# Patient Record
Sex: Female | Born: 1950
Health system: Southern US, Community
[De-identification: ages and names within clinical notes are randomized; demographics above are authoritative.]

## PROBLEM LIST (undated history)

## (undated) DIAGNOSIS — O009 Unspecified ectopic pregnancy without intrauterine pregnancy: Secondary | ICD-10-CM

## (undated) DIAGNOSIS — R569 Unspecified convulsions: Secondary | ICD-10-CM

## (undated) DIAGNOSIS — F419 Anxiety disorder, unspecified: Secondary | ICD-10-CM

## (undated) DIAGNOSIS — S62609A Fracture of unspecified phalanx of unspecified finger, initial encounter for closed fracture: Secondary | ICD-10-CM

## (undated) DIAGNOSIS — F32A Depression, unspecified: Secondary | ICD-10-CM

## (undated) DIAGNOSIS — B1920 Unspecified viral hepatitis C without hepatic coma: Secondary | ICD-10-CM

## (undated) DIAGNOSIS — K219 Gastro-esophageal reflux disease without esophagitis: Secondary | ICD-10-CM

## (undated) DIAGNOSIS — I10 Essential (primary) hypertension: Secondary | ICD-10-CM

## (undated) DIAGNOSIS — D649 Anemia, unspecified: Secondary | ICD-10-CM

## (undated) DIAGNOSIS — J309 Allergic rhinitis, unspecified: Secondary | ICD-10-CM

## (undated) DIAGNOSIS — Z5189 Encounter for other specified aftercare: Secondary | ICD-10-CM

## (undated) DIAGNOSIS — E039 Hypothyroidism, unspecified: Secondary | ICD-10-CM

## (undated) HISTORY — DX: Anemia, unspecified: D64.9

## (undated) HISTORY — PX: APPENDECTOMY: SHX54

## (undated) HISTORY — PX: CHOLECYSTECTOMY: SHX55

## (undated) HISTORY — PX: BRAIN SURGERY: SHX531

## (undated) HISTORY — DX: Gastro-esophageal reflux disease without esophagitis: K21.9

## (undated) HISTORY — PX: BREAST ENHANCEMENT SURGERY: SHX7

## (undated) HISTORY — DX: Encounter for other specified aftercare: Z51.89

## (undated) HISTORY — PX: TONSILLECTOMY AND ADENOIDECTOMY: SUR1326

## (undated) HISTORY — PX: ECTOPIC PREGNANCY SURGERY: SHX613

## (undated) HISTORY — DX: Allergic rhinitis, unspecified: J30.9

## (undated) HISTORY — DX: Essential (primary) hypertension: I10

## (undated) HISTORY — DX: Unspecified viral hepatitis C without hepatic coma: B19.20

## (undated) HISTORY — PX: LUMBAR SPINE SURGERY: SHX701

---

## 1998-02-25 ENCOUNTER — Other Ambulatory Visit: Admission: RE | Admit: 1998-02-25 | Discharge: 1998-02-25 | Payer: Self-pay | Admitting: Obstetrics and Gynecology

## 1999-06-25 ENCOUNTER — Emergency Department (HOSPITAL_COMMUNITY): Admission: EM | Admit: 1999-06-25 | Discharge: 1999-06-25 | Payer: Self-pay | Admitting: Emergency Medicine

## 2000-06-04 ENCOUNTER — Encounter: Payer: Self-pay | Admitting: Internal Medicine

## 2000-06-04 ENCOUNTER — Encounter: Admission: RE | Admit: 2000-06-04 | Discharge: 2000-06-04 | Payer: Self-pay | Admitting: Internal Medicine

## 2011-03-31 ENCOUNTER — Other Ambulatory Visit: Payer: Self-pay | Admitting: Family Medicine

## 2011-03-31 DIAGNOSIS — Z780 Asymptomatic menopausal state: Secondary | ICD-10-CM

## 2011-03-31 DIAGNOSIS — Z1231 Encounter for screening mammogram for malignant neoplasm of breast: Secondary | ICD-10-CM

## 2011-04-09 ENCOUNTER — Ambulatory Visit
Admission: RE | Admit: 2011-04-09 | Discharge: 2011-04-09 | Disposition: A | Discharge status: Home / Self Care | Payer: Medicaid Other | Source: Ambulatory Visit | Attending: Family Medicine | Admitting: Family Medicine

## 2011-04-09 ENCOUNTER — Other Ambulatory Visit: Payer: Self-pay | Admitting: Family Medicine

## 2011-04-09 DIAGNOSIS — Z780 Asymptomatic menopausal state: Secondary | ICD-10-CM

## 2011-04-09 DIAGNOSIS — Z1231 Encounter for screening mammogram for malignant neoplasm of breast: Secondary | ICD-10-CM

## 2013-02-21 ENCOUNTER — Telehealth: Payer: Self-pay | Admitting: Family Medicine

## 2013-02-21 DIAGNOSIS — R569 Unspecified convulsions: Secondary | ICD-10-CM

## 2013-02-21 MED ORDER — CARBAMAZEPINE ER 200 MG PO CP12: 200.0000 mg | ORAL_CAPSULE | Freq: Two times a day (BID) | ORAL | Status: DC

## 2013-02-21 NOTE — Telephone Encounter (Signed)
Medication refilled per protocol.Patient needs to be seen before any further refills 

## 2013-02-27 ENCOUNTER — Telehealth: Payer: Self-pay | Admitting: Family Medicine

## 2013-02-27 MED ORDER — FLUTICASONE PROPIONATE 50 MCG/ACT NA SUSP: 2.0000 | Freq: Every day | NASAL | Status: DC

## 2013-02-27 NOTE — Telephone Encounter (Signed)
rx refilled.

## 2013-03-08 ENCOUNTER — Ambulatory Visit (INDEPENDENT_AMBULATORY_CARE_PROVIDER_SITE_OTHER): Payer: Medicare Other | Admitting: Family Medicine

## 2013-03-08 ENCOUNTER — Encounter: Payer: Self-pay | Admitting: Family Medicine

## 2013-03-08 VITALS — BP 136/82 | HR 86 | Temp 98.2°F | Resp 18 | Wt 128.0 lb

## 2013-03-08 DIAGNOSIS — B182 Chronic viral hepatitis C: Secondary | ICD-10-CM | POA: Insufficient documentation

## 2013-03-08 DIAGNOSIS — J40 Bronchitis, not specified as acute or chronic: Secondary | ICD-10-CM

## 2013-03-08 DIAGNOSIS — I10 Essential (primary) hypertension: Secondary | ICD-10-CM | POA: Insufficient documentation

## 2013-03-08 MED ORDER — ALPRAZOLAM 0.25 MG PO TABS: 0.2500 mg | ORAL_TABLET | Freq: Every evening | ORAL | Status: DC | PRN

## 2013-03-08 MED ORDER — LEVOFLOXACIN 500 MG PO TABS: 500.0000 mg | ORAL_TABLET | Freq: Every day | ORAL | Status: DC

## 2013-03-08 NOTE — Progress Notes (Signed)
Subjective:     Patient ID: Catherine Gonzalez, female   DOB: [birth year], 62 y.o.   MRN: [id number]  HPI  Patient reports 2 weeks of worsening cough productive of green sputum. She is reporting increasing shortness of breath, pleurisy, fevers chills generalized malaise and fatigue.  She denies wheezing. She denies angina. She smokes one pack of cigarettes per day. She has no history of COPD. Past Medical History  Diagnosis Date  . Hepatitis C   . Hypertension    Past medical history is also significant for seizure disorder Current Outpatient Prescriptions on File Prior to Visit  Medication Sig Dispense Refill  . amLODipine-olmesartan (AZOR) 5-20 MG per tablet Take 1 tablet by mouth daily.      . carbamazepine (CARBATROL) 200 MG 12 hr capsule Take 1 capsule (200 mg total) by mouth 2 (two) times daily. Takes two tabs BID  120 capsule  0  . fluticasone (FLONASE) 50 MCG/ACT nasal spray Place 2 sprays into the nose daily.  16 g  11  . levETIRAcetam (KEPPRA) 750 MG tablet Take 750 mg by mouth every 12 (twelve) hours.       No current facility-administered medications on file prior to visit.    Review of Systems    review of systems is otherwise negative Objective:   Physical Exam  Constitutional: She appears well-developed and well-nourished.  HENT:  Head: Normocephalic.  Right Ear: External ear normal.  Left Ear: External ear normal.  Eyes: Conjunctivae and EOM are normal. Pupils are equal, round, and reactive to light.  Neck: Normal range of motion. No JVD present.  Cardiovascular: Normal rate, regular rhythm and normal heart sounds.   No murmur heard. Pulmonary/Chest: Effort normal and breath sounds normal. No respiratory distress. She has no wheezes. She has no rales.  Lymphadenopathy:    She has no cervical adenopathy.       Assessment:     Bronchitis in a smoker.    Plan:    encourage smoking cessation. Begin Levaquin 500 mg by mouth daily for 7 days followup in one week  if no better. Return immediately if worsening.

## 2013-03-15 ENCOUNTER — Other Ambulatory Visit: Payer: Self-pay | Admitting: Family Medicine

## 2013-03-15 ENCOUNTER — Telehealth: Payer: Self-pay | Admitting: Family Medicine

## 2013-03-15 NOTE — Telephone Encounter (Signed)
Pt wants to know if she can get another dose of Levofloxocin 500mg , says feels a little bit better but thinks if she gets 5 more pills it may not it out. Can we call in another dose?

## 2013-03-16 ENCOUNTER — Other Ambulatory Visit: Payer: Self-pay | Admitting: Family Medicine

## 2013-03-16 MED ORDER — LEVOFLOXACIN 500 MG PO TABS: 500.0000 mg | ORAL_TABLET | Freq: Every day | ORAL | Status: DC

## 2013-03-16 NOTE — Telephone Encounter (Signed)
Script rf and pt aware

## 2013-03-16 NOTE — Telephone Encounter (Signed)
Please call out 5 additional days.

## 2013-03-16 NOTE — Telephone Encounter (Addendum)
Refilled script as requested and pt aware

## 2013-03-16 NOTE — Addendum Note (Signed)
Addended by: Elvina Mattes T on: 03/16/2013 12:09 PM   Modules accepted: Orders

## 2013-03-19 ENCOUNTER — Emergency Department (HOSPITAL_COMMUNITY): Payer: Medicare Other

## 2013-03-19 ENCOUNTER — Emergency Department (HOSPITAL_COMMUNITY)
Admission: EM | Admit: 2013-03-19 | Discharge: 2013-03-19 | Disposition: A | Discharge status: Home / Self Care | Payer: Medicare Other | Source: Home / Self Care | Attending: Emergency Medicine | Admitting: Emergency Medicine

## 2013-03-19 ENCOUNTER — Encounter (HOSPITAL_COMMUNITY): Payer: Self-pay | Admitting: *Deleted

## 2013-03-19 ENCOUNTER — Other Ambulatory Visit: Payer: Self-pay

## 2013-03-19 DIAGNOSIS — F17200 Nicotine dependence, unspecified, uncomplicated: Secondary | ICD-10-CM | POA: Insufficient documentation

## 2013-03-19 DIAGNOSIS — Z9889 Other specified postprocedural states: Secondary | ICD-10-CM | POA: Insufficient documentation

## 2013-03-19 DIAGNOSIS — R42 Dizziness and giddiness: Secondary | ICD-10-CM | POA: Insufficient documentation

## 2013-03-19 DIAGNOSIS — I10 Essential (primary) hypertension: Secondary | ICD-10-CM | POA: Insufficient documentation

## 2013-03-19 DIAGNOSIS — Z8619 Personal history of other infectious and parasitic diseases: Secondary | ICD-10-CM | POA: Insufficient documentation

## 2013-03-19 DIAGNOSIS — Z79899 Other long term (current) drug therapy: Secondary | ICD-10-CM | POA: Insufficient documentation

## 2013-03-19 DIAGNOSIS — Z9089 Acquired absence of other organs: Secondary | ICD-10-CM | POA: Insufficient documentation

## 2013-03-19 DIAGNOSIS — G40909 Epilepsy, unspecified, not intractable, without status epilepticus: Secondary | ICD-10-CM | POA: Insufficient documentation

## 2013-03-19 DIAGNOSIS — R109 Unspecified abdominal pain: Secondary | ICD-10-CM | POA: Insufficient documentation

## 2013-03-19 DIAGNOSIS — IMO0002 Reserved for concepts with insufficient information to code with codable children: Secondary | ICD-10-CM | POA: Insufficient documentation

## 2013-03-19 DIAGNOSIS — R55 Syncope and collapse: Secondary | ICD-10-CM | POA: Insufficient documentation

## 2013-03-19 DIAGNOSIS — R300 Dysuria: Secondary | ICD-10-CM | POA: Insufficient documentation

## 2013-03-19 HISTORY — DX: Unspecified ectopic pregnancy without intrauterine pregnancy: O00.90

## 2013-03-19 HISTORY — DX: Unspecified convulsions: R56.9

## 2013-03-19 LAB — CBC WITH DIFFERENTIAL/PLATELET
Basophils Absolute: 0 10*3/uL (ref 0.0–0.1)
Eosinophils Relative: 1 % (ref 0–5)
Lymphocytes Relative: 38 % (ref 12–46)
Lymphs Abs: 1.3 10*3/uL (ref 0.7–4.0)
MCV: 88.9 fL (ref 78.0–100.0)
Neutrophils Relative %: 39 % — ABNORMAL LOW (ref 43–77)
Platelets: 204 10*3/uL (ref 150–400)
RBC: 3.51 MIL/uL — ABNORMAL LOW (ref 3.87–5.11)
RDW: 13.8 % (ref 11.5–15.5)
WBC: 3.5 10*3/uL — ABNORMAL LOW (ref 4.0–10.5)

## 2013-03-19 LAB — URINALYSIS, ROUTINE W REFLEX MICROSCOPIC
Ketones, ur: NEGATIVE mg/dL
Leukocytes, UA: NEGATIVE
Nitrite: NEGATIVE
Protein, ur: NEGATIVE mg/dL
pH: 7 (ref 5.0–8.0)

## 2013-03-19 LAB — POCT I-STAT, CHEM 8
Calcium, Ion: 1.12 mmol/L (ref 1.12–1.23)
Glucose, Bld: 108 mg/dL — ABNORMAL HIGH (ref 70–99)
HCT: 33 % — ABNORMAL LOW (ref 36.0–46.0)
Hemoglobin: 11.2 g/dL — ABNORMAL LOW (ref 12.0–15.0)
Potassium: 3.9 mEq/L (ref 3.5–5.1)

## 2013-03-19 LAB — MAGNESIUM: Magnesium: 1.8 mg/dL (ref 1.5–2.5)

## 2013-03-19 MED ORDER — ONDANSETRON HCL 4 MG/2ML IJ SOLN
4.0000 mg | Freq: Once | INTRAMUSCULAR | Status: AC
  Administered 2013-03-19: 4 mg via INTRAVENOUS
  Filled 2013-03-19: qty 2

## 2013-03-19 MED ORDER — SODIUM CHLORIDE 0.9 % IV BOLUS (SEPSIS)
1000.0000 mL | Freq: Once | INTRAVENOUS | Status: AC
  Administered 2013-03-19: 1000 mL via INTRAVENOUS

## 2013-03-19 MED ORDER — IOHEXOL 300 MG/ML  SOLN
50.0000 mL | Freq: Once | INTRAMUSCULAR | Status: AC | PRN
  Administered 2013-03-19: 50 mL via ORAL

## 2013-03-19 MED ORDER — SODIUM CHLORIDE 0.9 % IV SOLN
Freq: Once | INTRAVENOUS | Status: AC
  Administered 2013-03-19: 13:00:00 via INTRAVENOUS

## 2013-03-19 MED ORDER — IOHEXOL 300 MG/ML  SOLN
100.0000 mL | Freq: Once | INTRAMUSCULAR | Status: AC | PRN
  Administered 2013-03-19: 100 mL via INTRAVENOUS

## 2013-03-19 MED ORDER — ONDANSETRON 8 MG PO TBDP: 8.0000 mg | ORAL_TABLET | Freq: Three times a day (TID) | ORAL | Status: DC | PRN

## 2013-03-19 MED ORDER — ACETAMINOPHEN-CODEINE #3 300-30 MG PO TABS: 1.0000 | ORAL_TABLET | Freq: Four times a day (QID) | ORAL | Status: DC | PRN

## 2013-03-19 NOTE — ED Notes (Signed)
Patient unable to void at this time

## 2013-03-19 NOTE — ED Notes (Signed)
Pt drinking oral contrast. Tolerating well.  

## 2013-03-19 NOTE — ED Notes (Signed)
Pt reports Bronchitis 3 wks ago, being prescribed antibiotic which she is continuing to take; pain has progressed from chest down to ABD; pt reports being diagnosed w/ hepatitis C 1 year ago; pt reports pain and burning on urination;

## 2013-03-19 NOTE — ED Notes (Signed)
Returned from CT.

## 2013-03-19 NOTE — ED Notes (Signed)
MD at bedside. 

## 2013-03-19 NOTE — ED Provider Notes (Addendum)
History     CSN: [id number]  Arrival date & time 03/19/13  1130   First MD Initiated Contact with Patient 03/19/13 1137      Chief Complaint  Patient presents with  . Abdominal Pain  . Loss of Consciousness    (Consider location/radiation/quality/duration/timing/severity/associated sxs/prior treatment) HPI Comments: Pt with hx of hepc, htn, seizures comes in with cc of abd pain. Pt has abd pain in the lower quadrants x 3 weeks, non radiating. No associated n/v/f/c/diarrhea. Pt does have some dysuria x 3 weeks, but no hematuria, polyuria. Pt denies any hx of renal stones. No hx of PUD. No hx of same complains in the past. Does endorse to smoking at least 1 ppd since teens.  Pt also had a near syncopal episode x 2 on Friday. She got up, and just "went down" without losing consciousness. No palpitations, no chest pains, dib prior to those sx. No blood loss. No hx of CAD. No hx of PE, substance abuse.  Patient is a 62 y.o. female presenting with abdominal pain and syncope. The history is provided by the patient.  Abdominal Pain Associated symptoms: no chest pain, no dysuria, no fever, no nausea, no shortness of breath and no vomiting   Loss of Consciousness  Associated symptoms include abdominal pain and dizziness. Pertinent negatives include chest pain, confusion, fever, headaches, nausea, vomiting and weakness.    Past Medical History  Diagnosis Date  . Hepatitis C   . Hypertension   . Seizures   . Ectopic pregnancy     Past Surgical History  Procedure Laterality Date  . Brain surgery    . Appendectomy    . Cholecystectomy    . Back surgery      Family History  Problem Relation Age of Onset  . Heart failure Mother   . Cancer Father     History  Substance Use Topics  . Smoking status: Current Every Day Smoker -- 1.00 packs/day    Types: Cigarettes  . Smokeless tobacco: Not on file  . Alcohol Use: No    OB History   Grav Para Term Preterm Abortions TAB SAB Ect  Mult Living   1    1   1         Review of Systems  Constitutional: Negative for fever and activity change.  HENT: Negative for facial swelling and neck pain.   Respiratory: Negative for shortness of breath and wheezing.   Cardiovascular: Positive for syncope. Negative for chest pain.  Gastrointestinal: Positive for abdominal pain. Negative for nausea, vomiting, blood in stool and abdominal distention.  Genitourinary: Negative for dysuria and difficulty urinating.  Skin: Negative for color change.  Neurological: Positive for dizziness. Negative for syncope, speech difficulty, weakness and headaches.  Hematological: Does not bruise/bleed easily.  Psychiatric/Behavioral: Negative for confusion.    Allergies  Review of patient's allergies indicates no known allergies.  Home Medications   Current Outpatient Rx  Name  Route  Sig  Dispense  Refill  . ALPRAZolam (XANAX) 0.25 MG tablet   Oral   Take 1 tablet (0.25 mg total) by mouth at bedtime as needed for sleep.   20 tablet   0   . amLODipine-olmesartan (AZOR) 5-20 MG per tablet   Oral   Take 1 tablet by mouth daily.         . Boceprevir 200 MG CAPS   Oral   Take 4 capsules by mouth 3 (three) times daily.          Marland Kitchen  carbamazepine (CARBATROL) 200 MG 12 hr capsule   Oral   Take 1 capsule (200 mg total) by mouth 2 (two) times daily. Takes two tabs BID   120 capsule   0     Patient needs to be seen before any further refill ...   . fluticasone (FLONASE) 50 MCG/ACT nasal spray   Nasal   Place 2 sprays into the nose daily as needed for allergies.         Marland Kitchen levETIRAcetam (KEPPRA) 750 MG tablet   Oral   Take 750 mg by mouth every 12 (twelve) hours.         Marland Kitchen levofloxacin (LEVAQUIN) 500 MG tablet   Oral   Take 1 tablet (500 mg total) by mouth daily.   5 tablet   0   . peginterferon alfa-2a (PEGASYS) 180 MCG/ML injection   Subcutaneous   Inject 180 mcg into the skin every 7 (seven) days.         . ribavirin  (RIBATAB) 400 MG tablet   Oral   Take 400 mg by mouth 2 (two) times daily.            BP 121/75  Pulse 81  Temp(Src) 98.8 F (37.1 C) (Oral)  Ht 5\' 6"  (1.676 m)  Wt 120 lb (54.432 kg)  BMI 19.38 kg/m2  SpO2 98%  Physical Exam  Nursing note and vitals reviewed. Constitutional: She is oriented to person, place, and time. She appears well-developed and well-nourished.  HENT:  Head: Normocephalic and atraumatic.  Eyes: EOM are normal. Pupils are equal, round, and reactive to light.  Neck: Neck supple.  Cardiovascular: Normal rate, regular rhythm and normal heart sounds.   No murmur heard. Pulmonary/Chest: Effort normal. No respiratory distress.  Abdominal: Soft. She exhibits no distension and no mass. There is tenderness. There is no rebound and no guarding.  Diffuse lower quadrant tenderness  Neurological: She is alert and oriented to person, place, and time.  Skin: Skin is warm and dry.    ED Course  Procedures (including critical care time)  Labs Reviewed  CBC WITH DIFFERENTIAL - Abnormal; Notable for the following:    WBC 3.5 (*)    RBC 3.51 (*)    Hemoglobin 10.8 (*)    HCT 31.2 (*)    Neutrophils Relative 39 (*)    Neutro Abs 1.4 (*)    Monocytes Relative 22 (*)    All other components within normal limits  POCT I-STAT, CHEM 8 - Abnormal; Notable for the following:    Sodium 129 (*)    Chloride 93 (*)    BUN 5 (*)    Glucose, Bld 108 (*)    Hemoglobin 11.2 (*)    HCT 33.0 (*)    All other components within normal limits  MAGNESIUM  URINALYSIS, ROUTINE W REFLEX MICROSCOPIC   No results found.   No diagnosis found.    MDM   Pt comes in with cc of lower quadrant abd pain.  DDx includes: Pancreatitis Hepatobiliary pathology including cholecystitis Gastritis/PUD SBO ACS syndrome Aortic Dissection AAA Tumors Colitis Intra abdominal abscess Thrombosis Mesenteric  ischemia Diverticulitis Peritonitis Appendicitis Hernia Nephrolithiasis Pyelonephritis UTI/Cystitis  Pt comes in with abd pain, diffuse x 3 weeks, and near syncope.  Plan is to r/o AAA. Pt has extensive smoking hx. Pt has no peritoneal findings, but CT will be ordered to r/o any infectious process - and also screen for AAA in the process.  The near syncope - appears to  be orthostatic. The episode was on Friday, no repeat offense. No premature CAd in the family and she has no hx of CHF. We will get a screening EKG. Although she comes beyond 24 hours of her presentation  - and is not a candidate for SF syncope rule - she would have been discharged based on that criteria as well.    Date: 03/19/2013  Rate: 83  Rhythm: normal sinus rhythm  QRS Axis: normal  Intervals: normal  ST/T Wave abnormalities: nonspecific ST/T changes  Conduction Disutrbances:none  Narrative Interpretation:   Old EKG Reviewed: none available        Derwood Kaplan, MD 03/19/13 1510  Derwood Kaplan, MD 03/19/13 1512

## 2013-03-20 ENCOUNTER — Emergency Department (HOSPITAL_COMMUNITY): Payer: Medicare Other

## 2013-03-20 ENCOUNTER — Inpatient Hospital Stay (HOSPITAL_COMMUNITY)
Admission: EM | Admit: 2013-03-20 | Discharge: 2013-03-21 | DRG: 312 | Disposition: A | Discharge status: Home / Self Care | Payer: Medicare Other | Attending: Internal Medicine | Admitting: Internal Medicine

## 2013-03-20 ENCOUNTER — Inpatient Hospital Stay (HOSPITAL_COMMUNITY)
Admit: 2013-03-20 | Discharge: 2013-03-20 | Disposition: A | Discharge status: Home / Self Care | Payer: Medicare Other | Attending: Internal Medicine | Admitting: Internal Medicine

## 2013-03-20 ENCOUNTER — Encounter (HOSPITAL_COMMUNITY): Payer: Self-pay | Admitting: *Deleted

## 2013-03-20 DIAGNOSIS — E876 Hypokalemia: Secondary | ICD-10-CM | POA: Diagnosis present

## 2013-03-20 DIAGNOSIS — R55 Syncope and collapse: Principal | ICD-10-CM | POA: Diagnosis present

## 2013-03-20 DIAGNOSIS — F411 Generalized anxiety disorder: Secondary | ICD-10-CM | POA: Diagnosis present

## 2013-03-20 DIAGNOSIS — E871 Hypo-osmolality and hyponatremia: Secondary | ICD-10-CM | POA: Diagnosis present

## 2013-03-20 DIAGNOSIS — R9401 Abnormal electroencephalogram [EEG]: Secondary | ICD-10-CM | POA: Diagnosis present

## 2013-03-20 DIAGNOSIS — G40909 Epilepsy, unspecified, not intractable, without status epilepticus: Secondary | ICD-10-CM | POA: Diagnosis present

## 2013-03-20 DIAGNOSIS — I10 Essential (primary) hypertension: Secondary | ICD-10-CM | POA: Diagnosis present

## 2013-03-20 DIAGNOSIS — B182 Chronic viral hepatitis C: Secondary | ICD-10-CM | POA: Diagnosis present

## 2013-03-20 LAB — CBC WITH DIFFERENTIAL/PLATELET
Basophils Relative: 0 % (ref 0–1)
Hemoglobin: 10.1 g/dL — ABNORMAL LOW (ref 12.0–15.0)
Lymphs Abs: 1.1 10*3/uL (ref 0.7–4.0)
MCHC: 35.1 g/dL (ref 30.0–36.0)
Monocytes Relative: 16 % — ABNORMAL HIGH (ref 3–12)
Neutro Abs: 1.6 10*3/uL — ABNORMAL LOW (ref 1.7–7.7)
Neutrophils Relative %: 49 % (ref 43–77)
RBC: 3.24 MIL/uL — ABNORMAL LOW (ref 3.87–5.11)

## 2013-03-20 LAB — POCT I-STAT TROPONIN I: Troponin i, poc: 0 ng/mL (ref 0.00–0.08)

## 2013-03-20 LAB — RAPID URINE DRUG SCREEN, HOSP PERFORMED
Opiates: POSITIVE — AB
Tetrahydrocannabinol: NOT DETECTED

## 2013-03-20 LAB — BASIC METABOLIC PANEL
BUN: 5 mg/dL — ABNORMAL LOW (ref 6–23)
BUN: 4 mg/dL — ABNORMAL LOW (ref 6–23)
Chloride: 92 mEq/L — ABNORMAL LOW (ref 96–112)
Chloride: 97 mEq/L (ref 96–112)
Creatinine, Ser: 0.58 mg/dL (ref 0.50–1.10)
GFR calc Af Amer: >90 mL/min (ref >90)
GFR calc Af Amer: >90 mL/min (ref >90)
GFR calc non Af Amer: >90 mL/min (ref >90)
Potassium: 3.4 mEq/L — ABNORMAL LOW (ref 3.5–5.1)
Potassium: 3.6 mEq/L (ref 3.5–5.1)

## 2013-03-20 LAB — HEPATIC FUNCTION PANEL
ALT: 28 U/L (ref 0–35)
Albumin: 3.7 g/dL (ref 3.5–5.2)
Bilirubin, Direct: <0.1 mg/dL (ref 0.0–0.3)
Total Bilirubin: 0.3 mg/dL (ref 0.3–1.2)
Total Protein: 6.9 g/dL (ref 6.0–8.3)

## 2013-03-20 LAB — CARBAMAZEPINE LEVEL, TOTAL: Carbamazepine Lvl: 8.3 ug/mL (ref 4.0–12.0)

## 2013-03-20 LAB — URINALYSIS, ROUTINE W REFLEX MICROSCOPIC
Bilirubin Urine: NEGATIVE
Nitrite: NEGATIVE
Specific Gravity, Urine: 1.016 (ref 1.005–1.030)
pH: 7 (ref 5.0–8.0)

## 2013-03-20 LAB — TSH: TSH: 33.823 u[IU]/mL — ABNORMAL HIGH (ref 0.350–4.500)

## 2013-03-20 LAB — OCCULT BLOOD, POC DEVICE: Fecal Occult Bld: NEGATIVE

## 2013-03-20 LAB — LIPASE, BLOOD: Lipase: 23 U/L (ref 11–59)

## 2013-03-20 LAB — URINE MICROSCOPIC-ADD ON

## 2013-03-20 LAB — OSMOLALITY: Osmolality: 265 mOsm/kg — ABNORMAL LOW (ref 275–300)

## 2013-03-20 LAB — OSMOLALITY, URINE: Osmolality, Ur: 301 mOsm/kg — ABNORMAL LOW (ref 390–1090)

## 2013-03-20 LAB — TROPONIN I: Troponin I: <0.3 ng/mL (ref <0.30)

## 2013-03-20 LAB — MAGNESIUM: Magnesium: 1.9 mg/dL (ref 1.5–2.5)

## 2013-03-20 MED ORDER — SODIUM CHLORIDE 0.9 % IV BOLUS (SEPSIS)
1000.0000 mL | Freq: Once | INTRAVENOUS | Status: AC
  Administered 2013-03-20: 1000 mL via INTRAVENOUS

## 2013-03-20 MED ORDER — ALPRAZOLAM 0.25 MG PO TABS
0.2500 mg | ORAL_TABLET | Freq: Every evening | ORAL | Status: DC | PRN
  Administered 2013-03-20: 0.25 mg via ORAL
  Filled 2013-03-20: qty 1

## 2013-03-20 MED ORDER — BOCEPREVIR 200 MG PO CAPS
4.0000 | ORAL_CAPSULE | Freq: Three times a day (TID) | ORAL | Status: DC
  Administered 2013-03-20 – 2013-03-21 (×3): 800 mg via ORAL

## 2013-03-20 MED ORDER — ENOXAPARIN SODIUM 40 MG/0.4ML ~~LOC~~ SOLN
40.0000 mg | SUBCUTANEOUS | Status: DC
  Administered 2013-03-20: 40 mg via SUBCUTANEOUS
  Filled 2013-03-20 (×2): qty 0.4

## 2013-03-20 MED ORDER — ACETAMINOPHEN 325 MG PO TABS: 650.0000 mg | ORAL_TABLET | Freq: Four times a day (QID) | ORAL | Status: DC | PRN

## 2013-03-20 MED ORDER — ONDANSETRON HCL 4 MG/2ML IJ SOLN
4.0000 mg | Freq: Four times a day (QID) | INTRAMUSCULAR | Status: DC | PRN
  Administered 2013-03-20: 4 mg via INTRAVENOUS
  Filled 2013-03-20: qty 2

## 2013-03-20 MED ORDER — ONDANSETRON HCL 4 MG/2ML IJ SOLN
4.0000 mg | Freq: Once | INTRAMUSCULAR | Status: AC
  Administered 2013-03-20: 4 mg via INTRAVENOUS
  Filled 2013-03-20: qty 2

## 2013-03-20 MED ORDER — RIBAVIRIN 400 MG PO TABS: 400.0000 mg | ORAL_TABLET | Freq: Two times a day (BID) | ORAL | Status: DC

## 2013-03-20 MED ORDER — POTASSIUM CHLORIDE CRYS ER 20 MEQ PO TBCR
20.0000 meq | EXTENDED_RELEASE_TABLET | Freq: Once | ORAL | Status: AC
  Administered 2013-03-20: 20 meq via ORAL
  Filled 2013-03-20: qty 1

## 2013-03-20 MED ORDER — ACETAMINOPHEN 650 MG RE SUPP: 650.0000 mg | Freq: Four times a day (QID) | RECTAL | Status: DC | PRN

## 2013-03-20 MED ORDER — SODIUM CHLORIDE 0.9 % IJ SOLN: 3.0000 mL | Freq: Two times a day (BID) | INTRAMUSCULAR | Status: DC

## 2013-03-20 MED ORDER — SODIUM CHLORIDE 0.9 % IV SOLN
INTRAVENOUS | Status: DC
  Administered 2013-03-20 (×2): via INTRAVENOUS

## 2013-03-20 MED ORDER — DOCUSATE SODIUM 100 MG PO CAPS
100.0000 mg | ORAL_CAPSULE | Freq: Two times a day (BID) | ORAL | Status: DC
  Administered 2013-03-20 – 2013-03-21 (×3): 100 mg via ORAL
  Filled 2013-03-20 (×4): qty 1

## 2013-03-20 MED ORDER — SENNA 8.6 MG PO TABS
2.0000 | ORAL_TABLET | Freq: Every day | ORAL | Status: DC
  Administered 2013-03-20: 17.2 mg via ORAL
  Filled 2013-03-20: qty 2

## 2013-03-20 MED ORDER — LEVETIRACETAM 750 MG PO TABS
750.0000 mg | ORAL_TABLET | Freq: Two times a day (BID) | ORAL | Status: DC
  Administered 2013-03-20 – 2013-03-21 (×3): 750 mg via ORAL
  Filled 2013-03-20 (×4): qty 1

## 2013-03-20 MED ORDER — LEVOFLOXACIN 500 MG PO TABS
500.0000 mg | ORAL_TABLET | Freq: Once | ORAL | Status: AC
  Administered 2013-03-20: 500 mg via ORAL
  Filled 2013-03-20: qty 1

## 2013-03-20 MED ORDER — RIBAVIRIN 200 MG PO CAPS
400.0000 mg | ORAL_CAPSULE | Freq: Two times a day (BID) | ORAL | Status: DC
  Administered 2013-03-20 – 2013-03-21 (×3): 400 mg via ORAL
  Filled 2013-03-20 (×4): qty 2

## 2013-03-20 MED ORDER — ONDANSETRON HCL 4 MG PO TABS: 4.0000 mg | ORAL_TABLET | Freq: Four times a day (QID) | ORAL | Status: DC | PRN

## 2013-03-20 NOTE — Progress Notes (Signed)
   CARE MANAGEMENT NOTE 03/20/2013  Patient:  Catherine Gonzalez,Catherine Gonzalez   Account Number:  [id number]  Date Initiated:  03/20/2013  Documentation initiated by:  Jiles Crocker  Subjective/Objective Assessment:   ADMITTED WITH SYNCOPAL EPISODE     Action/Plan:   PCP: Leo Grosser, MD  LIVES AT HOME WITH SPOUSE; CM FOLLOWING FOR DCP   Anticipated DC Date:  03/23/2013   Anticipated DC Plan:  HOME/SELF CARE      DC Planning Services  CM consult          Status of service:  In process, will continue to follow Medicare Important Message given?  NA - LOS <3 / Initial given by admissions (If response is "NO", the following Medicare IM given date fields will be blank) Per UR Regulation:  Reviewed for med. necessity/level of care/duration of stay Comments:  03/20/2013- B Aloys Hupfer RN,BSN,MHA

## 2013-03-20 NOTE — ED Notes (Signed)
Per pt report: pt reports having 3 syncopal episodes in the last 2 days.  Family reports syncopal episodes lasting only about a minute or two. Pt reports coming to the hospital yesterday to get evaluated for abd pain.  Pt was told she didn't have a bladder infection and sent home.  Pt currently reports feeling short of breath and having a headache rating a 8/10.  Pt also reports having a 3/10 chest pain that is located in her central chest and increases with deep breathing.  Pain in chest does not radiate.

## 2013-03-20 NOTE — Procedures (Signed)
History: 62 yo F with h/o right tumor resection and   Sedation:   Background: The posterior dominant rhythm is slightly disorganized and asymetric, seen on the left but not right. It achieves a maximal frequency of 8 Hz. There is mild generalized theta and delta activity dring wakefullness, and in addtion, more prominent delta over the right side(most prominent at T8, P8). There are occasional sharp waves at C4 > P4.  Photic stimulation: Physiologic driving is absent  EEG Abnormalities: 1) Occasional sharp waves at C4 > P4.  2) Irregular delta activity on the right posterior quadrant.  3) Mild generalized irregular slow activity.  4) Asymmetric PDR L > R  Clinical Interpretation: This abnormal EEG is consistent with a region of potential epileptogenicity in the right centroparietal region and a focal region of cerebral dysfunction in the right posterior quadrant. There was no seizure recorded.   Ritta Slot, MD [facility] [phone]  If 7pm- 7am, please page neurology on call at [phone].

## 2013-03-20 NOTE — Progress Notes (Signed)
EEG completed.

## 2013-03-20 NOTE — ED Notes (Signed)
Patient transported to X-ray 

## 2013-03-20 NOTE — Progress Notes (Signed)
*  PRELIMINARY RESULTS* Echocardiogram 2D Echocardiogram has been performed.  Catherine Gonzalez 03/20/2013, 1:58 PM

## 2013-03-20 NOTE — H&P (Signed)
[facility] History and Physical  Adryanna Friedt BJY:[id number] DOB: [birth year] DOA: 03/20/2013   PCP: Leo Grosser, MD   Chief Complaint: Syncope  HPI:  62 year old female with a history of hypertension, chronic hepatitis C, seizure disorder, benign brain tumor status post resection 20 years ago, and anxiety presents with a syncopal episode last night as the patient was walking from her bedroom to the bathroom. The patient has been complaining of dizziness for the past 3 weeks primarily with changing of positions from a seated to standing position. The patient stated that she felt dizzy upon standing and walking to the bathroom after which the patient had a witnessed syncopal episode by her husband. There was no tonic-clonic activity, and the patient woke up without any lethargy. The episode lasted less than 1 minute. There was no bowel or bladder incontinence. The patient did not bite her tongue. The patient had a similar episode Friday evening with similar symptoms. The patient has been complaining of intermittent chest discomfort with shortness of breath. The patient was diagnosed with "bronchitis" by her primary care physician approximately 2 weeks ago. The patient was given an initial course of 7 days of antibiotics followed by 5 days of Levaquin of which the patient has her final dose today. She states that her breathing and coughing have significantly improved with antibiotics. However she continues to have some shortness of breath at rest which she attributes to some panic attacks. She denies any worsening abdominal or lower extremity edema, orthopnea. The patient denies any fevers, chills, vomiting, diarrhea, headache, hematochezia, melena. The patient came to the emergency department 03/19/2013 with abdominal pain and sent home with Tylenol #3. CT of the abdomen and pelvis on 03/19/2013 was negative for any acute findings.  Regarding the patient's chronic hepatitis C, the patient  has been taking pegylated interferon for approximately 3 months in addition to ribavirin and boceprevir.  She sees GI physician, Dr. Teena Dunk, in [place]. However, the patient has not been taking her oral hepatitis C medications for the past 2-3 days because of nausea. She did receive her interferon dose last Friday which was 3 days ago.  In the emergency department, the patient's orthostatics were negative. The patient was found to have hyponatremia with a sodium of 125, nonacute CT of the brain, normal EKG, and WBC 3.3. Urinalysis showed 3-6 WBCs. Initial troponin was negative.  Assessment/Plan:  Syncope -Appears to be vagally mediated superimposed upon the patient's hypovolemia--clinically, patient appears dry -Patient notes decreased oral intake for the past 3 weeks -Clinical history and not consistent with seizure -Check TSH -Echocardiogram -Place on telemetry -Urine drug screen -Orthostatics negative in the emergency department -Judicious IV fluids -Cycle troponins Hyponatremia -Likely due to the patient's Tegretol -Since the patient has not had a seizure in nearly 2 years I will discontinue the patient's Tegretol as it is contributing to the patient's worsening hyponatremia--the patient was agreeable with this plan as the patient will continue to be monitored while in the hospital -Continue Keppra -Check Tegretol level -Obtain EEG -Urine osmolality, serum osmolarity -TSH, AM cortisol, lipid panel -recheck Na later today to ensure slow correction Abdominal pain -CT abdomen was nonacute -Suspect a degree of constipation -No bowel movement in 4 days -Trial of stool softeners Chronic hepatitis C -Patient received interferon last Friday, 3 days ago -Continue ribavirin and boceprevir--patient states that she has not taken in 2-3 days Hypertension -Hold Azor for now and monitor Hypokalemia -Replete -Check magnesium  Past Medical History  Diagnosis Date  .  Hepatitis C   . Hypertension   . Seizures   . Ectopic pregnancy    Past Surgical History  Procedure Laterality Date  . Brain surgery    . Appendectomy    . Cholecystectomy    . Back surgery    . Breast surgery     Social History:  reports that she has been smoking Cigarettes.  She has been smoking about 0.50 packs per day. She does not have any smokeless tobacco history on file. She reports that she does not drink alcohol or use illicit drugs.   Family History  Problem Relation Age of Onset  . Heart failure Mother   . Cancer Father      No Known Allergies    Prior to Admission medications   Medication Sig Start Date End Date Taking? Authorizing Provider  acetaminophen-codeine (TYLENOL #3) 300-30 MG per tablet Take 1-2 tablets by mouth every 6 (six) hours as needed for pain. 03/19/13  Yes Derwood Kaplan, MD  ALPRAZolam (XANAX) 0.25 MG tablet Take 1 tablet (0.25 mg total) by mouth at bedtime as needed for sleep. 03/08/13  Yes Donita Brooks, MD  amLODipine-olmesartan (AZOR) 5-20 MG per tablet Take 1 tablet by mouth daily.   Yes Historical Provider, MD  Boceprevir 200 MG CAPS Take 4 capsules by mouth 3 (three) times daily.    Yes Historical Provider, MD  carbamazepine (CARBATROL) 200 MG 12 hr capsule Take 1 capsule (200 mg total) by mouth 2 (two) times daily. Takes two tabs BID 02/21/13  Yes Donita Brooks, MD  fluticasone [facility]) 50 MCG/ACT nasal spray Place 2 sprays into the nose daily as needed for allergies. 02/27/13  Yes Donita Brooks, MD  levETIRAcetam (KEPPRA) 750 MG tablet Take 750 mg by mouth every 12 (twelve) hours.   Yes Historical Provider, MD  levofloxacin (LEVAQUIN) 500 MG tablet Take 1 tablet (500 mg total) by mouth daily. 03/16/13  Yes Donita Brooks, MD  ondansetron (ZOFRAN ODT) 8 MG disintegrating tablet Take 1 tablet (8 mg total) by mouth every 8 (eight) hours as needed for nausea. 03/19/13  Yes Ankit Rhunette Croft, MD  peginterferon alfa-2a (PEGASYS) 180 MCG/ML  injection Inject 180 mcg into the skin every 7 (seven) days.   Yes Historical Provider, MD  ribavirin (RIBATAB) 400 MG tablet Take 400 mg by mouth 2 (two) times daily.    Yes Historical Provider, MD    Review of Systems:  Constitutional:  No weight loss, night sweats, Fevers, chills. Head&Eyes: No headache.  No vision loss.  No eye pain or scotoma ENT:  No Difficulty swallowing,Tooth/dental problems,Sore throat,  No ear ache, post nasal drip,  Cardio-vascular:  No Orthopnea, PND, swelling in lower extremities, palpitations  GI:  No  abdominal pain,  vomiting, diarrhea, loss of appetite, hematochezia, melena, heartburn, indigestion, Resp:  Complains of some shortness of breath which has improved with antibiotics recently  No coughing up of blood .No wheezing.No chest wall deformity  Skin:  no rash or lesions.  GU:  nochange in color of urine, no urgency or frequency. No flank pain.  Musculoskeletal:  No joint pain or swelling. No decreased range of motion. No back pain.  Psych:  No change in mood or affect. No depression or anxiety. Neurologic: No headache, no dysesthesia, no focal weakness, no vision loss.   Physical Exam: Filed Vitals:   03/20/13 9528 03/20/13 4132 03/20/13 0644 03/20/13 0814  BP: 130/68 124/80 140/77  129/73  Pulse: 71 72 80 66  Temp:      TempSrc:      Resp:    16  SpO2:    98%   General:  A&O x 3, NAD, nontoxic, pleasant/cooperative Head/Eye: No conjunctival hemorrhage, no icterus, [place]/AT, No nystagmus ENT:  No icterus,  No thrush, , no pharyngeal exudate; upper palate dentures present; dry mucous membranes Neck:  No masses, no lymphadenpathy, no bruits CV:  RRR, no rub, no gallop, no S3 Lung:  CTAB, good air movement, no wheeze, no rhonchi Abdomen: soft/, +BS, nondistended, no peritoneal signs; the floor quite tender to palpation without any peritoneal signs or guarding Ext: No cyanosis, No rashes, No petechiae, No lymphangitis, No edema Neuro:  CNII-XII intact, strength 4/5 in left upper and lower extremities, strength 5/5 right upper and right lower extremity no dysmetria  Labs on Admission:  Basic Metabolic Panel:  Recent Labs Lab 03/19/13 1211 03/19/13 1223 03/20/13 0709  NA  --  129* 125*  K  --  3.9 3.4*  CL  --  93* 92*  CO2  --   --  28  GLUCOSE  --  108* 99  BUN  --  5* 5*  CREATININE  --  0.80 0.59  CALCIUM  --   --  8.7  MG 1.8  --   --    Liver Function Tests:  Recent Labs Lab 03/20/13 0709  AST 43*  ALT 28  ALKPHOS 87  BILITOT 0.3  PROT 6.9  ALBUMIN 3.7    Recent Labs Lab 03/20/13 0709  LIPASE 23   No results found for this basename: AMMONIA,  in the last 168 hours CBC:  Recent Labs Lab 03/19/13 1211 03/19/13 1223 03/20/13 0709  WBC 3.5*  --  3.3*  NEUTROABS 1.4*  --  1.6*  HGB 10.8* 11.2* 10.1*  HCT 31.2* 33.0* 28.8*  MCV 88.9  --  88.9  PLT 204  --  187   Cardiac Enzymes: No results found for this basename: CKTOTAL, CKMB, CKMBINDEX, TROPONINI,  in the last 168 hours BNP: No components found with this basename: POCBNP,  CBG: No results found for this basename: GLUCAP,  in the last 168 hours  Radiological Exams on Admission: Dg Chest 2 View  03/20/2013  *RADIOLOGY REPORT*  Clinical Data: Syncope and dizziness.  CHEST - 2 VIEW  Comparison: None.  Findings: The lungs are clear.  No edema, infiltrate, nodule or pleural fluid is identified.  The heart size is within normal limits.  The bony thorax is unremarkable.  Densely calcified subglandular breast implants are noted bilaterally.  IMPRESSION: No active disease.   Original Report Authenticated By: Irish Lack, M.D.    Ct Head Wo Contrast  03/20/2013  *RADIOLOGY REPORT*  Clinical Data: Syncope.  CT HEAD WITHOUT CONTRAST  Technique:  Contiguous axial images were obtained from the base of the skull through the vertex without contrast.  Comparison: None.  Findings: There is evidence of prior right parietal craniotomy with large  region of the posterior parietal and occipital encephalomalacia present as well as several surgical clips. The brain demonstrates no evidence of hemorrhage, acute infarction, edema, mass effect, extra-axial fluid collection, hydrocephalus or mass lesion.  IMPRESSION: No acute findings.  Prior right parietal craniotomy with a large region of parieto-occipital encephalomalacia related to prior surgery.   Original Report Authenticated By: Irish Lack, M.D.    Ct Abdomen Pelvis W Contrast  03/19/2013  *RADIOLOGY REPORT*  Clinical Data: Diffuse lower  quadrant abdominal pain  CT ABDOMEN AND PELVIS WITH CONTRAST  Technique:  Multidetector CT imaging of the abdomen and pelvis was performed following the standard protocol during bolus administration of intravenous contrast.  Contrast: 100 ml Omnipaque-300 IV  Comparison: None.  Findings: Lung bases are clear.  Bilateral breast augmentation.  Liver, spleen, and pancreas are within normal limits.  Mild thickening of the bilateral adrenal glands, likely reflecting adenomatous hyperplasia.  Status post cholecystectomy. Mild central intrahepatic ductal prominence.  No extrahepatic ductal dilatation.  Kidneys are within normal limits.  No hydronephrosis.  No evidence of bowel obstruction.  Prior appendectomy.  No colonic wall thickening or inflammatory changes.  Atherosclerotic calcifications of the abdominal aorta and branch vessels.  No abdominopelvic ascites.  No suspicious abdominopelvic lymphadenopathy.  Uterus is unremarkable.  No adnexal masses.  Bladder is within normal limits.  Degenerative changes of the lower lumbar spine.  IMPRESSION: No evidence of bowel obstruction.  Prior appendectomy.  No evidence of diverticulitis.  No evidence of abdominal aortic aneurysm.  No CT findings to account for the patient's abdominal pain.   Original Report Authenticated By: Charline Bills, M.D.     EKG: Independently reviewed.--Sinus rhythm, right axis deviation, incomplete  RBBB    Time spent:70 minutes Code Status:   FULL Family Communication:   husband at bedside   Jeri Rawlins, DO  [facility] Pager [phone]  If 7PM-7AM, please contact night-coverage [email or web address] Password [facility] 03/20/2013, 10:06 AM

## 2013-03-20 NOTE — ED Provider Notes (Signed)
History     CSN: [id number]  Arrival date & time 03/20/13  0541   First MD Initiated Contact with Patient 03/20/13 0603      No chief complaint on file.   (Consider location/radiation/quality/duration/timing/severity/associated sxs/prior treatment) HPI  62 year old female with history of hep C, hypertension, seizure and prior surgical history of brain surgery presents for evaluations of syncope. Patient reports she has not been feeling herself for the past several weeks. She's been having nausea worsening affect eating with occasional nonbloody non-bilious vomit. She's been eating and drinking less. She also endorsed some low abdominal discomfort medication burning on urination. She has had 3 separate syncopal episode in the past 2 days. Episodes usually induced when she goes from a sitting addition to a standing position with associated lightheadedness. She also endorsed a mild chest discomfort in the midchest which increases with deep breathing. Pain is nonradiating, lasting only for a few seconds and is not exertional. She denies fever, chills, headache, neck pain, hemoptysis, hematuria, vaginal discharge, or rash. She was seen in the ED yesterday to evaluate for abdominal pain and syncope. Her urine at that time shows no signs of urinary tract infection. She also had an abdominal CT scan to rule out AAA due to significant family history of heart disease. CT scan shows no acute finding. She returned home last night. States she was laying in bed, got to use the bathroom and had another syncopal episode. Her husband states it lasts for less than a minute with no associated confusion afterward. She fell in the bathroom floor, hitting her head and became diaphoretic afterward. Currently denies any significant headache or neck pain. No new numbness or weakness. Patient past surgical history is significant for appendectomy and cholecystectomy.  Past Medical History  Diagnosis Date  . Hepatitis C   .  Hypertension   . Seizures   . Ectopic pregnancy     Past Surgical History  Procedure Laterality Date  . Brain surgery    . Appendectomy    . Cholecystectomy    . Back surgery    . Breast surgery      Family History  Problem Relation Age of Onset  . Heart failure Mother   . Cancer Father     History  Substance Use Topics  . Smoking status: Current Every Day Smoker -- 0.50 packs/day    Types: Cigarettes  . Smokeless tobacco: Not on file  . Alcohol Use: No    OB History   Grav Para Term Preterm Abortions TAB SAB Ect Mult Living   1    1   1         Review of Systems  Constitutional:       10 Systems reviewed and all are negative for acute change except as noted in the HPI.     Allergies  Review of patient's allergies indicates no known allergies.  Home Medications   Current Outpatient Rx  Name  Route  Sig  Dispense  Refill  . acetaminophen-codeine (TYLENOL #3) 300-30 MG per tablet   Oral   Take 1-2 tablets by mouth every 6 (six) hours as needed for pain.   15 tablet   0   . ALPRAZolam (XANAX) 0.25 MG tablet   Oral   Take 1 tablet (0.25 mg total) by mouth at bedtime as needed for sleep.   20 tablet   0   . amLODipine-olmesartan (AZOR) 5-20 MG per tablet   Oral   Take 1 tablet by  mouth daily.         . Boceprevir 200 MG CAPS   Oral   Take 4 capsules by mouth 3 (three) times daily.          . carbamazepine (CARBATROL) 200 MG 12 hr capsule   Oral   Take 1 capsule (200 mg total) by mouth 2 (two) times daily. Takes two tabs BID   120 capsule   0     Patient needs to be seen before any further refill ...   . fluticasone (FLONASE) 50 MCG/ACT nasal spray   Nasal   Place 2 sprays into the nose daily as needed for allergies.         Marland Kitchen levETIRAcetam (KEPPRA) 750 MG tablet   Oral   Take 750 mg by mouth every 12 (twelve) hours.         Marland Kitchen levofloxacin (LEVAQUIN) 500 MG tablet   Oral   Take 1 tablet (500 mg total) by mouth daily.   5 tablet    0   . ondansetron (ZOFRAN ODT) 8 MG disintegrating tablet   Oral   Take 1 tablet (8 mg total) by mouth every 8 (eight) hours as needed for nausea.   20 tablet   0   . peginterferon alfa-2a (PEGASYS) 180 MCG/ML injection   Subcutaneous   Inject 180 mcg into the skin every 7 (seven) days.         . ribavirin (RIBATAB) 400 MG tablet   Oral   Take 400 mg by mouth 2 (two) times daily.            BP 145/86  Pulse 83  Temp(Src) 98 F (36.7 C) (Oral)  Resp 19  SpO2 96%  Physical Exam  Nursing note and vitals reviewed. Constitutional: She is oriented to person, place, and time. She appears well-developed and well-nourished. No distress.  Awake, alert, nontoxic appearance  HENT:  Head: Normocephalic and atraumatic.  Right Ear: External ear normal.  Left Ear: External ear normal.  No significant scalp tenderness, or deformity noted.    Oral mucosa dry.  Eyes: Conjunctivae and EOM are normal. Pupils are equal, round, and reactive to light. Right eye exhibits no discharge. Left eye exhibits no discharge.  Neck: Normal range of motion. Neck supple. No JVD present.  Cardiovascular: Normal rate and regular rhythm.  Exam reveals no gallop and no friction rub.   No murmur heard. Pulmonary/Chest: Effort normal. No respiratory distress. She exhibits no tenderness.  Abdominal: Soft. There is no tenderness (Left lower quadrant tenderness without guarding or rebound tenderness. No hernia noted.). There is no rebound.  Musculoskeletal: Normal range of motion. She exhibits no edema and no tenderness.       Cervical back: Normal.       Thoracic back: Normal.       Lumbar back: Normal.  ROM appears intact, no obvious focal weakness  Neurological: She is alert and oriented to person, place, and time. She has normal strength. No cranial nerve deficit or sensory deficit. She displays a negative Romberg sign. Coordination and gait normal. GCS eye subscore is 4. GCS verbal subscore is 5. GCS motor  subscore is 6.  Mental status and motor strength appears intact  Skin: No rash noted.  Psychiatric: She has a normal mood and affect.    ED Course  Procedures (including critical care time)   Date: 03/20/2013  Rate: 73  Rhythm: normal sinus rhythm  QRS Axis: normal  Intervals: normal  ST/T  Wave abnormalities: normal  Conduction Disutrbances:right bundle branch block and left posterior fascicular block  Narrative Interpretation:   Old EKG Reviewed: unchanged    8:13 AM Patient presents with recurrent syncopal episode, most recent was last night. Head CT unremarkable, normal orthostatic vital sign, no focal neuro deficit.  Her sodium level is dropping to 125, was 129 yesterday. a mild drop in her hemoglobin to 10.1, it was 11.2 yesterday. Hemoccult negative. Due to the recurrent of symptoms, will request for admission for further management. We'll continue with IV fluid hydration. Patient agrees with plan.  8:43 AM I have consult with [facility] who agrees to see patient in the ER and we'll plan on dispo.  . Patient has received 2 L of normal saline, appears to be comfortable and resting in bed  Labs Reviewed  CBC WITH DIFFERENTIAL - Abnormal; Notable for the following:    WBC 3.3 (*)    RBC 3.24 (*)    Hemoglobin 10.1 (*)    HCT 28.8 (*)    Neutro Abs 1.6 (*)    Monocytes Relative 16 (*)    All other components within normal limits  BASIC METABOLIC PANEL - Abnormal; Notable for the following:    Sodium 125 (*)    Potassium 3.4 (*)    Chloride 92 (*)    BUN 5 (*)    All other components within normal limits  URINALYSIS, ROUTINE W REFLEX MICROSCOPIC - Abnormal; Notable for the following:    APPearance CLOUDY (*)    Leukocytes, UA MODERATE (*)    All other components within normal limits  HEPATIC FUNCTION PANEL - Abnormal; Notable for the following:    AST 43 (*)    All other components within normal limits  URINE MICROSCOPIC-ADD ON - Abnormal; Notable for the  following:    Squamous Epithelial / LPF MANY (*)    Bacteria, UA FEW (*)    All other components within normal limits  URINE CULTURE  LIPASE, BLOOD  OCCULT BLOOD X 1 CARD TO LAB, STOOL  POCT I-STAT TROPONIN I  OCCULT BLOOD, POC DEVICE   Ct Abdomen Pelvis W Contrast  03/19/2013  *RADIOLOGY REPORT*  Clinical Data: Diffuse lower quadrant abdominal pain  CT ABDOMEN AND PELVIS WITH CONTRAST  Technique:  Multidetector CT imaging of the abdomen and pelvis was performed following the standard protocol during bolus administration of intravenous contrast.  Contrast: 100 ml Omnipaque-300 IV  Comparison: None.  Findings: Lung bases are clear.  Bilateral breast augmentation.  Liver, spleen, and pancreas are within normal limits.  Mild thickening of the bilateral adrenal glands, likely reflecting adenomatous hyperplasia.  Status post cholecystectomy. Mild central intrahepatic ductal prominence.  No extrahepatic ductal dilatation.  Kidneys are within normal limits.  No hydronephrosis.  No evidence of bowel obstruction.  Prior appendectomy.  No colonic wall thickening or inflammatory changes.  Atherosclerotic calcifications of the abdominal aorta and branch vessels.  No abdominopelvic ascites.  No suspicious abdominopelvic lymphadenopathy.  Uterus is unremarkable.  No adnexal masses.  Bladder is within normal limits.  Degenerative changes of the lower lumbar spine.  IMPRESSION: No evidence of bowel obstruction.  Prior appendectomy.  No evidence of diverticulitis.  No evidence of abdominal aortic aneurysm.  No CT findings to account for the patient's abdominal pain.   Original Report Authenticated By: Charline Bills, M.D.      1. Syncope   2. Hyponatremia   3. Hep C w/o coma, chronic   4. Hypokalemia  MDM  BP 129/73  Pulse 66  Temp(Src) 98 F (36.7 C) (Oral)  Resp 16  SpO2 98%  I have reviewed nursing notes and vital signs. I personally reviewed the imaging tests through PACS system  I  reviewed available ER/hospitalization records thought the EMR    Fayrene Helper, [place] 03/20/13 1623  Shared service with midlevel provider. I have personally seen and examined the patient, providing direct face to face care, presenting with the chief complaint of recurrent syncope. Physical exam findings include stable vitals, and non focal neuro exam. Plan will be admission. I have reviewed the nursing documentation on past medical history, family history, and social history.  Derwood Kaplan, MD 04/04/13 0800

## 2013-03-20 NOTE — ED Notes (Signed)
PA at bedside.

## 2013-03-20 NOTE — ED Notes (Signed)
Called report on pt, pt cannot go to bed assigned, charge nurse will call back with new bed assignment

## 2013-03-21 DIAGNOSIS — I10 Essential (primary) hypertension: Secondary | ICD-10-CM

## 2013-03-21 LAB — BASIC METABOLIC PANEL
BUN: 4 mg/dL — ABNORMAL LOW (ref 6–23)
CO2: 28 mEq/L (ref 19–32)
Calcium: 8.5 mg/dL (ref 8.4–10.5)
Chloride: 94 mEq/L — ABNORMAL LOW (ref 96–112)
Creatinine, Ser: 0.6 mg/dL (ref 0.50–1.10)
GFR calc Af Amer: >90 mL/min (ref >90)
GFR calc non Af Amer: >90 mL/min (ref >90)
Glucose, Bld: 81 mg/dL (ref 70–99)
Potassium: 3.5 mEq/L (ref 3.5–5.1)
Sodium: 129 mEq/L — ABNORMAL LOW (ref 135–145)

## 2013-03-21 LAB — LIPID PANEL
Cholesterol: 286 mg/dL — ABNORMAL HIGH (ref 0–200)
HDL: 89 mg/dL (ref >39)
Triglycerides: 172 mg/dL — ABNORMAL HIGH (ref <150)

## 2013-03-21 LAB — CBC
HCT: 29 % — ABNORMAL LOW (ref 36.0–46.0)
Hemoglobin: 10.1 g/dL — ABNORMAL LOW (ref 12.0–15.0)
MCH: 31 pg (ref 26.0–34.0)
MCHC: 34.8 g/dL (ref 30.0–36.0)
MCV: 89 fL (ref 78.0–100.0)
Platelets: 186 10*3/uL (ref 150–400)
RBC: 3.26 MIL/uL — ABNORMAL LOW (ref 3.87–5.11)
RDW: 13.9 % (ref 11.5–15.5)
WBC: 4.4 10*3/uL (ref 4.0–10.5)

## 2013-03-21 NOTE — Discharge Summary (Signed)
Physician Discharge Summary  Catherine Gonzalez WUJ:[id number] DOB: Feb 24, 1951 DOA: 03/20/2013  PCP: Leo Grosser, MD  Admit date: 03/20/2013 Discharge date: 03/21/2013  Recommendations for Outpatient Follow-up:  1. Pt will need to follow up with PCP in 2-3 weeks post discharge 2. Please obtain BMP to evaluate electrolytes and kidney function, sodium level  3. Please also check CBC to evaluate Hg and Hct levels 4. Please consider changing carbamazepine due to hyponatremia, even though it was not considered to be primary contributor to hyponatremia, please see details below   Discharge Diagnoses: syncope, likely vasovagal  Active Problems:   Syncope and collapse   Hyponatremia   Hypokalemia  Discharge Condition: Stable  Diet recommendation: Heart healthy diet discussed in details   History of present illness:  62 year old female with a history of hypertension, chronic hepatitis C, seizure disorder, benign brain tumor status post resection 20 years ago, and anxiety presents with a syncopal episode last night as the patient was walking from her bedroom to the bathroom. The patient has been complaining of dizziness for the past 3 weeks primarily with changing of positions from a seated to standing position. The patient stated that she felt dizzy upon standing and walking to the bathroom after which the patient had a witnessed syncopal episode by her husband. There was no tonic-clonic activity, and the patient woke up without any lethargy. The episode lasted less than 1 minute. There was no bowel or bladder incontinence. The patient did not bite her tongue. The patient had a similar episode Friday evening with similar symptoms. The patient has been complaining of intermittent chest discomfort with shortness of breath. The patient was diagnosed with "bronchitis" by her primary care physician approximately 2 weeks ago. The patient was given an initial course of 7 days of antibiotics followed by 5  days of Levaquin of which the patient has her final dose today. She states that her breathing and coughing have significantly improved with antibiotics. However she continues to have some shortness of breath at rest which she attributes to some panic attacks. She denies any worsening abdominal or lower extremity edema, orthopnea. The patient denies any fevers, chills, vomiting, diarrhea, headache, hematochezia, melena. The patient came to the emergency department 03/19/2013 with abdominal pain and sent home with Tylenol #3. CT of the abdomen and pelvis on 03/19/2013 was negative for any acute findings. Regarding the patient's chronic hepatitis C, the patient has been taking pegylated interferon for approximately 3 months in addition to ribavirin and boceprevir. She sees GI physician, Dr. Teena Dunk, in [place]. However, the patient has not been taking her oral hepatitis C medications for the past 2-3 days because of nausea. She did receive her interferon dose last Friday which was 3 days ago.  In the emergency department, the patient's orthostatics were negative. The patient was found to have hyponatremia with a sodium of 125, nonacute   Assessment/Plan:  Syncope  -Appears to be vagally mediated superimposed upon the patient's hypovolemia--clinically, patient appeared dry  -Patient notes decreased oral intake for the past 3 weeks  -Clinical history and not consistent with seizure  -No events on telemetry over 24 hours -Orthostatics negative in the emergency department  Hyponatremia  -Likely due to the patient's hypovolemia imposed on chronic effect of tegretol -This medication can be discontinued if PCP agrees as it can certainly lower the sodium level even more -Continue Keppra  - sodium level improving after IVF  Abdominal pain  -CT abdomen was nonacute  -Suspect a degree of  constipation  -No bowel movement in 4 days  Chronic hepatitis C  -Patient received interferon last Friday, 3 days prior to  this admission -Continue ribavirin and boceprevir--patient states that she has not taken in 2-3 days  Hypertension  -reasonable inpatient control  Hypokalemia  -supplemented and within normal limits prior to discharge   Procedures/Studies: Dg Chest 2 View No active disease. Ct Head Wo Contrast 03/20/2013  No acute findings.  Prior right parietal craniotomy with a large region of parieto-occipital encephalomalacia related to prior surgery.    Ct Abdomen Pelvis W Contrast  03/19/2013  No evidence of bowel obstruction.  Prior appendectomy.  No evidence of diverticulitis.  No evidence of abdominal aortic aneurysm.    Consultations:  Neurology   Antibiotics:  Continue Levaquin as prescribed by PCP, please note this was not started while in the hospital   Discharge Exam: Filed Vitals:   03/21/13 0548  BP: 136/92  Pulse: 70  Temp: 98.2 F (36.8 C)  Resp: 18   Filed Vitals:   03/20/13 0814 03/20/13 1044 03/20/13 2054 03/21/13 0548  BP: 129/73 157/76 147/74 136/92  Pulse: 66 71 77 70  Temp:  98.2 F (36.8 C) 97.9 F (36.6 C) 98.2 F (36.8 C)  TempSrc:  Oral Oral Oral  Resp: 16  18 18   Height:  5\' 6"  (1.676 m)    Weight:  58.65 kg (129 lb 4.8 oz)    SpO2: 98% 98% 98% 96%    General: Pt is alert, follows commands appropriately, not in acute distress Cardiovascular: Regular rate and rhythm, S1/S2 +, no murmurs, no rubs, no gallops Respiratory: Clear to auscultation bilaterally, no wheezing, no crackles, no rhonchi Abdominal: Soft, non tender, non distended, bowel sounds +, no guarding Extremities: no edema, no cyanosis, pulses palpable bilaterally DP and PT Neuro: Grossly nonfocal  Discharge Instructions  Discharge Orders   Future Orders Complete By Expires     Diet - low sodium heart healthy  As directed     Increase activity slowly  As directed         Medication List    TAKE these medications       acetaminophen-codeine 300-30 MG per tablet  Commonly known as:   TYLENOL #3  Take 1-2 tablets by mouth every 6 (six) hours as needed for pain.     ALPRAZolam 0.25 MG tablet  Commonly known as:  XANAX  Take 1 tablet (0.25 mg total) by mouth at bedtime as needed for sleep.     AZOR 5-20 MG per tablet  Generic drug:  amLODipine-olmesartan  Take 1 tablet by mouth daily.     Boceprevir 200 MG Caps  Take 4 capsules by mouth 3 (three) times daily.     carbamazepine 200 MG 12 hr capsule  Commonly known as:  CARBATROL  Take 1 capsule (200 mg total) by mouth 2 (two) times daily. Takes two tabs BID     fluticasone 50 MCG/ACT nasal spray  Commonly known as:  FLONASE  Place 2 sprays into the nose daily as needed for allergies.     levETIRAcetam 750 MG tablet  Commonly known as:  KEPPRA  Take 750 mg by mouth every 12 (twelve) hours.     levofloxacin 500 MG tablet  Commonly known as:  LEVAQUIN  Take 1 tablet (500 mg total) by mouth daily.     ondansetron 8 MG disintegrating tablet  Commonly known as:  ZOFRAN ODT  Take 1 tablet (8 mg total) by mouth  every 8 (eight) hours as needed for nausea.     peginterferon alfa-2a 180 MCG/ML injection  Commonly known as:  PEGASYS  Inject 180 mcg into the skin every 7 (seven) days.     ribavirin 400 MG tablet  Commonly known as:  RIBATAB  Take 400 mg by mouth 2 (two) times daily.           Follow-up Information   Follow up with Leo Grosser, MD.   Contact information:   [address] [place] [address] [phone]        The results of significant diagnostics from this hospitalization (including imaging, microbiology, ancillary and laboratory) are listed below for reference.     Microbiology: No results found for this or any previous visit (from the past 240 hour(s)).   Labs: Basic Metabolic Panel:  Recent Labs Lab 03/19/13 1211 03/19/13 1223 03/20/13 0709 03/20/13 1121 03/21/13 0420  NA  --  129* 125* 129* 129*  K  --  3.9 3.4* 3.6 3.5  CL  --  93* 92* 97 94*  CO2   --   --  28 27 28   GLUCOSE  --  108* 99 97 81  BUN  --  5* 5* 4* 4*  CREATININE  --  0.80 0.59 0.58 0.60  CALCIUM  --   --  8.7 8.3* 8.5  MG 1.8  --   --  1.9  --    Liver Function Tests:  Recent Labs Lab 03/20/13 0709  AST 43*  ALT 28  ALKPHOS 87  BILITOT 0.3  PROT 6.9  ALBUMIN 3.7    Recent Labs Lab 03/20/13 0709  LIPASE 23   CBC:  Recent Labs Lab 03/19/13 1211 03/19/13 1223 03/20/13 0709 03/21/13 0420  WBC 3.5*  --  3.3* 4.4  NEUTROABS 1.4*  --  1.6*  --   HGB 10.8* 11.2* 10.1* 10.1*  HCT 31.2* 33.0* 28.8* 29.0*  MCV 88.9  --  88.9 89.0  PLT 204  --  187 186   Cardiac Enzymes:  Recent Labs Lab 03/20/13 1122 03/20/13 1636  TROPONINI <0.30 <0.30   SIGNED: Time coordinating discharge: Over 30 minutes  Debbora Presto, MD  [facility] 03/21/2013, 9:34 AM Pager [phone]  If 7PM-7AM, please contact night-coverage [email or web address] Password [email or web address]

## 2013-03-24 LAB — URINE CULTURE: Colony Count: 60000

## 2013-03-27 ENCOUNTER — Ambulatory Visit (INDEPENDENT_AMBULATORY_CARE_PROVIDER_SITE_OTHER): Payer: Medicare Other | Admitting: Family Medicine

## 2013-03-27 ENCOUNTER — Encounter: Payer: Self-pay | Admitting: Family Medicine

## 2013-03-27 ENCOUNTER — Telehealth: Payer: Self-pay | Admitting: Family Medicine

## 2013-03-27 VITALS — BP 140/76 | HR 76 | Temp 98.3°F | Resp 16 | Wt 124.0 lb

## 2013-03-27 DIAGNOSIS — K5900 Constipation, unspecified: Secondary | ICD-10-CM

## 2013-03-27 DIAGNOSIS — G40909 Epilepsy, unspecified, not intractable, without status epilepticus: Secondary | ICD-10-CM

## 2013-03-27 DIAGNOSIS — F411 Generalized anxiety disorder: Secondary | ICD-10-CM

## 2013-03-27 DIAGNOSIS — E871 Hypo-osmolality and hyponatremia: Secondary | ICD-10-CM

## 2013-03-27 DIAGNOSIS — F329 Major depressive disorder, single episode, unspecified: Secondary | ICD-10-CM

## 2013-03-27 NOTE — Progress Notes (Signed)
Subjective:    Patient ID: Catherine Gonzalez, female    DOB: 06/18/1951, 62 y.o.   MRN: [id number]  HPI  Patient is here for hospital followup. She was recently admitted after a syncopal episode. She was found to be hypovolemic and also to have hyponatremia. Hyponatremia was felt to be due to her Tegretol. This was discontinued in the hospital without problem. The patient independently resume the Tegretol at home. She continues to report dizziness, postprandial nausea, and disequilibrium. She has a history of seizure disorder due to the resection of brain tumor over 20 years ago. She is currently taking Tegretol along with. She's had no seizure activity in the last year.  She is also having abdominal pain. She called me last night stating that she had not had a bowel movement in over for 5 days. I recommended magnesium citrate over-the-counter. She was able to have a bowel movement yesterday afternoon. But she continues to have crampy abdominal pain. She is tender to palpation in the right lower quadrant and left lower quadrant. CT scan of the abdomen reviewed from hospitalization on April the 20th revealed no acute intra-abdominal process.  The patient also reports nightly anxiety. She is unable to sleep. She is also having panic attacks. She reports some depression symptomatology. Again her Xanax 0.25 mg tablets. She states that these do not work within a strong enough. She is currently on treatment for hepatitis C under the care of Dr. Teena Dunk.  Past Medical History  Diagnosis Date  . Hepatitis C   . Hypertension   . Seizures   . Ectopic pregnancy    Current Outpatient Prescriptions on File Prior to Visit  Medication Sig Dispense Refill  . acetaminophen-codeine (TYLENOL #3) 300-30 MG per tablet Take 1-2 tablets by mouth every 6 (six) hours as needed for pain.  15 tablet  0  . ALPRAZolam (XANAX) 0.25 MG tablet Take 1 tablet (0.25 mg total) by mouth at bedtime as needed for sleep.  20 tablet  0   . amLODipine-olmesartan (AZOR) 5-20 MG per tablet Take 1 tablet by mouth daily.      . Boceprevir 200 MG CAPS Take 4 capsules by mouth 3 (three) times daily.       . carbamazepine (CARBATROL) 200 MG 12 hr capsule Take 1 capsule (200 mg total) by mouth 2 (two) times daily. Takes two tabs BID  120 capsule  0  . fluticasone (FLONASE) 50 MCG/ACT nasal spray Place 2 sprays into the nose daily as needed for allergies.      Marland Kitchen levETIRAcetam (KEPPRA) 750 MG tablet Take 750 mg by mouth every 12 (twelve) hours.      . ondansetron (ZOFRAN ODT) 8 MG disintegrating tablet Take 1 tablet (8 mg total) by mouth every 8 (eight) hours as needed for nausea.  20 tablet  0  . peginterferon alfa-2a (PEGASYS) 180 MCG/ML injection Inject 180 mcg into the skin every 7 (seven) days.      . ribavirin (RIBATAB) 400 MG tablet Take 400 mg by mouth 2 (two) times daily.        No current facility-administered medications on file prior to visit.   No Known Allergies History   Social History  . Marital Status: Married    Spouse Name: N/A    Number of Children: N/A  . Years of Education: N/A   Occupational History  . Not on file.   Social History Main Topics  . Smoking status: Current Every Day Smoker -- 0.50 packs/day  Types: Cigarettes  . Smokeless tobacco: Never Used  . Alcohol Use: No  . Drug Use: No  . Sexually Active: No   Other Topics Concern  . Not on file   Social History Narrative  . No narrative on file    Family History  Problem Relation Age of Onset  . Heart failure Mother   . Cancer Father      Review of Systems  Constitutional: Positive for activity change, appetite change and fatigue.  Respiratory: Negative.   Cardiovascular: Negative.   Gastrointestinal: Positive for nausea, constipation and abdominal distention. Negative for vomiting and diarrhea.  Skin: Negative.   Neurological: Positive for dizziness, weakness, light-headedness and headaches. Negative for tremors, seizures,  syncope, speech difficulty and numbness.       Objective:   Physical Exam  Constitutional: She is oriented to person, place, and time. She appears well-developed and well-nourished.  HENT:  Head: Normocephalic.  Nose: Nose normal.  Mouth/Throat: Oropharynx is clear and moist. No oropharyngeal exudate.  Neck: Neck supple. No JVD present. No thyromegaly present.  Cardiovascular: Normal rate, regular rhythm and normal heart sounds.  Exam reveals no gallop and no friction rub.   No murmur heard. Pulmonary/Chest: Effort normal and breath sounds normal. No respiratory distress. She has no wheezes. She has no rales. She exhibits no tenderness.  Abdominal: Soft. Bowel sounds are normal. She exhibits no distension and no mass. There is tenderness (rlq/llq). There is no rebound and no guarding.  Lymphadenopathy:    She has no cervical adenopathy.  Neurological: She is alert and oriented to person, place, and time. She has normal reflexes. She displays normal reflexes. No cranial nerve deficit. She exhibits normal muscle tone. Coordination normal.          Assessment & Plan:  1. Hyponatremia Patient most certainly has hyponatremia likely due to Tegretol and SIADH. We'll repeat a BMP. Her sodium level is low, would hold Tegretol and monitor for return to seizure activity. Consider adding sodium bicarbonate if the patient cannot discontinue Tegretol. - Basic Metabolic Panel - CBC with Differential - Hepatic Function Panel  2. Seizure disorder If her sodium levels low, would recommend discontinuing Tegretol and maintain the patient on Keppra alone. Hesitant to add an antidepressant due to likely contributing effect of SIADH  3. Unspecified constipation Catherine Gonzalez abdominal pain is likely due to the magnesium citrate now the patient has defecated. Recommended tincture of time at 24 hours and see if abdominal pain improves.  4. Depression Patient now take the Xanax as at night as needed for  anxiety. I recommend she contact Dr. Teena Dunk and notify him that the depression is worsening since she began treatment for hepatitis C. She may need to discontinue treatment for hepatitis C if her mood disorder worsens.  5. Generalized anxiety disorder Xanax 0.25 mg tablets one to 2 by mouth each bedtime when necessary anxiety or insomnia.

## 2013-03-28 ENCOUNTER — Encounter: Payer: Self-pay | Admitting: Family Medicine

## 2013-03-28 ENCOUNTER — Other Ambulatory Visit: Payer: Self-pay | Admitting: Family Medicine

## 2013-03-28 LAB — BASIC METABOLIC PANEL
BUN: 6 mg/dL (ref 6–23)
CO2: 27 mEq/L (ref 19–32)
Chloride: 89 mEq/L — ABNORMAL LOW (ref 96–112)
Creat: 0.7 mg/dL (ref 0.50–1.10)
Glucose, Bld: 101 mg/dL — ABNORMAL HIGH (ref 70–99)
Potassium: 4.4 mEq/L (ref 3.5–5.3)

## 2013-03-28 LAB — CBC WITH DIFFERENTIAL/PLATELET
Basophils Relative: 0 % (ref 0–1)
Eosinophils Absolute: 0 10*3/uL (ref 0.0–0.7)
Eosinophils Relative: 0 % (ref 0–5)
Lymphs Abs: 1.4 10*3/uL (ref 0.7–4.0)
MCH: 31.1 pg (ref 26.0–34.0)
MCHC: 34.3 g/dL (ref 30.0–36.0)
MCV: 90.5 fL (ref 78.0–100.0)
Neutrophils Relative %: 49 % (ref 43–77)
Platelets: 211 10*3/uL (ref 150–400)
RBC: 3.38 MIL/uL — ABNORMAL LOW (ref 3.87–5.11)
RDW: 15.2 % (ref 11.5–15.5)

## 2013-03-28 LAB — HEPATIC FUNCTION PANEL
AST: 50 U/L — ABNORMAL HIGH (ref 0–37)
Albumin: 4.5 g/dL (ref 3.5–5.2)
Alkaline Phosphatase: 84 U/L (ref 39–117)
Total Protein: 6.9 g/dL (ref 6.0–8.3)

## 2013-03-29 NOTE — Telephone Encounter (Signed)
Done

## 2013-03-31 ENCOUNTER — Other Ambulatory Visit (INDEPENDENT_AMBULATORY_CARE_PROVIDER_SITE_OTHER): Payer: Medicare Other

## 2013-03-31 DIAGNOSIS — R110 Nausea: Secondary | ICD-10-CM

## 2013-03-31 DIAGNOSIS — E871 Hypo-osmolality and hyponatremia: Secondary | ICD-10-CM

## 2013-03-31 DIAGNOSIS — Z79899 Other long term (current) drug therapy: Secondary | ICD-10-CM

## 2013-03-31 DIAGNOSIS — Z0000 Encounter for general adult medical examination without abnormal findings: Secondary | ICD-10-CM

## 2013-04-01 LAB — HEPATIC FUNCTION PANEL
ALT: 34 U/L (ref 0–35)
AST: 44 U/L — ABNORMAL HIGH (ref 0–37)
Albumin: 4.4 g/dL (ref 3.5–5.2)
Alkaline Phosphatase: 81 U/L (ref 39–117)
Indirect Bilirubin: 0.3 mg/dL (ref 0.0–0.9)
Total Protein: 7 g/dL (ref 6.0–8.3)

## 2013-04-01 LAB — BASIC METABOLIC PANEL
BUN: 16 mg/dL (ref 6–23)
Chloride: 94 mEq/L — ABNORMAL LOW (ref 96–112)
Glucose, Bld: 94 mg/dL (ref 70–99)
Potassium: 5.6 mEq/L — ABNORMAL HIGH (ref 3.5–5.3)
Sodium: 128 mEq/L — ABNORMAL LOW (ref 135–145)

## 2013-04-01 LAB — CBC WITH DIFFERENTIAL/PLATELET
Eosinophils Absolute: 0 10*3/uL (ref 0.0–0.7)
Eosinophils Relative: 1 % (ref 0–5)
HCT: 28.6 % — ABNORMAL LOW (ref 36.0–46.0)
Lymphocytes Relative: 36 % (ref 12–46)
Lymphs Abs: 2.1 10*3/uL (ref 0.7–4.0)
MCH: 30.8 pg (ref 26.0–34.0)
MCV: 91.7 fL (ref 78.0–100.0)
Monocytes Absolute: 0.9 10*3/uL (ref 0.1–1.0)
Platelets: 227 10*3/uL (ref 150–400)
RBC: 3.12 MIL/uL — ABNORMAL LOW (ref 3.87–5.11)

## 2013-04-07 ENCOUNTER — Other Ambulatory Visit: Payer: Self-pay | Admitting: Family Medicine

## 2013-04-07 NOTE — Telephone Encounter (Signed)
Ok to refill 

## 2013-04-07 NOTE — Telephone Encounter (Signed)
-  ok to rf

## 2013-04-10 ENCOUNTER — Other Ambulatory Visit: Payer: Self-pay | Admitting: Family Medicine

## 2013-04-21 ENCOUNTER — Other Ambulatory Visit (INDEPENDENT_AMBULATORY_CARE_PROVIDER_SITE_OTHER): Payer: Medicare Other

## 2013-04-21 DIAGNOSIS — Z0000 Encounter for general adult medical examination without abnormal findings: Secondary | ICD-10-CM

## 2013-04-21 DIAGNOSIS — R7989 Other specified abnormal findings of blood chemistry: Secondary | ICD-10-CM

## 2013-04-22 LAB — COMPREHENSIVE METABOLIC PANEL
ALT: 33 U/L (ref 0–35)
AST: 60 U/L — ABNORMAL HIGH (ref 0–37)
Alkaline Phosphatase: 64 U/L (ref 39–117)
BUN: 12 mg/dL (ref 6–23)
Calcium: 8.9 mg/dL (ref 8.4–10.5)
Chloride: 89 mEq/L — ABNORMAL LOW (ref 96–112)
Creat: 1.08 mg/dL (ref 0.50–1.10)
Total Bilirubin: 0.3 mg/dL (ref 0.3–1.2)

## 2013-04-27 ENCOUNTER — Ambulatory Visit (INDEPENDENT_AMBULATORY_CARE_PROVIDER_SITE_OTHER): Payer: Medicare Other | Admitting: Family Medicine

## 2013-04-27 ENCOUNTER — Encounter: Payer: Self-pay | Admitting: Family Medicine

## 2013-04-27 VITALS — BP 136/74 | HR 78 | Temp 98.3°F | Resp 18 | Wt 126.0 lb

## 2013-04-27 DIAGNOSIS — E871 Hypo-osmolality and hyponatremia: Secondary | ICD-10-CM

## 2013-04-27 NOTE — Progress Notes (Signed)
Subjective:    Patient ID: Catherine Gonzalez, female    DOB: 11-08-1951, 62 y.o.   MRN: [id number]  HPI Patient is here for followup of her hyponatremia. She had her blood drawn late last week and was found to have a sodium level of 121. She is unable to reach our nurse to receive the information. Therefore she has not been restricting her fluids to 1000 mL a day. She has also not increase her sodium bicarbonate tablets. She is currently taking 350 mg by mouth twice a day. She denies any dizziness, headaches, ataxia, nausea, vomiting. She denies any polyuria she denies any oliguria.  She's not drinking alcohol. She has stopped carbamazepine. Past Medical History  Diagnosis Date  . Hepatitis C   . Hypertension   . Seizures   . Ectopic pregnancy    Current Outpatient Prescriptions on File Prior to Visit  Medication Sig Dispense Refill  . acetaminophen-codeine (TYLENOL #3) 300-30 MG per tablet Take 1-2 tablets by mouth every 6 (six) hours as needed for pain.  15 tablet  0  . ALPRAZolam (XANAX) 0.25 MG tablet Take 1 tablet (0.25 mg total) by mouth at bedtime as needed for sleep.  20 tablet  0  . amLODipine-olmesartan (AZOR) 5-20 MG per tablet Take 1 tablet by mouth daily.      . Boceprevir 200 MG CAPS Take 4 capsules by mouth 3 (three) times daily.       . carbamazepine (CARBATROL) 200 MG 12 hr capsule Take 1 capsule (200 mg total) by mouth 2 (two) times daily. Takes two tabs BID  120 capsule  0  . fluticasone (FLONASE) 50 MCG/ACT nasal spray Place 2 sprays into the nose daily as needed for allergies.      Marland Kitchen levETIRAcetam (KEPPRA) 750 MG tablet Take 750 mg by mouth every 12 (twelve) hours.      . ondansetron (ZOFRAN ODT) 8 MG disintegrating tablet Take 1 tablet (8 mg total) by mouth every 8 (eight) hours as needed for nausea.  20 tablet  0  . peginterferon alfa-2a (PEGASYS) 180 MCG/ML injection Inject 180 mcg into the skin every 7 (seven) days.      . ribavirin (RIBATAB) 400 MG tablet Take 400 mg  by mouth 2 (two) times daily.       . carbamazepine (TEGRETOL XR) 200 MG 12 hr tablet TAKE 2 TABLETS TWICE A DAY  120 tablet  5   No current facility-administered medications on file prior to visit.   No Known Allergies History   Social History  . Marital Status: Married    Spouse Name: N/A    Number of Children: N/A  . Years of Education: N/A   Occupational History  . Not on file.   Social History Main Topics  . Smoking status: Current Every Day Smoker -- 0.50 packs/day    Types: Cigarettes  . Smokeless tobacco: Never Used  . Alcohol Use: No  . Drug Use: No  . Sexually Active: No   Other Topics Concern  . Not on file   Social History Narrative  . No narrative on file      Review of Systems  All other systems reviewed and are negative.       Objective:   Physical Exam  Vitals reviewed. Constitutional: She appears well-developed and well-nourished.  Cardiovascular: Normal rate, regular rhythm and normal heart sounds.   No murmur heard. Pulmonary/Chest: Effort normal and breath sounds normal. No respiratory distress. She has no wheezes. She has  no rales.  Abdominal: Soft. Bowel sounds are normal. She exhibits no distension. There is no tenderness. There is no rebound.          Assessment & Plan:  1. Hyponatremia Increase sodium bicarbonate to 650 mg by mouth twice a day. Restrict fluids to 1000 mL via daily.  Recheck sodium today. Also have her come back in one week to recheck it. Check serum osmolality and urine osmolality. If serum osmolality is low and urine osmolality is high, indicates the patient has SIADH. - BASIC METABOLIC PANEL WITH GFR - Osmolality - Osmolality, urine

## 2013-04-28 LAB — BASIC METABOLIC PANEL WITH GFR
CO2: 28 mEq/L (ref 19–32)
Calcium: 9.5 mg/dL (ref 8.4–10.5)
Chloride: 88 mEq/L — ABNORMAL LOW (ref 96–112)
Creat: 1.04 mg/dL (ref 0.50–1.10)
Glucose, Bld: 83 mg/dL (ref 70–99)

## 2013-04-28 LAB — OSMOLALITY: Osmolality: 268 mOsm/kg — ABNORMAL LOW (ref 275–300)

## 2013-05-04 ENCOUNTER — Encounter: Payer: Self-pay | Admitting: Family Medicine

## 2013-05-04 ENCOUNTER — Ambulatory Visit (INDEPENDENT_AMBULATORY_CARE_PROVIDER_SITE_OTHER): Payer: Medicare Other | Admitting: Family Medicine

## 2013-05-04 VITALS — BP 100/60 | HR 64 | Temp 98.2°F | Resp 16 | Wt 128.0 lb

## 2013-05-04 DIAGNOSIS — E871 Hypo-osmolality and hyponatremia: Secondary | ICD-10-CM

## 2013-05-04 DIAGNOSIS — B1920 Unspecified viral hepatitis C without hepatic coma: Secondary | ICD-10-CM

## 2013-05-04 DIAGNOSIS — D692 Other nonthrombocytopenic purpura: Secondary | ICD-10-CM

## 2013-05-04 NOTE — Progress Notes (Signed)
Subjective:    Patient ID: Catherine Gonzalez, female    DOB: 09/07/1951, 62 y.o.   MRN: [id number]  HPI  HPI 04/27/13 Patient is here for followup of her hyponatremia. She had her blood drawn late last week and was found to have a sodium level of 121. She is unable to reach our nurse to receive the information. Therefore she has not been restricting her fluids to 1000 mL a day. She has also not increase her sodium bicarbonate tablets. She is currently taking 350 mg by mouth twice a day. She denies any dizziness, headaches, ataxia, nausea, vomiting. She denies any polyuria she denies any oliguria.  She's not drinking alcohol. She has stopped carbamazepine. Therefore, I diagnosed her with: 1. Hyponatremia Increase sodium bicarbonate to 650 mg by mouth twice a day. Restrict fluids to 1000 mL via daily.  Recheck sodium today. Also have her come back in one week to recheck it. Check serum osmolality and urine osmolality. If serum osmolality is low and urine osmolality is high, indicates the patient has SIADH. - BASIC METABOLIC PANEL WITH GFR - Osmolality - Osmolality, urine  05/04/13 She is here today for followup. She's been limiting her fluid intake to less than thousand milliliters per day. She's been taking sodium bicarbonate 2 tablets by mouth twice a day. She denies any dizziness or nausea. She denies any headaches. She does have widespread purpura on the dorsums of her forearms.  She is here today to recheck her sodium.   Past Medical History  Diagnosis Date  . Hepatitis C   . Hypertension   . Seizures   . Ectopic pregnancy    Current Outpatient Prescriptions on File Prior to Visit  Medication Sig Dispense Refill  . acetaminophen-codeine (TYLENOL #3) 300-30 MG per tablet Take 1-2 tablets by mouth every 6 (six) hours as needed for pain.  15 tablet  0  . ALPRAZolam (XANAX) 0.25 MG tablet Take 1 tablet (0.25 mg total) by mouth at bedtime as needed for sleep.  20 tablet  0  .  amLODipine-olmesartan (AZOR) 5-20 MG per tablet Take 1 tablet by mouth daily.      . Boceprevir 200 MG CAPS Take 4 capsules by mouth 3 (three) times daily.       . carbamazepine (CARBATROL) 200 MG 12 hr capsule Take 1 capsule (200 mg total) by mouth 2 (two) times daily. Takes two tabs BID  120 capsule  0  . fluticasone (FLONASE) 50 MCG/ACT nasal spray Place 2 sprays into the nose daily as needed for allergies.      Marland Kitchen levETIRAcetam (KEPPRA) 750 MG tablet Take 750 mg by mouth every 12 (twelve) hours.      . ondansetron (ZOFRAN ODT) 8 MG disintegrating tablet Take 1 tablet (8 mg total) by mouth every 8 (eight) hours as needed for nausea.  20 tablet  0  . peginterferon alfa-2a (PEGASYS) 180 MCG/ML injection Inject 180 mcg into the skin every 7 (seven) days.      . ribavirin (RIBATAB) 400 MG tablet Take 400 mg by mouth 2 (two) times daily.       . carbamazepine (TEGRETOL XR) 200 MG 12 hr tablet TAKE 2 TABLETS TWICE A DAY  120 tablet  5   No current facility-administered medications on file prior to visit.   No Known Allergies History   Social History  . Marital Status: Married    Spouse Name: N/A    Number of Children: N/A  . Years of Education: N/A  Occupational History  . Not on file.   Social History Main Topics  . Smoking status: Current Every Day Smoker -- 0.50 packs/day    Types: Cigarettes  . Smokeless tobacco: Never Used  . Alcohol Use: No  . Drug Use: No  . Sexually Active: No   Other Topics Concern  . Not on file   Social History Narrative  . No narrative on file      Review of Systems  All other systems reviewed and are negative.       Objective:   Physical Exam   Physical Exam  Vitals reviewed. Constitutional: She appears well-developed and well-nourished.  Cardiovascular: Normal rate, regular rhythm and normal heart sounds.   No murmur heard.      Assessment & Plan:  1. Hyponatremia Urine osmolality and serum osmolality confirmed SIADH.  Recheck  sodium level today. I will check an INR, PTT, PT and CBC due to the patient's purpura. I have asked her to discontinue aspirin. I believe this is likely due to a combination of aspirin therapy and her underlying cirrhosis.

## 2013-05-05 ENCOUNTER — Other Ambulatory Visit: Payer: Self-pay | Admitting: Family Medicine

## 2013-05-05 ENCOUNTER — Telehealth: Payer: Self-pay | Admitting: Family Medicine

## 2013-05-05 DIAGNOSIS — B182 Chronic viral hepatitis C: Secondary | ICD-10-CM

## 2013-05-05 DIAGNOSIS — D649 Anemia, unspecified: Secondary | ICD-10-CM

## 2013-05-05 LAB — CBC WITH DIFFERENTIAL/PLATELET
Basophils Absolute: 0 10*3/uL (ref 0.0–0.1)
Eosinophils Absolute: 0 10*3/uL (ref 0.0–0.7)
Eosinophils Relative: 1 % (ref 0–5)
MCH: 31.7 pg (ref 26.0–34.0)
MCV: 92.1 fL (ref 78.0–100.0)
Neutrophils Relative %: 68 % (ref 43–77)
Platelets: 286 10*3/uL (ref 150–400)
RBC: 2.65 MIL/uL — ABNORMAL LOW (ref 3.87–5.11)
RDW: 15.5 % (ref 11.5–15.5)
WBC: 6.4 10*3/uL (ref 4.0–10.5)

## 2013-05-05 LAB — COMPLETE METABOLIC PANEL WITH GFR
ALT: 28 U/L (ref 0–35)
AST: 45 U/L — ABNORMAL HIGH (ref 0–37)
Albumin: 4.1 g/dL (ref 3.5–5.2)
BUN: 14 mg/dL (ref 6–23)
CO2: 25 mEq/L (ref 19–32)
Calcium: 9.5 mg/dL (ref 8.4–10.5)
Chloride: 94 mEq/L — ABNORMAL LOW (ref 96–112)
Creat: 0.99 mg/dL (ref 0.50–1.10)
GFR, Est African American: 72 mL/min
Potassium: 4.1 mEq/L (ref 3.5–5.3)

## 2013-05-05 LAB — APTT: aPTT: 39 seconds — ABNORMAL HIGH (ref 24–37)

## 2013-05-05 MED ORDER — FERROUS SULFATE 325 (65 FE) MG PO TABS: 325.0000 mg | ORAL_TABLET | Freq: Every day | ORAL | Status: DC

## 2013-05-05 NOTE — Telephone Encounter (Signed)
Pt returned call.  Discussed normal sodium level.  Provider concerned for very low Hgb.  Explain need for urgent GI eval.  Iron called into pharmacy, need start that today.  Pt acknowledge understanding.

## 2013-05-05 NOTE — Telephone Encounter (Signed)
RX sent to pharmacy.  Have left mess for pt to call back TODAY

## 2013-05-05 NOTE — Telephone Encounter (Signed)
Message copied by Donne Anon on Fri May 05, 2013  9:20 AM ------      Message from: Lynnea Ferrier      Created: Fri May 05, 2013  7:32 AM       Sodium is better.  Continue our current plan of care.  However, hgb is dropping suggesting a slow GI bleed.  We need GI consult ASAP for EGD because I suspect she may have esophageal varices.  Meanwhile, start iron sulfate 325 mg poqday immediately. ------

## 2013-05-08 ENCOUNTER — Ambulatory Visit (INDEPENDENT_AMBULATORY_CARE_PROVIDER_SITE_OTHER): Payer: Medicare Other | Admitting: Gastroenterology

## 2013-05-08 ENCOUNTER — Ambulatory Visit (INDEPENDENT_AMBULATORY_CARE_PROVIDER_SITE_OTHER): Payer: Medicare Other

## 2013-05-08 ENCOUNTER — Encounter: Payer: Self-pay | Admitting: Gastroenterology

## 2013-05-08 VITALS — BP 114/80 | HR 88 | Ht 63.25 in | Wt 128.0 lb

## 2013-05-08 DIAGNOSIS — K5900 Constipation, unspecified: Secondary | ICD-10-CM | POA: Insufficient documentation

## 2013-05-08 DIAGNOSIS — K759 Inflammatory liver disease, unspecified: Secondary | ICD-10-CM

## 2013-05-08 DIAGNOSIS — D649 Anemia, unspecified: Secondary | ICD-10-CM

## 2013-05-08 DIAGNOSIS — E222 Syndrome of inappropriate secretion of antidiuretic hormone: Secondary | ICD-10-CM | POA: Insufficient documentation

## 2013-05-08 DIAGNOSIS — B182 Chronic viral hepatitis C: Secondary | ICD-10-CM

## 2013-05-08 DIAGNOSIS — R6889 Other general symptoms and signs: Secondary | ICD-10-CM

## 2013-05-08 LAB — VITAMIN B12: Vitamin B-12: 575 pg/mL (ref 211–911)

## 2013-05-08 LAB — FOLATE: Folate: >24.8 ng/mL (ref >5.9)

## 2013-05-08 LAB — FERRITIN: Ferritin: 201.8 ng/mL (ref 10.0–291.0)

## 2013-05-08 NOTE — Patient Instructions (Addendum)
You have been given a separate informational sheet regarding your tobacco use, the importance of quitting and local resources to help you quit. You have been scheduled for a colonoscopy with propofol. Please follow written instructions given to you at your visit today.  Please pick up your prep kit at the pharmacy within the next 1-3 days. If you use inhalers (even only as needed), please bring them with you on the day of your procedure. Your physician has requested that you go to www.startemmi.com and enter the access code given to you at your visit today. This web site gives a general overview about your procedure. However, you should still follow specific instructions given to you by our office regarding your preparation for the procedure. You will go to the basement for labs today

## 2013-05-08 NOTE — Progress Notes (Signed)
History of Present Illness:  Catherine Gonzalez 62 year old white female with history of hepatitis C, brain tumor and seizures, referred at the request of Dr. Rubin Payor for evaluation of constipation and anemia. She apparently underwent treatment for hepatitis C several months ago and developed constipation. Although she discontinued treatment for at least the last 2 months constipation is an ongoing problem. She may go several days without a bowel movement. She complains of right lower quadrant pain that is relieved with defecation. She was also told to be anemic. She denies melena or hematochezia. She is on no regular gastric irritants including nonsteroidals. She was recently diagnosed with SIADH.    Past Medical History  Diagnosis Date  . Hepatitis C   . Hypertension   . Seizures   . Ectopic pregnancy   . Anemia   . Allergic rhinitis    Past Surgical History  Procedure Laterality Date  . Brain surgery      x 3  . Appendectomy    . Cholecystectomy    . Lumbar spine surgery    . Breast enhancement surgery    . Tonsillectomy and adenoidectomy    . Ectopic pregnancy surgery     family history includes Alcohol abuse in her brother; Heart failure in her mother; Leukemia in her brother; and Lung cancer in her father. Current Outpatient Prescriptions  Medication Sig Dispense Refill  . acetaminophen-codeine (TYLENOL #3) 300-30 MG per tablet Take 1-2 tablets by mouth every 6 (six) hours as needed for pain.  15 tablet  0  . ALPRAZolam (XANAX) 0.25 MG tablet Take 1 tablet (0.25 mg total) by mouth at bedtime as needed for sleep.  20 tablet  0  . amLODipine-olmesartan (AZOR) 5-20 MG per tablet Take 1 tablet by mouth daily.      . carbamazepine (CARBATROL) 200 MG 12 hr capsule Take 1 capsule (200 mg total) by mouth 2 (two) times daily. Takes two tabs BID  120 capsule  0  . ferrous sulfate (FERROUSUL) 325 (65 FE) MG tablet Take 1 tablet (325 mg total) by mouth daily with breakfast.  30 tablet  3  .  fluticasone (FLONASE) 50 MCG/ACT nasal spray Place 2 sprays into the nose daily as needed for allergies.      Marland Kitchen levETIRAcetam (KEPPRA) 750 MG tablet Take 750 mg by mouth every 12 (twelve) hours.      . ondansetron (ZOFRAN ODT) 8 MG disintegrating tablet Take 1 tablet (8 mg total) by mouth every 8 (eight) hours as needed for nausea.  20 tablet  0  . sodium bicarbonate 325 MG tablet Take 325 mg by mouth 2 (two) times daily.       No current facility-administered medications for this visit.   Allergies as of 05/08/2013 - Review Complete 05/08/2013  Allergen Reaction Noted  . Pegasys (peginterferon alfa-2a) Hives 05/08/2013    reports that she has been smoking Cigarettes.  She has a 12 pack-year smoking history. She has never used smokeless tobacco. She reports that she does not drink alcohol or use illicit drugs.     Review of Systems: Pertinent positive and negative review of systems were noted in the above HPI section. All other review of systems were otherwise negative.  Vital signs were reviewed in today's medical record Physical Exam: General: Well developed , well nourished, no acute distress Skin: anicteric; there are ecchymoses over her upper extremities Head: Normocephalic and atraumatic Eyes:  sclerae anicteric, EOMI Ears: Normal auditory acuity Mouth: No deformity or lesions Neck: Supple,  no masses or thyromegaly Lungs: Clear throughout to auscultation Heart: Regular rate and rhythm; no murmurs, rubs or bruits Abdomen: Soft, non tender and non distended. No masses, hepatosplenomegaly or hernias noted. Normal Bowel sounds Rectal:deferred Musculoskeletal: Symmetrical with no gross deformities  Skin: No lesions on visible extremities Pulses:  Normal pulses noted Extremities: No clubbing, cyanosis, edema or deformities noted Neurological: Alert oriented x 4, grossly nonfocal Cervical Nodes:  No significant cervical adenopathy Inguinal Nodes: No significant inguinal  adenopathy Psychological:  Alert and cooperative. Normal mood and affect

## 2013-05-08 NOTE — Assessment & Plan Note (Signed)
Rule out structural abnormality of the colon.  Recommendations #1 colonoscopy

## 2013-05-08 NOTE — Assessment & Plan Note (Addendum)
She apparently did not tolerate treatment including interferon.  New medications will be available by the year's end and I will readdress this problem at that time.  She apparently took one hepatitis B vaccine

## 2013-05-08 NOTE — Assessment & Plan Note (Addendum)
Severe normochromic normocytic anemia of unclear etiology. If this were  bone marrow suppression from interferon or ribavirin I would expect this to have subsided since she's been off therapy for at least 2 months.  Recommendations #1 check stool Hemoccults #2 iron studies, B12 folate levels

## 2013-05-10 ENCOUNTER — Other Ambulatory Visit: Payer: Self-pay | Admitting: Family Medicine

## 2013-05-10 ENCOUNTER — Other Ambulatory Visit (INDEPENDENT_AMBULATORY_CARE_PROVIDER_SITE_OTHER): Payer: Medicare Other

## 2013-05-10 DIAGNOSIS — D649 Anemia, unspecified: Secondary | ICD-10-CM

## 2013-05-10 LAB — HCV RT-PCR, QUANT

## 2013-05-10 LAB — HCV REFLEX TO QUANT RT PCR: HCV Ab: >11 s/co ratio — ABNORMAL HIGH (ref 0.0–0.9)

## 2013-05-10 LAB — FECAL OCCULT BLOOD, IMMUNOCHEMICAL: Fecal Occult Bld: POSITIVE

## 2013-05-10 NOTE — Telephone Encounter (Signed)
?   OK to Refill  

## 2013-05-10 NOTE — Telephone Encounter (Signed)
Ok to refill 

## 2013-05-12 LAB — SPECIMEN STATUS REPORT

## 2013-05-16 ENCOUNTER — Encounter: Payer: Self-pay | Admitting: Gastroenterology

## 2013-05-16 ENCOUNTER — Ambulatory Visit (AMBULATORY_SURGERY_CENTER): Payer: Medicare Other | Admitting: Gastroenterology

## 2013-05-16 VITALS — BP 128/76 | HR 70 | Temp 98.1°F | Resp 11 | Ht 63.25 in | Wt 128.0 lb

## 2013-05-16 DIAGNOSIS — D126 Benign neoplasm of colon, unspecified: Secondary | ICD-10-CM

## 2013-05-16 DIAGNOSIS — K759 Inflammatory liver disease, unspecified: Secondary | ICD-10-CM

## 2013-05-16 DIAGNOSIS — K5730 Diverticulosis of large intestine without perforation or abscess without bleeding: Secondary | ICD-10-CM

## 2013-05-16 DIAGNOSIS — K5900 Constipation, unspecified: Secondary | ICD-10-CM

## 2013-05-16 DIAGNOSIS — D649 Anemia, unspecified: Secondary | ICD-10-CM

## 2013-05-16 MED ORDER — SODIUM CHLORIDE 0.9 % IV SOLN: 500.0000 mL | INTRAVENOUS | Status: DC

## 2013-05-16 NOTE — Progress Notes (Signed)
Patient did not experience any of the following events: a burn prior to discharge; a fall within the facility; wrong site/side/patient/procedure/implant event; or a hospital transfer or hospital admission upon discharge from the facility. (G8907) Patient did not have preoperative order for IV antibiotic SSI prophylaxis. (G8918)  

## 2013-05-16 NOTE — Patient Instructions (Addendum)

## 2013-05-16 NOTE — Op Note (Signed)
[facility] [address] [place], [address]   COLONOSCOPY PROCEDURE REPORT  PATIENT: Catherine Gonzalez, Ator  MR#: [id number] BIRTHDATE: May 26, 1951 , 61  yrs. old GENDER: Female ENDOSCOPIST: Louis Meckel, MD REFERRED XB:JYNWGN Tanya Nones, M.D. PROCEDURE DATE:  05/16/2013 PROCEDURE:   Colonoscopy with snare polypectomy ASA CLASS:   Class II INDICATIONS:Anemia, non-specific and heme-positive stool. MEDICATIONS: MAC sedation, administered by CRNA and Propofol (Diprivan) 230 mg IV  DESCRIPTION OF PROCEDURE:   After the risks benefits and alternatives of the procedure were thoroughly explained, informed consent was obtained.  A digital rectal exam revealed no abnormalities of the rectum.   The LB [id number]  endoscope was introduced through the anus and advanced to the cecum, which was identified by both the appendix and ileocecal valve. No adverse events experienced.   The quality of the prep was excellent using Suprep  The instrument was then slowly withdrawn as the colon was fully examined.      COLON FINDINGS: A flat polyp measuring 3 mm in size was found at the cecum.  A polypectomy was performed with a cold snare.  The resection was complete and the polyp tissue was completely retrieved.   Moderate diverticulosis was noted in the sigmoid colon.   The colon mucosa was otherwise normal.  Retroflexed views revealed no abnormalities. The time to cecum=7 minutes 26 seconds. Withdrawal time=7 minutes 59 seconds.  The scope was withdrawn and the procedure completed. COMPLICATIONS: There were no complications.  ENDOSCOPIC IMPRESSION: 1.   Flat polyp measuring 3 mm in size was found at the cecum; polypectomy was performed with a cold snare 2.   Moderate diverticulosis was noted in the sigmoid colon 3.   The colon mucosa was otherwise normal  Findings do not explain etiology for Hemoccult-positive stool  RECOMMENDATIONS: 1.  If the polyp(s) removed today  are proven to be adenomatous (pre-cancerous) polyps, you will need a repeat colonoscopy in 5 years.  Otherwise you should continue to follow colorectal cancer screening guidelines for "routine risk" patients with colonoscopy in 10 years.  You will receive a letter within 1-2 weeks with the results of your biopsy as well as final recommendations.  Please call my office if you have not received a letter after 3 weeks. 2.   EGD for w/u of heme positive stool/anemia   eSigned:  Louis Meckel, MD 05/16/2013 11:59 AM   cc:

## 2013-05-16 NOTE — Progress Notes (Signed)
Report to pacu rn, vss, bbs=clear 

## 2013-05-16 NOTE — Progress Notes (Signed)
Darlyn Read, RN checked the pt in for me and went over discharge instructions with the pt and her care partner.  Maw  No complaints noted in the recovery room. Maw

## 2013-05-17 ENCOUNTER — Telehealth: Payer: Self-pay | Admitting: *Deleted

## 2013-05-17 NOTE — Telephone Encounter (Signed)
  Follow up Call-  Call back number 05/16/2013  Post procedure Call Back phone  # [phone]  Permission to leave phone message Yes     Patient questions:  Do you have a fever, pain , or abdominal swelling? no Pain Score  0 *  Have you tolerated food without any problems? yes  Have you been able to return to your normal activities? yes  Do you have any questions about your discharge instructions: Diet   no Medications  no Follow up visit  no  Do you have questions or concerns about your Care? no  Actions: * If pain score is 4 or above: No action needed, pain <4.

## 2013-05-19 ENCOUNTER — Ambulatory Visit (AMBULATORY_SURGERY_CENTER): Payer: Medicare Other | Admitting: *Deleted

## 2013-05-19 VITALS — Ht 63.25 in | Wt 128.0 lb

## 2013-05-19 DIAGNOSIS — D649 Anemia, unspecified: Secondary | ICD-10-CM

## 2013-05-25 ENCOUNTER — Ambulatory Visit (AMBULATORY_SURGERY_CENTER): Payer: Medicare Other | Admitting: Gastroenterology

## 2013-05-25 ENCOUNTER — Encounter: Payer: Self-pay | Admitting: Gastroenterology

## 2013-05-25 VITALS — BP 116/74 | HR 66 | Temp 97.2°F | Resp 17 | Ht 63.0 in | Wt 128.0 lb

## 2013-05-25 DIAGNOSIS — D649 Anemia, unspecified: Secondary | ICD-10-CM

## 2013-05-25 MED ORDER — SODIUM CHLORIDE 0.9 % IV SOLN: 500.0000 mL | INTRAVENOUS | Status: DC

## 2013-05-25 NOTE — Progress Notes (Signed)
Hemoccult stool cards given to pt and instructions explained.  Understanding voiced.    Patient did not experience any of the following events: a burn prior to discharge; a fall within the facility; wrong site/side/patient/procedure/implant event; or a hospital transfer or hospital admission upon discharge from the facility. (G8907)Patient did not have preoperative order for IV antibiotic SSI prophylaxis. [phone])

## 2013-05-25 NOTE — Op Note (Signed)
 [facility] [address] [place], [address]   ENDOSCOPY PROCEDURE REPORT  PATIENT: Catherine, Gonzalez  MR#: [id number] BIRTHDATE: 02/08/1951 , 61  yrs. old GENDER: Female ENDOSCOPIST: Louis Meckel, MD REFERRED BY:  Lynnea Ferrier, M.D. PROCEDURE DATE:  05/25/2013 PROCEDURE:  EGD, diagnostic ASA CLASS:     Class II INDICATIONS:  Anemia. MEDICATIONS: MAC sedation, administered by CRNA, propofol (Diprivan) 150mg  IV, and Simethicone 0.6cc PO TOPICAL ANESTHETIC: Cetacaine Spray  DESCRIPTION OF PROCEDURE: After the risks benefits and alternatives of the procedure were thoroughly explained, informed consent was obtained.  The LB [id number] endoscope was introduced through the mouth and advanced to the third portion of the duodenum. Without limitations.  The instrument was slowly withdrawn as the mucosa was fully examined.      The upper, middle and distal third of the esophagus were carefully inspected and no abnormalities were noted.  The z-line was well seen at the GEJ.  The endoscope was pushed into the fundus which was normal including a retroflexed view.  The antrum, gastric body, first and second part of the duodenum were unremarkable. Retroflexed views revealed no abnormalities.     The scope was then withdrawn from the patient and the procedure completed.  COMPLICATIONS: There were no complications. ENDOSCOPIC IMPRESSION: Normal EGD  RECOMMENDATIONS: Hemmoccult stools  REPEAT EXAM:  eSigned:  Louis Meckel, MD 05/25/2013 9:29 AM   CC:

## 2013-05-25 NOTE — Patient Instructions (Addendum)
YOU HAD AN ENDOSCOPIC PROCEDURE TODAY AT THE [facility]: Refer to the procedure report that was given to you for any specific questions about what was found during the examination.  If the procedure report does not answer your questions, please call your gastroenterologist to clarify.  If you requested that your care partner not be given the details of your procedure findings, then the procedure report has been included in a sealed envelope for you to review at your convenience later.  YOU SHOULD EXPECT: Some feelings of bloating in the abdomen. Passage of more gas than usual.  Walking can help get rid of the air that was put into your GI tract during the procedure and reduce the bloating  DIET: Your first meal following the procedure should be a light meal and then it is ok to progress to your normal diet.  A half-sandwich or bowl of soup is an example of a good first meal.  Heavy or fried foods are harder to digest and may make you feel nauseous or bloated.  Likewise meals heavy in dairy and vegetables can cause extra gas to form and this can also increase the bloating.  Drink plenty of fluids but you should avoid alcoholic beverages for 24 hours.  ACTIVITY: Your care partner should take you home directly after the procedure.  You should plan to take it easy, moving slowly for the rest of the day.  You can resume normal activity the day after the procedure however you should NOT DRIVE or use heavy machinery for 24 hours (because of the sedation medicines used during the test).    SYMPTOMS TO REPORT IMMEDIATELY: A gastroenterologist can be reached at any hour.  During normal business hours, 8:30 AM to 5:00 PM Monday through Friday, call [phone].  After hours and on weekends, please call the GI answering service at [phone] who will take a message and have the physician on call contact you.   Following upper endoscopy (EGD)  Vomiting of blood or coffee ground material  New  chest pain or pain under the shoulder blades  Painful or persistently difficult swallowing  New shortness of breath  Fever of 100F or higher  Black, tarry-looking stools  FOLLOW UP: Our staff will call the home number listed on your records the next business day following your procedure to check on you and address any questions or concerns that you may have at that time regarding the information given to you following your procedure. This is a courtesy call and so if there is no answer at the home number and we have not heard from you through the emergency physician on call, we will assume that you have returned to your regular daily activities without incident.  SIGNATURES/CONFIDENTIALITY: You and/or your care partner have signed paperwork which will be entered into your electronic medical record.  These signatures attest to the fact that that the information above on your After Visit Summary has been reviewed and is understood.  Full responsibility of the confidentiality of this discharge information lies with you and/or your  care-partner.  Ok to continue your normal medications  Please do hemoccults as directed

## 2013-05-26 ENCOUNTER — Telehealth: Payer: Self-pay | Admitting: *Deleted

## 2013-05-26 NOTE — Telephone Encounter (Signed)
No answer, message advised that voice mail not set up.

## 2013-05-31 ENCOUNTER — Other Ambulatory Visit (INDEPENDENT_AMBULATORY_CARE_PROVIDER_SITE_OTHER): Payer: Medicare Other

## 2013-05-31 DIAGNOSIS — D649 Anemia, unspecified: Secondary | ICD-10-CM

## 2013-06-01 ENCOUNTER — Other Ambulatory Visit: Payer: Self-pay | Admitting: *Deleted

## 2013-06-01 DIAGNOSIS — D649 Anemia, unspecified: Secondary | ICD-10-CM

## 2013-06-01 LAB — HEMOCCULT SLIDES (X 3 CARDS)
Fecal Occult Blood: NEGATIVE
OCCULT 3: NEGATIVE
OCCULT 4: NEGATIVE

## 2013-06-04 ENCOUNTER — Other Ambulatory Visit: Payer: Self-pay | Admitting: Family Medicine

## 2013-06-05 ENCOUNTER — Other Ambulatory Visit: Payer: Self-pay | Admitting: Gastroenterology

## 2013-06-05 DIAGNOSIS — D649 Anemia, unspecified: Secondary | ICD-10-CM

## 2013-06-05 NOTE — Telephone Encounter (Signed)
Ok to refill 

## 2013-06-05 NOTE — Telephone Encounter (Signed)
?  ok to refill °

## 2013-06-05 NOTE — Telephone Encounter (Signed)
?   OK to Refill  

## 2013-06-06 ENCOUNTER — Encounter: Payer: Self-pay | Admitting: Gastroenterology

## 2013-06-06 ENCOUNTER — Other Ambulatory Visit: Payer: Self-pay | Admitting: Gastroenterology

## 2013-06-06 DIAGNOSIS — D649 Anemia, unspecified: Secondary | ICD-10-CM

## 2013-06-20 ENCOUNTER — Telehealth: Payer: Self-pay | Admitting: Oncology

## 2013-06-20 NOTE — Telephone Encounter (Signed)
LVOM FOR PT TO RETURN CALL IN RE NP APPT.  °

## 2013-06-30 ENCOUNTER — Other Ambulatory Visit: Payer: Self-pay | Admitting: Family Medicine

## 2013-06-30 NOTE — Telephone Encounter (Signed)
refilled 06/04/13  #20   Need approval for controlled medication.

## 2013-06-30 NOTE — Telephone Encounter (Signed)
Ok to refill 

## 2013-07-03 ENCOUNTER — Telehealth: Payer: Self-pay | Admitting: Oncology

## 2013-07-03 ENCOUNTER — Other Ambulatory Visit: Payer: Self-pay | Admitting: Family Medicine

## 2013-07-03 NOTE — Telephone Encounter (Signed)
Last OV 05/04/13  Last refill 06/30/13  #20 Need approval for controlled medication.

## 2013-07-03 NOTE — Telephone Encounter (Signed)
S/W PT IN RE NP APPT 09/10 @ 3 W/DR. GRANFORTUNA REFERRING DR. Ronie Spies WELCOME PACKET MAILED.

## 2013-07-03 NOTE — Telephone Encounter (Signed)
RX called in .

## 2013-07-04 ENCOUNTER — Telehealth: Payer: Self-pay | Admitting: Oncology

## 2013-07-04 NOTE — Telephone Encounter (Signed)
C/D 07/04/13 for appt. 08/09/13

## 2013-07-05 ENCOUNTER — Other Ambulatory Visit: Payer: Self-pay | Admitting: Oncology

## 2013-07-05 ENCOUNTER — Other Ambulatory Visit: Payer: Self-pay | Admitting: Family Medicine

## 2013-07-05 DIAGNOSIS — D649 Anemia, unspecified: Secondary | ICD-10-CM

## 2013-07-10 NOTE — Telephone Encounter (Signed)
ok 

## 2013-07-10 NOTE — Telephone Encounter (Signed)
Med refilled.

## 2013-07-10 NOTE — Telephone Encounter (Signed)
This is [facility] Patient. Forward to him.

## 2013-07-17 ENCOUNTER — Other Ambulatory Visit: Payer: Self-pay | Admitting: Family Medicine

## 2013-07-18 ENCOUNTER — Other Ambulatory Visit: Payer: Self-pay | Admitting: Family Medicine

## 2013-08-08 ENCOUNTER — Telehealth: Payer: Self-pay | Admitting: Oncology

## 2013-08-08 NOTE — Telephone Encounter (Signed)
S/W PT AN GVE NEW D/T OF NP APPT 10/01 @ 3 W/DR.GRANFORTUNA NEW CALENDAR MAILED.

## 2013-08-09 ENCOUNTER — Ambulatory Visit: Payer: Medicare Other

## 2013-08-09 ENCOUNTER — Encounter: Payer: Medicare Other | Admitting: Oncology

## 2013-08-18 ENCOUNTER — Other Ambulatory Visit: Payer: Self-pay | Admitting: Family Medicine

## 2013-08-21 ENCOUNTER — Other Ambulatory Visit: Payer: Self-pay | Admitting: Family Medicine

## 2013-08-21 NOTE — Telephone Encounter (Signed)
Med phoned in °

## 2013-08-21 NOTE — Telephone Encounter (Signed)
Ok Refill?? 

## 2013-08-21 NOTE — Telephone Encounter (Signed)
ok 

## 2013-08-22 ENCOUNTER — Other Ambulatory Visit: Payer: Self-pay | Admitting: Family Medicine

## 2013-08-22 NOTE — Telephone Encounter (Signed)
?   OK to Refill  

## 2013-08-22 NOTE — Telephone Encounter (Signed)
ok 

## 2013-08-23 ENCOUNTER — Other Ambulatory Visit: Payer: Self-pay | Admitting: Family Medicine

## 2013-08-30 ENCOUNTER — Ambulatory Visit: Payer: Medicare Other

## 2013-08-30 ENCOUNTER — Encounter: Payer: Medicare Other | Admitting: Oncology

## 2013-09-20 ENCOUNTER — Ambulatory Visit (HOSPITAL_BASED_OUTPATIENT_CLINIC_OR_DEPARTMENT_OTHER): Payer: Medicare Other

## 2013-09-20 ENCOUNTER — Encounter: Payer: Self-pay | Admitting: Oncology

## 2013-09-20 ENCOUNTER — Ambulatory Visit: Payer: Medicare Other

## 2013-09-20 ENCOUNTER — Telehealth: Payer: Self-pay | Admitting: Oncology

## 2013-09-20 ENCOUNTER — Ambulatory Visit (HOSPITAL_BASED_OUTPATIENT_CLINIC_OR_DEPARTMENT_OTHER): Payer: Medicare Other | Admitting: Oncology

## 2013-09-20 VITALS — BP 153/85 | HR 98 | Temp 98.3°F | Resp 19 | Ht 63.0 in | Wt 115.8 lb

## 2013-09-20 DIAGNOSIS — D649 Anemia, unspecified: Secondary | ICD-10-CM

## 2013-09-20 DIAGNOSIS — G608 Other hereditary and idiopathic neuropathies: Secondary | ICD-10-CM

## 2013-09-20 DIAGNOSIS — G40909 Epilepsy, unspecified, not intractable, without status epilepticus: Secondary | ICD-10-CM

## 2013-09-20 DIAGNOSIS — B182 Chronic viral hepatitis C: Secondary | ICD-10-CM

## 2013-09-20 DIAGNOSIS — B1920 Unspecified viral hepatitis C without hepatic coma: Secondary | ICD-10-CM

## 2013-09-20 DIAGNOSIS — D72821 Monocytosis (symptomatic): Secondary | ICD-10-CM

## 2013-09-20 LAB — CBC & DIFF AND RETIC
Basophils Absolute: 0 10*3/uL (ref 0.0–0.1)
EOS%: 0.3 % (ref 0.0–7.0)
Eosinophils Absolute: 0 10*3/uL (ref 0.0–0.5)
MCH: 31.5 pg (ref 25.1–34.0)
MCV: 90.9 fL (ref 79.5–101.0)
MONO#: 1.1 10*3/uL — ABNORMAL HIGH (ref 0.1–0.9)
MONO%: 8.8 % (ref 0.0–14.0)
NEUT#: 8.7 10*3/uL — ABNORMAL HIGH (ref 1.5–6.5)
RBC: 3.94 10*6/uL (ref 3.70–5.45)
RDW: 12.8 % (ref 11.2–14.5)
Retic %: 0.96 % (ref 0.70–2.10)
Retic Ct Abs: 37.82 10*3/uL (ref 33.70–90.70)

## 2013-09-20 LAB — MORPHOLOGY

## 2013-09-20 LAB — LACTATE DEHYDROGENASE: LDH: 130 U/L (ref 94–250)

## 2013-09-20 NOTE — Progress Notes (Signed)
New Patient Hematology-Oncology Evaluation   Catherine Gonzalez [id number] 07-28-1951 62 y.o. 09/20/2013  CC: Dr. Patrice Paradise; Dr. Melvia Heaps   Reason for referral: Evaluate normochromic anemia   HPI:  New patient evaluation for this pleasant 62 year old woman diagnosed with hepatitis C about 2 years ago. She likely contracted this from blood transfusions given back in the 1970s when she had a benign brain tumor(meningioma?) removed from the right parietal region. She subsequently developed a seizure disorder and is on chronic keppra anticonvulsant. Lab values available back as far as 03/19/2013 when hemoglobin was 10.8, hematocrit 31, MCV 89, white count 6500 with 39% neutrophils, 38% lymphocytes, 22% monocytes. Labs done on 05/04/2013 with hemoglobin 8.4, hematocrit 24, MCV 92, white count 6400, 60% neutrophils, 19% lymphocytes, 12% monocytes. Platelets 286,000. Serum total protein 6.9, albumin 4.1, SGOT 45, SGPT 28, total bilirubin 0.3, alkaline phosphatase 64, BUN 14, creatinine 1.0, folic acid greater than 25, B12 575, serum iron 83, ferritin 202. Hepatitis C quantitative RNA not detected at that time . She does relate she was treated by a Dr. Teena Dunk in [place], [place],  with peginterferon, ribavirin,and boceprevir? but had to stop the drugs after 2 or 3 months due to intolerance.  She had an initial guaiac-positive stool but subsequently she has had multiple negative Hemoccult stools. She underwent upper endoscopy and (05/25/2013) and colonoscopy (05/16/2013) by Dr. Melvia Heaps. A tiny flat polyp removed in the cecum.  She has not noticed any clinical bleeding and denies any epistaxis, hematochezia, melena, hematuria, or vaginal bleeding. She has no signs or symptoms of a collagen vascular disorder. She is a 2 pack per day cigarette smoker who just decreased to 5 cigarettes a day about one year ago. She does not use alcohol. There is no family history of any bleeding  disorder.   PMH: Past Medical History  Diagnosis Date  . Hepatitis C   . Hypertension   . Seizures   . Ectopic pregnancy   . Anemia   . Allergic rhinitis   . Blood transfusion without reported diagnosis   . GERD (gastroesophageal reflux disease)   She does not believe she has hypertension so therefore stopped taking her antihypertensive medication. She has reflux and uses bicarbonate tablets. No history of ulcers. She denies a history of asthma, emphysema, tuberculosis, mononucleosis, thyroid disease, blood clots.  Past Surgical History  Procedure Laterality Date  . Brain surgery      x 3  . Appendectomy    . Cholecystectomy    . Lumbar spine surgery    . Breast enhancement surgery    . Tonsillectomy and adenoidectomy    . Ectopic pregnancy surgery      Allergies: Allergies  Allergen Reactions  . Codeine Nausea And Vomiting  . Pegasys [Peginterferon Alfa-2a] Hives    Medications: Azor 5-20 one daily for blood pressure which she is not taking. Keppra 750 mg twice a day, sodium bicarbonate tablets 325 mg twice a day, ferrous sulfate 325 mg one daily, Colace 100 mg twice a day when necessary, calcium with vitamin D one daily, Xanax 0.25 mg when necessary sleep   Social History:  She is married her husband accompanies her today. They have no children. Alcohol and tobacco see history of present illness. She is disabled and not working.  Family History:She has a brother age 38 and she is estranged from him. He probably has CLL. Another brother died of complications of alcohol abuse. Family History  Problem Relation Age of Onset  .  Heart failure Mother at age 69    . Lung cancer Father   . Alcohol abuse Brother   . Leukemia Brother: Probably CLL    . Colon cancer Neg Hx     Review of Systems: Hematology:  positive for  easy bruising,  negative for epistaxis, gum bleeding, hematuria, hematochezia ENT ROS: negative for - oral lesions or sore throat Breast VHQ:[id number]  breast implants  Respiratory ROS: negative for - cough, pleuritic pain, shortness of breath or wheezing Cardiovascular ROS: negative for - chest pain, dyspnea on exertion, edema, irregular heartbeat, murmur, orthopnea, palpitations, paroxysmal nocturnal dyspnea or rapid heart rate Gastrointestinal ROS: negative for - abdominal pain, appetite loss, blood in stools, change in bowel habits, constipation, diarrhea, heartburn, hematemesis, melena, nausea/vomiting or swallowing difficulty/pain Genito-Urinary ROS: negative for , dysuria, hematuria, incontinence, menorrhagia, nocturia or urinary frequency/urgency Musculoskeletal ROS: negative for - joint pain, joint stiffness, joint swelling, muscle pain, muscular weakness  Neurological ROS: negative for - behavioral changes, confusion, dizziness, gait disturbance, headaches, impaired coordination/balance, memory loss, positive for intermittent numbness/tingling, of her fingers. Dermatological ROS: negative for rash, positive for ecchymoses which seem to be limited to her left forearm ecchymosis Remaining ROS negative.  Physical Exam: Blood pressure 153/85, pulse 98, temperature 98.3 F (36.8 C), temperature source Oral, resp. rate 19, height 5\' 3"  (1.6 m), weight 115 lb 12.8 oz (52.527 kg), SpO2 99.00%. Wt Readings from Last 3 Encounters:  09/20/13 115 lb 12.8 oz (52.527 kg)  05/25/13 128 lb (58.06 kg)  05/19/13 128 lb (58.06 kg)     General appearance: Thin Caucasian woman slightly jaundiced HENNT: Pharynx no erythema, exudate, mass, or ulcer. No thyromegaly or thyroid nodules Lymph nodes: No cervical, supraclavicular, or axillary lymphadenopathy Breasts: No abnormal skin changes, no dominant mass in either breast; firm bilateral breast implants Lungs: Clear to auscultation, resonant to percussion throughout Heart: Regular rhythm, no murmur, no gallop, no rub, no click, no edema Abdomen: Soft, nontender, normal bowel sounds, no mass, no  organomegaly Extremities: No edema, no calf tenderness. Fingernails are yellow Musculoskeletal: no joint deformities GU:  Vascular: Carotid pulses 2+, no bruits,  Neurologic: Alert, oriented, PERRLA, optic discs sharp and vessels normal, no hemorrhage or exudate, cranial nerves grossly normal, motor strength 5 over 5, reflexes 1+ symmetric, upper body coordination normal, gait normal, Skin: No rash; multiple scattered ecchymoses left forearm. No palpable purpura      Lab Results: Lab Results  Component Value Date   WBC 6.4 05/04/2013   HGB 8.4* 05/04/2013   HCT 24.2* 05/04/2013   MCV 92.1 05/04/2013   PLT 286 05/04/2013     Chemistry      Component Value Date/Time   NA 128* 05/04/2013 1703   K 4.1 05/04/2013 1703   CL 94* 05/04/2013 1703   CO2 25 05/04/2013 1703   BUN 14 05/04/2013 1703   CREATININE 0.99 05/04/2013 1703   CREATININE 0.60 03/21/2013 0420      Component Value Date/Time   CALCIUM 9.5 05/04/2013 1703   ALKPHOS 64 05/04/2013 1703   AST 45* 05/04/2013 1703   ALT 28 05/04/2013 1703   BILITOT 0.3 05/04/2013 1703    Hemoglobin 12.4, hematocrit 35.8, MCV 90.9, white count 12,000, 72% neutrophils, 18% lymphocytes, 9% monocytes, platelets 255,000; reticulocyte count 1%:09/20/13    Review of peripheral blood film: Normochromic normocytic red cells, no inclusions, no spherocytes or schistocytes. Mature neutrophils. Increased number of benign-appearing monocytes. Normal platelets. Rare benign reactive lymphocyte.    Impression and Plan: #  1. Transient normochromic anemia in a lady with underlying hepatitis C infection.  It is really not clear why her hemoglobin fell down to 8.4 back in June. I don't know the time frame that she was on hepatitis C therapy. Interferon  has been associated with rare episodes of hemolytic anemia. There are no signs of acute or chronic blood loss. She reports that she feels better since she has been on iron but iron studies were normal back in June when her hemoglobin  was at its lowest. I'm going to go ahead and check a few more baseline laboratories including other parameters of hemolysis and a serum protein and immunofixation electrophoresis. I think if these tests are unremarkable I would not put her through a bone marrow biopsy for hemoglobin that has improved this dramatically in a short time.  #2. Hepatitis C. It looks that she got complete viral suppression from just 2-3 months of treatment.  #3. Peripheral sensory neuropathy No history of alcohol use. Normal B12 and folic acid levels. SPEP pending.  #4. Seizure disorder status post resection of probable meningioma now on chronic Keppra.  #5. Transient monocytosis Etiology also unclear. Monocytosis is usually nonspecific. It can be seen with collagen vascular disorders, fungal diseases, chronic granulomatous processes, tuberculosis, or nonspecifically elevated with underlying solid tumors. Monocyte percent now down to the upper normal range at 10%. She is a heavy cigarette smoker and therefore at risk for lung cancer. However chest radiograph done 03/20/2013 was normal.      Levert Feinstein, MD 09/20/2013, 4:19 PM

## 2013-09-20 NOTE — Telephone Encounter (Signed)
Emailed MD regarding appt on 11/14th, it is Dr. Timoteo Expose  PAL day, waiting for answer

## 2013-09-20 NOTE — Progress Notes (Signed)
Checked in new pt with no financial concerns. °

## 2013-09-22 ENCOUNTER — Telehealth: Payer: Self-pay | Admitting: Oncology

## 2013-09-22 ENCOUNTER — Other Ambulatory Visit: Payer: Self-pay | Admitting: Family Medicine

## 2013-09-22 NOTE — Telephone Encounter (Signed)
rx called in

## 2013-09-22 NOTE — Telephone Encounter (Signed)
Called pt left meesage regarding 11/14 with Md no labs,

## 2013-09-22 NOTE — Telephone Encounter (Signed)
Last RF 9/23  OK refill?

## 2013-09-22 NOTE — Telephone Encounter (Signed)
Talked to pt and she is aware of appt cancelled on November 2014

## 2013-09-22 NOTE — Telephone Encounter (Signed)
ok 

## 2013-09-25 LAB — IMMUNOFIXATION ELECTROPHORESIS
IgA: 58 mg/dL — ABNORMAL LOW (ref 69–380)
IgG (Immunoglobin G), Serum: 1210 mg/dL (ref 690–1700)

## 2013-09-29 ENCOUNTER — Encounter: Payer: Self-pay | Admitting: Family Medicine

## 2013-09-29 ENCOUNTER — Ambulatory Visit (INDEPENDENT_AMBULATORY_CARE_PROVIDER_SITE_OTHER): Payer: Medicare Other | Admitting: Family Medicine

## 2013-09-29 VITALS — BP 168/92 | HR 78 | Temp 97.8°F | Resp 18 | Wt 117.0 lb

## 2013-09-29 DIAGNOSIS — M25511 Pain in right shoulder: Secondary | ICD-10-CM

## 2013-09-29 DIAGNOSIS — M25519 Pain in unspecified shoulder: Secondary | ICD-10-CM

## 2013-09-29 MED ORDER — CYCLOBENZAPRINE HCL 10 MG PO TABS: 10.0000 mg | ORAL_TABLET | Freq: Three times a day (TID) | ORAL | Status: DC | PRN

## 2013-09-29 NOTE — Progress Notes (Signed)
Subjective:    Patient ID: Catherine Gonzalez, female    DOB: May 22, 1951, 62 y.o.   MRN: [id number]  HPI  Patient reports a one-week history of pain in the posterior aspect of her right shoulder and in the lateral portion of the right neck. She denies any specific injury. She denies any numbness or tingling radiating down her right arm into her right hand. She denies any falls or she may have been injured.  She does use her right arm predominantly and she is concerned she may have strained something doing overhead activities. Chest pain with abduction of the arm greater than 90. She also has pain in the lateral portion of the trapezius near its insertion in the posterior right shoulder. Past Medical History  Diagnosis Date  . Hepatitis C   . Hypertension   . Seizures   . Ectopic pregnancy   . Anemia   . Allergic rhinitis   . Blood transfusion without reported diagnosis   . GERD (gastroesophageal reflux disease)    Current Outpatient Prescriptions on File Prior to Visit  Medication Sig Dispense Refill  . ALPRAZolam (XANAX) 0.25 MG tablet TAKE 1 TABLET BY MOUTH ONCE A DAY AS NEEDED FOR SLEEP *PAYABLE 07/19/13*  30 tablet  0  . AZOR 5-20 MG per tablet TAKE 1 TABLET BY MOUTH EVERY DAY  30 tablet  5  . Calcium Carb-Cholecalciferol (CALCIUM + D3 PO) Take 1 tablet by mouth daily.      Marland Kitchen docusate sodium (COLACE) 100 MG capsule Take 100 mg by mouth 2 (two) times daily as needed for constipation.      . ferrous sulfate 325 (65 FE) MG tablet TAKE 1 TABLET BY MOUTH EVERY DAY WITH BREAKFAST  30 tablet  3  . levETIRAcetam (KEPPRA) 750 MG tablet TAKE 1 TABLET TWICE A DAY  60 tablet  5  . Nutritional Supplements (ESTROVEN NIGHTTIME PO) Take 1 tablet by mouth at bedtime.      . sodium bicarbonate 325 MG tablet Take 325 mg by mouth 2 (two) times daily.       No current facility-administered medications on file prior to visit.   Allergies  Allergen Reactions  . Codeine Nausea And Vomiting  . Pegasys  [Peginterferon Alfa-2a] Hives   History   Social History  . Marital Status: Married    Spouse Name: N/A    Number of Children: 0  . Years of Education: N/A   Occupational History  . Not on file.   Social History Main Topics  . Smoking status: Current Some Day Smoker -- 0.30 packs/day for 40 years    Types: Cigarettes  . Smokeless tobacco: Never Used     Comment: trying to quit smoking.  down to 3 ciggs/ day  . Alcohol Use: No  . Drug Use: No  . Sexual Activity: No   Other Topics Concern  . Not on file   Social History Narrative  . No narrative on file     Review of Systems  All other systems reviewed and are negative.       Objective:   Physical Exam  Vitals reviewed. Cardiovascular: Normal rate and regular rhythm.   Pulmonary/Chest: Effort normal and breath sounds normal.  Musculoskeletal:       Right shoulder: She exhibits decreased range of motion, tenderness, pain and spasm. She exhibits no bony tenderness, no swelling, no effusion, no crepitus and normal strength.  She is tender to palpation along the lateral border of the trapezius near  its insertion on the posterior right shoulder        Assessment & Plan:  1. Pain in joint, shoulder region, right Agree the patient has strained her trapezius and possibly part of the rotator cuff as well did have recommended tincture of time over the next week. I recommended moist heat. I recommended Flexeril one half to one tablet q. 8 hours as needed for pain. Recheck in one week. If symptoms are no better proceed with cortisone injection into the shoulder. Her blood pressure is elevated today but she is in pain.  Recheck next week. The patient also received a flu shot today - cyclobenzaprine (FLEXERIL) 10 MG tablet; Take 1 tablet (10 mg total) by mouth 3 (three) times daily as needed for muscle spasms.  Dispense: 30 tablet; Refill: 0

## 2013-10-13 ENCOUNTER — Ambulatory Visit (HOSPITAL_BASED_OUTPATIENT_CLINIC_OR_DEPARTMENT_OTHER): Payer: Medicare Other | Admitting: Lab

## 2013-10-13 ENCOUNTER — Ambulatory Visit (HOSPITAL_BASED_OUTPATIENT_CLINIC_OR_DEPARTMENT_OTHER): Payer: Medicare Other | Admitting: Oncology

## 2013-10-13 ENCOUNTER — Telehealth: Payer: Self-pay | Admitting: Oncology

## 2013-10-13 VITALS — BP 142/90 | HR 86 | Temp 97.9°F | Resp 17 | Ht 63.0 in | Wt 114.4 lb

## 2013-10-13 DIAGNOSIS — R230 Cyanosis: Secondary | ICD-10-CM

## 2013-10-13 DIAGNOSIS — R58 Hemorrhage, not elsewhere classified: Secondary | ICD-10-CM

## 2013-10-13 DIAGNOSIS — I998 Other disorder of circulatory system: Secondary | ICD-10-CM

## 2013-10-13 DIAGNOSIS — D649 Anemia, unspecified: Secondary | ICD-10-CM

## 2013-10-13 DIAGNOSIS — B1920 Unspecified viral hepatitis C without hepatic coma: Secondary | ICD-10-CM

## 2013-10-13 LAB — CBC WITH DIFFERENTIAL/PLATELET
BASO%: 1 % (ref 0.0–2.0)
Eosinophils Absolute: 0.1 10*3/uL (ref 0.0–0.5)
HCT: 37.7 % (ref 34.8–46.6)
LYMPH%: 25.8 % (ref 14.0–49.7)
MCH: 30.8 pg (ref 25.1–34.0)
MCHC: 33.5 g/dL (ref 31.5–36.0)
MCV: 91.8 fL (ref 79.5–101.0)
MONO#: 0.8 10*3/uL (ref 0.1–0.9)
MONO%: 8.2 % (ref 0.0–14.0)
NEUT%: 64 % (ref 38.4–76.8)
Platelets: 292 10*3/uL (ref 145–400)
RBC: 4.11 10*6/uL (ref 3.70–5.45)

## 2013-10-13 NOTE — Telephone Encounter (Signed)
worked 11/14 POF added lab on for today AVS and Cal printed shh

## 2013-10-13 NOTE — Progress Notes (Signed)
Hematology and Oncology Follow Up Visit  Catherine Gonzalez [id number] 08-16-1951 62 y.o. 10/13/2013 6:14 PM   Principle Diagnosis: Encounter Diagnoses  Name Primary?  Marland Kitchen Anemia Yes  . Ecchymosis   . Cyanosis of tip of finger      Interim History:   Short interval followup visit for this 62 year old woman with known hepatitis C infection referred here for evaluation of anemia. Please see my 09/29/2013 office consultation for complete details.  Hemoglobin recorded as low as 8.4 with MCV 92 back in June of this year. However by the time of her visit with me hemoglobin was up to 12.4. No abnormalities on review of the peripheral blood film. No evidence for a hemolytic process. Normal iron studies including ferritin done back in June. Normal B12 level. Minimal liver function abnormalities with isolated mild elevation of SGOT at 45 units. Bilirubin 0.3. Despite only a short interval of treatment, hepatitis C RNA was not detectable on an acid done 05/08/2013.  Her exam was remarkable for very extensive patchy ecchymoses on her forearms. No lymphadenopathy. No organomegaly. Repeat exam today with fairly extensive cyanotic changes of her fingers and some ulnar deviation of the joints.  I obtained additional laboratory studies: Serum immunoglobulins normal IgG, IgM, with slightly decreased IgA. No monoclonal proteins on IFE. Previous urinalysis back in May with no hematuria or proteinuria  CBC today with further slight improvement in her hemoglobin now 12.6.       Medications: reviewed  Allergies:  Allergies  Allergen Reactions  . Codeine Nausea And Vomiting  . Pegasys [Peginterferon Alfa-2a] Hives      Physical Exam: Blood pressure 142/90, pulse 86, temperature 97.9 F (36.6 C), temperature source Oral, resp. rate 17, height 5\' 3"  (1.6 m), weight 114 lb 6.4 oz (51.891 kg), SpO2 100.00%. Wt Readings from Last 3 Encounters:  10/13/13 114 lb 6.4 oz (51.891 kg)  09/29/13 117 lb  (53.071 kg)  09/20/13 115 lb 12.8 oz (52.527 kg)   complete exam done on October 31. Exam today limited to the upper extremities which still shows extensive patchy ecchymoses of her forearms, thin skin, and cyanotic changes of the fingers. Ulnar deviation of the joints.   Lab Results: CBC W/Diff    Component Value Date/Time   WBC 10.0 10/13/2013 1532   WBC 6.4 05/04/2013 1703   RBC 4.11 10/13/2013 1532   RBC 2.65* 05/04/2013 1703   HGB 12.6 10/13/2013 1532   HGB 8.4* 05/04/2013 1703   HCT 37.7 10/13/2013 1532   HCT 24.2* 05/04/2013 1703   PLT 292 10/13/2013 1532   PLT 286 05/04/2013 1703   MCV 91.8 10/13/2013 1532   MCV 92.1 05/04/2013 1703   MCH 30.8 10/13/2013 1532   MCH 31.7 05/04/2013 1703   MCHC 33.5 10/13/2013 1532   MCHC 34.4 05/04/2013 1703   RDW 13.1 10/13/2013 1532   RDW 15.5 05/04/2013 1703   LYMPHSABS 2.6 10/13/2013 1532   LYMPHSABS 1.2 05/04/2013 1703   MONOABS 0.8 10/13/2013 1532   MONOABS 0.8 05/04/2013 1703   EOSABS 0.1 10/13/2013 1532   EOSABS 0.0 05/04/2013 1703   BASOSABS 0.1 10/13/2013 1532   BASOSABS 0.0 05/04/2013 1703     Chemistry      Component Value Date/Time   NA 128* 05/04/2013 1703   K 4.1 05/04/2013 1703   CL 94* 05/04/2013 1703   CO2 25 05/04/2013 1703   BUN 14 05/04/2013 1703   CREATININE 0.99 05/04/2013 1703   CREATININE 0.60 03/21/2013 0420  Component Value Date/Time   CALCIUM 9.5 05/04/2013 1703   ALKPHOS 64 05/04/2013 1703   AST 45* 05/04/2013 1703   ALT 28 05/04/2013 1703   BILITOT 0.3 05/04/2013 1703      Impression: #1. Spontaneous improvement in hemoglobin which correlated with successful treatment of hepatitis C infection. No GI pathology on upper or lower endoscopy. Myeloma screen negative. No evidence for a hemolytic process. No indication for bone marrow biopsy at this time.  #2. Extensive purpura of her arms and cyanotic changes of her fingers Vasculitis and cryoglobulinemia has been associated with hepatitis C infection. Ongoing to screen her for  possibility of this with ANA, rheumatoid factor, ESR, and cryoglobulins.   CC: Patient Care Team: Donita Brooks, MD as PCP - General (Family Medicine)   Levert Feinstein, MD 11/14/20146:14 PM

## 2013-10-20 ENCOUNTER — Other Ambulatory Visit: Payer: Self-pay | Admitting: Family Medicine

## 2013-10-20 LAB — ANTI-NUCLEAR AB-TITER (ANA TITER): ANA Titer 1: 1:40 {titer} — ABNORMAL HIGH

## 2013-10-20 LAB — ANCA SCREEN W REFLEX TITER: Atypical p-ANCA Screen: NEGATIVE

## 2013-10-20 LAB — ANA: Anti Nuclear Antibody(ANA): POSITIVE — AB

## 2013-10-20 LAB — SEDIMENTATION RATE: Sed Rate: 27 mm/hr — ABNORMAL HIGH (ref 0–22)

## 2013-10-20 LAB — CRYOGLOBULIN

## 2013-10-20 LAB — RHEUMATOID FACTOR: Rhuematoid fact SerPl-aCnc: 24 IU/mL — ABNORMAL HIGH (ref <=14)

## 2013-10-20 NOTE — Telephone Encounter (Signed)
ok 

## 2013-10-20 NOTE — Telephone Encounter (Signed)
?   OK to Refill  

## 2013-10-23 ENCOUNTER — Other Ambulatory Visit (HOSPITAL_BASED_OUTPATIENT_CLINIC_OR_DEPARTMENT_OTHER): Payer: Medicare Other | Admitting: Lab

## 2013-10-23 ENCOUNTER — Ambulatory Visit (HOSPITAL_BASED_OUTPATIENT_CLINIC_OR_DEPARTMENT_OTHER): Payer: Medicare Other | Admitting: Oncology

## 2013-10-23 ENCOUNTER — Other Ambulatory Visit: Payer: Self-pay | Admitting: Family Medicine

## 2013-10-23 VITALS — BP 144/91 | HR 89 | Temp 98.4°F | Resp 20 | Ht 63.0 in | Wt 113.3 lb

## 2013-10-23 DIAGNOSIS — D649 Anemia, unspecified: Secondary | ICD-10-CM

## 2013-10-23 DIAGNOSIS — B182 Chronic viral hepatitis C: Secondary | ICD-10-CM

## 2013-10-23 LAB — CBC & DIFF AND RETIC
BASO%: 0.3 % (ref 0.0–2.0)
Eosinophils Absolute: 0.1 10*3/uL (ref 0.0–0.5)
HCT: 35.3 % (ref 34.8–46.6)
HGB: 12.1 g/dL (ref 11.6–15.9)
Immature Retic Fract: 2.3 % (ref 1.60–10.00)
LYMPH%: 30.4 % (ref 14.0–49.7)
MCHC: 34.3 g/dL (ref 31.5–36.0)
MONO#: 0.8 10*3/uL (ref 0.1–0.9)
NEUT#: 5.5 10*3/uL (ref 1.5–6.5)
NEUT%: 59.3 % (ref 38.4–76.8)
RDW: 13.6 % (ref 11.2–14.5)
Retic Ct Abs: 33.93 10*3/uL (ref 33.70–90.70)
WBC: 9.3 10*3/uL (ref 3.9–10.3)
lymph#: 2.8 10*3/uL (ref 0.9–3.3)

## 2013-10-23 LAB — CHCC SMEAR

## 2013-10-23 LAB — COMPREHENSIVE METABOLIC PANEL (CC13)
ALT: 19 U/L (ref 0–55)
Anion Gap: 10 mEq/L (ref 3–11)
BUN: 17.8 mg/dL (ref 7.0–26.0)
CO2: 27 mEq/L (ref 22–29)
Calcium: 10.3 mg/dL (ref 8.4–10.4)
Chloride: 99 mEq/L (ref 98–109)
Creatinine: 1 mg/dL (ref 0.6–1.1)
Glucose: 99 mg/dl (ref 70–140)
Sodium: 137 mEq/L (ref 136–145)
Total Bilirubin: 0.24 mg/dL (ref 0.20–1.20)
Total Protein: 8.2 g/dL (ref 6.4–8.3)

## 2013-10-23 LAB — MORPHOLOGY
PLT EST: ADEQUATE
RBC Comments: NORMAL

## 2013-10-23 NOTE — Telephone Encounter (Signed)
?   OK to Refill  

## 2013-10-23 NOTE — Telephone Encounter (Signed)
ok 

## 2013-10-24 NOTE — Progress Notes (Signed)
Short interval followup visit for this 62 year old woman initially referred here for evaluation of anemia when hemoglobin fell down to 8.4 g back in June. She had a negative GI evaluation. At time of initial visit here hemoglobin had normalized on its own up to 12.6 g. She has a history of hepatitis C. She had a brief treatment at time of diagnosis 3 years ago but didn't tolerate interferon ribavirin which was stopped after about 3 months. Despite short treatment interval, assessment of viral load was undetectable when checked in June of 2014.  On my exam, she had cyanotic changes of her hands which were cold to the touch an extensive small ecchymoses on her arms left greater than right. Skin very thin and she states that her mother skin was thin as well.  I ran a number of additional tests at time of visit here and followup visit on November 14. Results not available and I read do these with her and her husband.  Brief exam today limited to her hands and arms show persistent ecchymoses and cyanotic changes. Pulses are normal.  Summary of labs: ANA weakly +1:40 Rheumatoid factor low +24 units normal less than 14 ESR slightly elevated at 27 mm Serum protein electrophoresis shows no monoclonal gammopathy ANCA screen negative Cryoglobulin screen negative LDH 130, reticulocyte count 0.9% Liver chemistries currently normal bilirubin 0.2, alkaline phosphatase 61, SGOT 30, SGPT 19, total protein 8.2, albumin 4.4. (10/23/13). Repeat hemoglobin: 12.1 MCV 90.5  Impression: Mild normochromic anemia with normal white count, differential, and platelet count. Lab findings above suggest possible developing collagen vascular disorder but are nondiagnostic. Recommendation: Monitor blood counts periodically. I don't think a bone marrow biopsy would add any more information at this time.

## 2013-10-24 NOTE — Telephone Encounter (Signed)
#  30 called 11/21

## 2013-10-25 LAB — IMMUNOFIXATION ELECTROPHORESIS
IgA: 58 mg/dL — ABNORMAL LOW (ref 69–380)
IgG (Immunoglobin G), Serum: 1250 mg/dL (ref 690–1700)
IgM, Serum: 213 mg/dL (ref 52–322)
Total Protein, Serum Electrophoresis: 7.8 g/dL (ref 6.0–8.3)

## 2013-11-07 ENCOUNTER — Other Ambulatory Visit: Payer: Self-pay | Admitting: Family Medicine

## 2013-11-07 NOTE — Telephone Encounter (Signed)
Last Rf 10/31 #30  Last OV 10/31  OK refill?

## 2013-11-17 ENCOUNTER — Telehealth: Payer: Self-pay | Admitting: Family Medicine

## 2013-11-17 MED ORDER — ALPRAZOLAM 0.25 MG PO TABS: ORAL_TABLET | ORAL | Status: DC

## 2013-11-17 NOTE — Telephone Encounter (Signed)
ok 

## 2013-11-17 NOTE — Telephone Encounter (Signed)
Wants refill on xanax - .? OK to Refill and to do for 3 months

## 2013-11-17 NOTE — Telephone Encounter (Signed)
Med called to pharm 

## 2013-12-04 ENCOUNTER — Other Ambulatory Visit: Payer: Self-pay | Admitting: Family Medicine

## 2013-12-14 ENCOUNTER — Other Ambulatory Visit: Payer: Self-pay | Admitting: Family Medicine

## 2013-12-14 NOTE — Telephone Encounter (Signed)
Ok to refill 

## 2013-12-15 NOTE — Telephone Encounter (Signed)
Medication refilled per protocol. 

## 2013-12-15 NOTE — Telephone Encounter (Signed)
ok 

## 2013-12-16 ENCOUNTER — Other Ambulatory Visit: Payer: Self-pay | Admitting: Family Medicine

## 2013-12-18 ENCOUNTER — Other Ambulatory Visit: Payer: Self-pay | Admitting: Family Medicine

## 2013-12-18 MED ORDER — AMLODIPINE-OLMESARTAN 5-20 MG PO TABS: ORAL_TABLET | ORAL | Status: DC

## 2013-12-18 NOTE — Telephone Encounter (Signed)
Rx Refilled  

## 2014-01-29 ENCOUNTER — Encounter: Payer: Self-pay | Admitting: Oncology

## 2014-02-02 ENCOUNTER — Ambulatory Visit (INDEPENDENT_AMBULATORY_CARE_PROVIDER_SITE_OTHER): Payer: Medicare Other | Admitting: Family Medicine

## 2014-02-02 ENCOUNTER — Encounter: Payer: Self-pay | Admitting: Family Medicine

## 2014-02-02 VITALS — BP 128/84 | HR 60 | Temp 98.4°F | Resp 20 | Ht 62.0 in | Wt 112.0 lb

## 2014-02-02 DIAGNOSIS — J069 Acute upper respiratory infection, unspecified: Secondary | ICD-10-CM

## 2014-02-02 MED ORDER — AZITHROMYCIN 250 MG PO TABS: ORAL_TABLET | ORAL | Status: DC

## 2014-02-02 MED ORDER — FLUTICASONE PROPIONATE 50 MCG/ACT NA SUSP: 2.0000 | Freq: Every day | NASAL | Status: DC

## 2014-02-02 NOTE — Patient Instructions (Signed)
Start antibiotics if not better on Sunday Take mucinex DM or Robitussin DM Use flonase for the nasal drainage F/u as needed

## 2014-02-04 NOTE — Progress Notes (Signed)
Patient ID: Catherine Gonzalez, female   DOB: 1951/11/20, 63 y.o.   MRN: [id number]   Subjective:    Patient ID: Catherine Gonzalez, female    DOB: 05-11-1951, 63 y.o.   MRN: [id number]  Patient presents for Illness  patient here with cough with minimal production runny nose sinus drainage for the past 3 days. Positive sick contact with her brother. She does have history of hepatitis C however this is currently in remission and she's not on any medications at this time. She's not tried any over-the-counter medications for her cough and congestion. She's not had any fever or shortness of breath.    Review Of Systems:  GEN- denies fatigue, fever, weight loss,weakness, recent illness HEENT- denies eye drainage, change in vision,+ nasal discharge, CVS- denies chest pain, palpitations RESP- denies SOB,+ cough, wheeze ABD- denies N/V, change in stools, abd pain Neuro- denies headache, dizziness, syncope, seizure activity       Objective:    BP 128/84  Pulse 60  Temp(Src) 98.4 F (36.9 C)  Resp 20  Ht 5\' 2"  (1.575 m)  Wt 112 lb (50.803 kg)  BMI 20.48 kg/m2 GEN- NAD, alert and oriented x3 HEENT- PERRL, EOMI, non injected sclera, pink conjunctiva, MMM, oropharynx clear, +rhinorrhea,mild maxillary sinus tenderness Neck- Supple, no LAD CVS- RRR, no murmur RESP-CTAB EXT- No edema Pulses- Radial 2+        Assessment & Plan:      Problem List Items Addressed This Visit   None    Visit Diagnoses   URI, acute    -  Primary    Viral URI at this point, as she does have Hep C and other comorbidites given Zpak to start Sunday if not better, mucinex DM, flonase    Relevant Medications       azithromycin (ZITHROMAX) tablet       Note: This dictation was prepared with Dragon dictation along with smaller phrase technology. Any transcriptional errors that result from this process are unintentional.

## 2014-02-09 ENCOUNTER — Other Ambulatory Visit: Payer: Self-pay | Admitting: Family Medicine

## 2014-02-09 NOTE — Telephone Encounter (Signed)
Received refill request for Z-pack.   States that she completed ABt yesterday, but is still not felling any better.   Advised that ABT is still in system.   Advised if S/Sx persist, to contact office on Monday.

## 2014-02-11 ENCOUNTER — Other Ambulatory Visit: Payer: Self-pay | Admitting: Family Medicine

## 2014-02-14 ENCOUNTER — Telehealth: Payer: Self-pay | Admitting: Family Medicine

## 2014-02-14 NOTE — Telephone Encounter (Signed)
Requesting refill on Xanax - ? OK to Refill  

## 2014-02-15 MED ORDER — ALPRAZOLAM 0.25 MG PO TABS: ORAL_TABLET | ORAL | Status: DC

## 2014-02-15 NOTE — Telephone Encounter (Signed)
Med c/o x 3 months

## 2014-02-15 NOTE — Telephone Encounter (Signed)
ok 

## 2014-03-23 ENCOUNTER — Other Ambulatory Visit: Payer: Self-pay | Admitting: Family Medicine

## 2014-03-23 NOTE — Telephone Encounter (Signed)
Refill appropriate and filled per protocol. 

## 2014-03-26 ENCOUNTER — Other Ambulatory Visit: Payer: Self-pay | Admitting: Family Medicine

## 2014-04-02 ENCOUNTER — Encounter: Payer: Self-pay | Admitting: Oncology

## 2014-04-02 ENCOUNTER — Ambulatory Visit (INDEPENDENT_AMBULATORY_CARE_PROVIDER_SITE_OTHER): Payer: Medicare Other | Admitting: Oncology

## 2014-04-02 VITALS — BP 138/80 | HR 60 | Temp 96.3°F | Resp 20 | Ht 63.5 in | Wt 108.3 lb

## 2014-04-02 DIAGNOSIS — B182 Chronic viral hepatitis C: Secondary | ICD-10-CM

## 2014-04-02 DIAGNOSIS — D649 Anemia, unspecified: Secondary | ICD-10-CM

## 2014-04-02 LAB — CBC WITH DIFFERENTIAL/PLATELET
Basophils Absolute: 0.1 10*3/uL (ref 0.0–0.1)
Basophils Relative: 1 % (ref 0–1)
EOS ABS: 0.2 10*3/uL (ref 0.0–0.7)
Eosinophils Relative: 2 % (ref 0–5)
HCT: 37.4 % (ref 36.0–46.0)
Hemoglobin: 13 g/dL (ref 12.0–15.0)
LYMPHS PCT: 27 % (ref 12–46)
Lymphs Abs: 2.2 10*3/uL (ref 0.7–4.0)
MCH: 30.9 pg (ref 26.0–34.0)
MCHC: 34.8 g/dL (ref 30.0–36.0)
MCV: 88.8 fL (ref 78.0–100.0)
MONOS PCT: 9 % (ref 3–12)
Monocytes Absolute: 0.7 10*3/uL (ref 0.1–1.0)
NEUTROS ABS: 4.9 10*3/uL (ref 1.7–7.7)
Neutrophils Relative %: 61 % (ref 43–77)
Platelets: 272 10*3/uL (ref 150–400)
RBC: 4.21 MIL/uL (ref 3.87–5.11)
RDW: 13.9 % (ref 11.5–15.5)
WBC: 8.1 10*3/uL (ref 4.0–10.5)

## 2014-04-02 LAB — SAVE SMEAR

## 2014-04-02 NOTE — Patient Instructions (Signed)
Lab here today Repeat lab in 1 year 04/01/15 Visit with Dr Beryle Beams 15 minutes Apr 08, 2015 We will call you if any problems with today's lab results

## 2014-04-02 NOTE — Progress Notes (Signed)
Patient ID: Catherine Gonzalez, female   DOB: 12-May-1951, 63 y.o.   MRN: [id number] Hematology and Oncology Follow Up Visit  Catherine Gonzalez [id number] 1951/03/20 62 y.o. 04/02/2014 1:20 PM   Principle Diagnosis: Encounter Diagnoses  Name Primary?  Marland Kitchen Anemia Yes  . Hep C w/o coma, chronic      Interim History:   Six-month interval followup visit for this pleasant 63 year old woman initially referred for evaluation of anemia back in October of 2014. She had a history of hepatitis C diagnosed approximately 2012 contracted from blood transfusions given back in the 70s when she underwent surgery for a benign brain tumor. She had a chronic mild anemia with baseline hemoglobin 11 g but in June of 2014 a hemoglobin was recorded at 8.4 g with MCV 92, normal white count, differential, and platelet count. Normal renal function. Normal W26 and folic acid levels. Normal iron and ferritin levels. Hepatitis C quantitative RNA was not detected. Upper endoscopy and colonoscopy done in June 2014 negative except for benign polyp removed from the cecum. At the time of her first visit with me on 09/20/2013, hemoglobin and already improved back to her baseline at 12.4 g. Reason for the fall was not clear. Urinalysis showed no proteinuria or hematuria. At time of a followup visit on 10/13/2013 hemoglobin was stable at 12.6 g. The only physical finding that has remained constant was extensive purpura of her arms and cyanotic changes of her fingers.  I checked additional labs. ANA weakly +1:40  Rheumatoid factor low +24 units normal less than 14  ESR slightly elevated at 27 mm  Serum protein electrophoresis shows no monoclonal gammopathy  ANCA screen negative  Cryoglobulin screen negative  LDH 130, reticulocyte count 0.9% I thought perhaps that she was developing a collagen vascular disorder.  She has had no interim medical problems. She was treated for an outpatient sinus infection back in March.   She continues to  have cyanotic changes of both of her hands and extensive purpura limited to her left arm.   Medications: reviewed  Allergies:  Allergies  Allergen Reactions  . Codeine Nausea And Vomiting  . Pegasys [Peginterferon Alfa-2a] Hives    Review of Systems: Hematology:  No bleeding. ENT ROS: No sore throat. Recent sinus infection. Breast ROS:  Respiratory ROS: No cough or dyspnea Cardiovascular ROS:  No chest pain or palpitations Gastrointestinal ROS: No abdominal pain. No change in bowel habits. No hematochezia. No melena.   Genito-Urinary ROS: No urinary tract symptoms. No hematuria Musculoskeletal ROS: No muscle bone or joint pain. Neurological ROS: No headache or change in vision. No paresthesias. Dermatological ROS: See above Remaining ROS negative:   Physical Exam: Blood pressure 138/80, pulse 60, temperature 96.3 F (35.7 C), temperature source Oral, resp. rate 20, height 5' 3.5" (1.613 m), weight 108 lb 4.8 oz (49.125 kg), SpO2 99.00%. Wt Readings from Last 3 Encounters:  04/02/14 108 lb 4.8 oz (49.125 kg)  02/02/14 112 lb (50.803 kg)  10/23/13 113 lb 4.8 oz (51.393 kg)     General appearance: Thin Caucasian woman HENNT: Pharynx no erythema, exudate, mass, or ulcer. No thyromegaly or thyroid nodules Lymph nodes: No cervical, supraclavicular, or axillary lymphadenopathy Breasts: Bilateral firm breast implants Lungs: Clear to auscultation, resonant to percussion throughout Heart: Regular rhythm, no murmur, no gallop, no rub, no click, no edema Abdomen: Soft, nontender, normal bowel sounds, no mass, no organomegaly Extremities: No edema, no calf tenderness Musculoskeletal: no joint deformities GU:  Vascular: Carotid pulses 2+, no bruits,  distal pulses: Dorsalis pedis 1+ symmetric. Persistent cyanotic changes of all of her fingers of both hands. Radial pulses 1+ symmetric. Ulnar pulse is not palpable but symmetric. Neurologic: Alert, oriented, PERRLA, optic discs sharp and  vessels normal, no hemorrhage or exudate, cranial nerves grossly normal, motor strength 5 over 5, reflexes 1+ symmetric, upper body coordination normal, gait normal, Skin: Thin skin. Multiple ecchymoses oddly limited to the skin of her left forearm.  Lab Results: CBC W/Diff    Component Value Date/Time   WBC 9.3 10/23/2013 1530   WBC 6.4 05/04/2013 1703   RBC 3.90 10/23/2013 1530   RBC 2.65* 05/04/2013 1703   HGB 12.1 10/23/2013 1530   HGB 8.4* 05/04/2013 1703   HCT 35.3 10/23/2013 1530   HCT 24.2* 05/04/2013 1703   PLT 252 10/23/2013 1530   PLT 286 05/04/2013 1703   MCV 90.5 10/23/2013 1530   MCV 92.1 05/04/2013 1703   MCH 31.0 10/23/2013 1530   MCH 31.7 05/04/2013 1703   MCHC 34.3 10/23/2013 1530   MCHC 34.4 05/04/2013 1703   RDW 13.6 10/23/2013 1530   RDW 15.5 05/04/2013 1703   LYMPHSABS 2.8 10/23/2013 1530   LYMPHSABS 1.2 05/04/2013 1703   MONOABS 0.8 10/23/2013 1530   MONOABS 0.8 05/04/2013 1703   EOSABS 0.1 10/23/2013 1530   EOSABS 0.0 05/04/2013 1703   BASOSABS 0.0 10/23/2013 1530   BASOSABS 0.0 05/04/2013 1703     Chemistry      Component Value Date/Time   NA 137 10/23/2013 1530   NA 128* 05/04/2013 1703   K 4.3 10/23/2013 1530   K 4.1 05/04/2013 1703   CL 94* 05/04/2013 1703   CO2 27 10/23/2013 1530   CO2 25 05/04/2013 1703   BUN 17.8 10/23/2013 1530   BUN 14 05/04/2013 1703   CREATININE 1.0 10/23/2013 1530   CREATININE 0.99 05/04/2013 1703   CREATININE 0.60 03/21/2013 0420      Component Value Date/Time   CALCIUM 10.3 10/23/2013 1530   CALCIUM 9.5 05/04/2013 1703   ALKPHOS 61 10/23/2013 1530   ALKPHOS 64 05/04/2013 1703   AST 30 10/23/2013 1530   AST 45* 05/04/2013 1703   ALT 19 10/23/2013 1530   ALT 28 05/04/2013 1703   BILITOT 0.24 10/23/2013 1530   BILITOT 0.3 05/04/2013 1703    Lab today process 04/02/2014): Hemoglobin better than her baseline at 13 g compare with 12 g back in November, white count 8100, 61% neutrophils, 27% lymphocytes, 9% monocytes, platelets 272,000 Chemistry profile:  New, mild, transaminase elevations SGOT 45 SGPT 46 remainder of liver functions including LDH normal   Impression:  Possible developing collagen vascular disorder with positive rheumatoid factor and vasculitic changes of her skin. This may all be secondary to underlying chronic hepatitis C infection. I will repeat a quantitative RNA study at this time since viral levels were last checked about one year ago but undetectable at that time.  Evaluation to date as summarized above unremarkable for any major pathology. Hemoglobin improved compared with previous values.  I do not plan any further diagnostic testing at this time.   CC: Patient Care Team: Susy Frizzle, MD as PCP - General (Family Medicine)   Annia Belt, MD 5/4/20151:20 PM

## 2014-04-03 LAB — COMPLETE METABOLIC PANEL WITH GFR
ALK PHOS: 55 U/L (ref 39–117)
ALT: 46 U/L — ABNORMAL HIGH (ref 0–35)
AST: 45 U/L — ABNORMAL HIGH (ref 0–37)
Albumin: 5 g/dL (ref 3.5–5.2)
BILIRUBIN TOTAL: 0.4 mg/dL (ref 0.2–1.2)
BUN: 11 mg/dL (ref 6–23)
CO2: 29 meq/L (ref 19–32)
CREATININE: 0.86 mg/dL (ref 0.50–1.10)
Calcium: 10.3 mg/dL (ref 8.4–10.5)
Chloride: 96 mEq/L (ref 96–112)
GFR, EST AFRICAN AMERICAN: 85 mL/min
GFR, Est Non African American: 74 mL/min
Glucose, Bld: 91 mg/dL (ref 70–99)
Potassium: 4.3 mEq/L (ref 3.5–5.3)
SODIUM: 134 meq/L — AB (ref 135–145)
TOTAL PROTEIN: 7.9 g/dL (ref 6.0–8.3)

## 2014-04-03 LAB — LACTATE DEHYDROGENASE: LDH: 122 U/L (ref 94–250)

## 2014-04-11 ENCOUNTER — Other Ambulatory Visit: Payer: Self-pay | Admitting: Family Medicine

## 2014-04-11 MED ORDER — AMLODIPINE-OLMESARTAN 5-20 MG PO TABS: ORAL_TABLET | ORAL | Status: DC

## 2014-04-11 MED ORDER — LEVETIRACETAM 750 MG PO TABS: ORAL_TABLET | ORAL | Status: AC

## 2014-04-11 NOTE — Telephone Encounter (Signed)
Rx Refilled for 90 day supply 

## 2014-04-12 ENCOUNTER — Telehealth: Payer: Self-pay | Admitting: *Deleted

## 2014-04-12 MED ORDER — FLUTICASONE PROPIONATE 50 MCG/ACT NA SUSP: 2.0000 | Freq: Every day | NASAL | Status: DC

## 2014-04-12 NOTE — Telephone Encounter (Signed)
Meds refilled.

## 2014-05-11 ENCOUNTER — Encounter: Payer: Self-pay | Admitting: Family Medicine

## 2014-05-11 ENCOUNTER — Telehealth: Payer: Self-pay | Admitting: Family Medicine

## 2014-05-11 MED ORDER — ALPRAZOLAM 0.25 MG PO TABS: ORAL_TABLET | ORAL | Status: DC

## 2014-05-11 NOTE — Telephone Encounter (Addendum)
Pharmacy request for refill Alprazolam 0.25 mg  Last RF 3/19 #30 + 2.   Last routine OV and lab work has been over one year.  OK refill?

## 2014-05-11 NOTE — Telephone Encounter (Signed)
Tried to call patient.  No answer.  Letter to patient to schedule appt

## 2014-05-11 NOTE — Telephone Encounter (Signed)
NTBS but can have 1 month supply.

## 2014-06-05 ENCOUNTER — Ambulatory Visit (INDEPENDENT_AMBULATORY_CARE_PROVIDER_SITE_OTHER): Payer: Medicare Other | Admitting: Family Medicine

## 2014-06-05 ENCOUNTER — Encounter: Payer: Self-pay | Admitting: Family Medicine

## 2014-06-05 VITALS — BP 122/84 | HR 68 | Temp 97.0°F | Resp 16 | Ht 63.5 in | Wt 103.0 lb

## 2014-06-05 DIAGNOSIS — B182 Chronic viral hepatitis C: Secondary | ICD-10-CM

## 2014-06-05 DIAGNOSIS — I10 Essential (primary) hypertension: Secondary | ICD-10-CM

## 2014-06-05 LAB — COMPLETE METABOLIC PANEL WITH GFR
ALBUMIN: 5.1 g/dL (ref 3.5–5.2)
ALT: 33 U/L (ref 0–35)
AST: 36 U/L (ref 0–37)
Alkaline Phosphatase: 45 U/L (ref 39–117)
BUN: 11 mg/dL (ref 6–23)
CALCIUM: 10.1 mg/dL (ref 8.4–10.5)
CHLORIDE: 96 meq/L (ref 96–112)
CO2: 27 mEq/L (ref 19–32)
CREATININE: 0.87 mg/dL (ref 0.50–1.10)
GFR, EST AFRICAN AMERICAN: 84 mL/min
GFR, EST NON AFRICAN AMERICAN: 73 mL/min
GLUCOSE: 92 mg/dL (ref 70–99)
POTASSIUM: 4.4 meq/L (ref 3.5–5.3)
Sodium: 135 mEq/L (ref 135–145)
Total Bilirubin: 0.4 mg/dL (ref 0.2–1.2)
Total Protein: 7.5 g/dL (ref 6.0–8.3)

## 2014-06-05 LAB — LIPID PANEL
CHOLESTEROL: 210 mg/dL — AB (ref 0–200)
HDL: 61 mg/dL (ref >39)
LDL Cholesterol: 132 mg/dL — ABNORMAL HIGH (ref 0–99)
Total CHOL/HDL Ratio: 3.4 Ratio
Triglycerides: 84 mg/dL (ref <150)
VLDL: 17 mg/dL (ref 0–40)

## 2014-06-05 MED ORDER — ZOLPIDEM TARTRATE 10 MG PO TABS: 10.0000 mg | ORAL_TABLET | Freq: Every evening | ORAL | Status: DC | PRN

## 2014-06-05 NOTE — Progress Notes (Signed)
Subjective:    Patient ID: Catherine Gonzalez, female    DOB: 06-15-1951, 63 y.o.   MRN: [id number]  HPI Patient presents today for followup of hypertension. She is currently on azor 5/20 one by mouth daily. She denies any chest pain shortness of breath or dyspnea on exertion. Unfortunately she continues to smoke. She also is taking Xanax 0.5 mg by mouth each bedtime for insomnia. This medication is ineffective. She is interested in trying something else to help her sleep. Does have history of chronic hepatitis C. She was treated with interferon for 3 months.  She has not had a viral load checked in over a year. Her last liver function tests at hem/oncwas slightly elevated.  She is overdue for a fasting lipid panel. She is overdue for a Pap smear as well as a mammogram. Past Medical History  Diagnosis Date  . Hepatitis C   . Hypertension   . Seizures   . Ectopic pregnancy   . Anemia   . Allergic rhinitis   . Blood transfusion without reported diagnosis   . GERD (gastroesophageal reflux disease)    Current Outpatient Prescriptions on File Prior to Visit  Medication Sig Dispense Refill  . ALPRAZolam (XANAX) 0.25 MG tablet TAKE 1 TABLET DAILY AS NEEDED FOR SLEEP.  30 tablet  0  . amLODipine-olmesartan (AZOR) 5-20 MG per tablet TAKE 1 TABLET BY MOUTH EVERY DAY  90 tablet  3  . Calcium Carb-Cholecalciferol (CALCIUM + D3 PO) Take 1 tablet by mouth daily.      Marland Kitchen docusate sodium (COLACE) 100 MG capsule Take 100 mg by mouth 2 (two) times daily as needed for constipation.      . ferrous sulfate 325 (65 FE) MG tablet TAKE 1 TABLET BY MOUTH EVERY DAY WITH BREAKFAST  30 tablet  11  . ferrous sulfate 325 (65 FE) MG tablet TAKE 1 TABLET BY MOUTH EVERY DAY WITH BREAKFAST  30 tablet  3  . fluticasone (FLONASE) 50 MCG/ACT nasal spray Place 2 sprays into both nostrils daily.  16 g  2  . levETIRAcetam (KEPPRA) 750 MG tablet TAKE 1 TABLET TWICE A DAY  180 tablet  3  . Nutritional Supplements (ESTROVEN  NIGHTTIME PO) Take 1 tablet by mouth at bedtime.      . sodium bicarbonate 325 MG tablet Take 325 mg by mouth 2 (two) times daily.       No current facility-administered medications on file prior to visit.   Allergies  Allergen Reactions  . Codeine Nausea And Vomiting  . Pegasys [Peginterferon Alfa-2a] Hives   History   Social History  . Marital Status: Married    Spouse Name: N/A    Number of Children: 0  . Years of Education: N/A   Occupational History  . Not on file.   Social History Main Topics  . Smoking status: Current Every Day Smoker -- 0.25 packs/day for 40 years    Types: Cigarettes  . Smokeless tobacco: Never Used     Comment: trying to quit smoking.  down to 5 ciggs/ day  . Alcohol Use: No  . Drug Use: No  . Sexual Activity: No   Other Topics Concern  . Not on file   Social History Narrative  . No narrative on file      Review of Systems  All other systems reviewed and are negative.      Objective:   Physical Exam  Vitals reviewed. Constitutional: She appears well-developed and well-nourished.  Cardiovascular:  Normal rate, regular rhythm and normal heart sounds.   No murmur heard. Pulmonary/Chest: Effort normal and breath sounds normal. No respiratory distress. She has no wheezes. She has no rales.  Abdominal: Soft. Bowel sounds are normal. She exhibits no distension. There is no tenderness. There is no rebound and no guarding.  Musculoskeletal: She exhibits no edema.   patient has small purpura on her left arm. She also has hemiparesis in the left arm distal to the elbow which is chronic stemming from her brain tumor         Assessment & Plan:  1. Essential hypertension Blood pressure is well controlled. I will recheck a fasting lipid panel today. Continue her current medications at their present dosages. - COMPLETE METABOLIC PANEL WITH GFR - Lipid panel  2. Hep C w/o coma, chronic Given the recent elevation in her liver function tests, I  will repeat a viral load to check for residual hepatitis C - Hepatitis C RNA quantitative Prescribed patient Ambien 10 mg, one half tablet by mouth each bedtime as needed for insomnia.  I've also recommended complete physical exam for a Pap smear as well as a mammogram. She also needs update on her immunizations including her pneumonia vaccine, shingles vaccine.

## 2014-06-07 LAB — HEPATITIS C RNA QUANTITATIVE
HCV QUANT LOG: 5.77 {Log} — AB (ref <1.18)
HCV Quantitative: 595248 IU/mL — ABNORMAL HIGH (ref <15)

## 2014-06-09 IMAGING — CT CT ABD-PELV W/ CM
1 of 3 series · 14 of 32 positions shown, 19 images · IV contrast (OMNIPAQUE 300)
Comparison: None.

CLINICAL DATA: Diffuse lower quadrant abdominal pain

CT ABDOMEN AND PELVIS WITH CONTRAST
TECHNIQUE: Multidetector CT imaging of the abdomen and pelvis was
performed following the standard protocol during bolus
administration of intravenous contrast.
Contrast: 100 ml 9mnipaque-4PP IV

[Series 2: abd/pel with · axial · 0.74mm/px · z∈[-453,-53]mm · 14 of 92 slices shown, 19 images]
[im 6/92  soft-tissue]
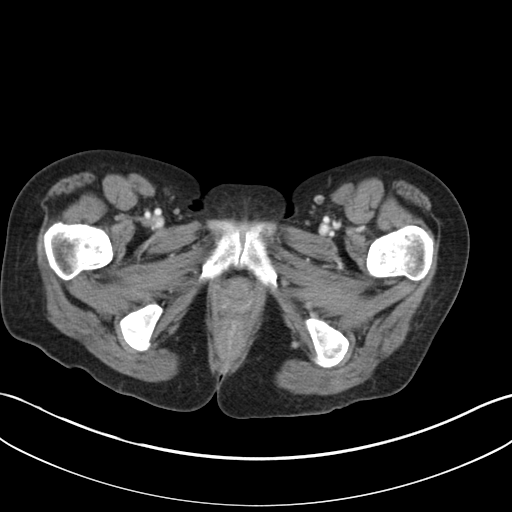
[im 6/92  bone]
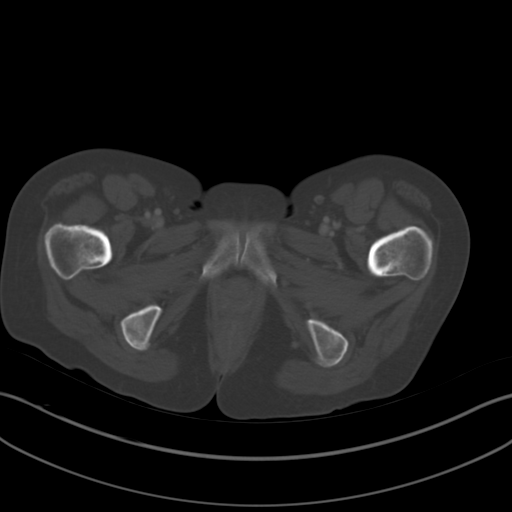
[im 11/92  soft-tissue]
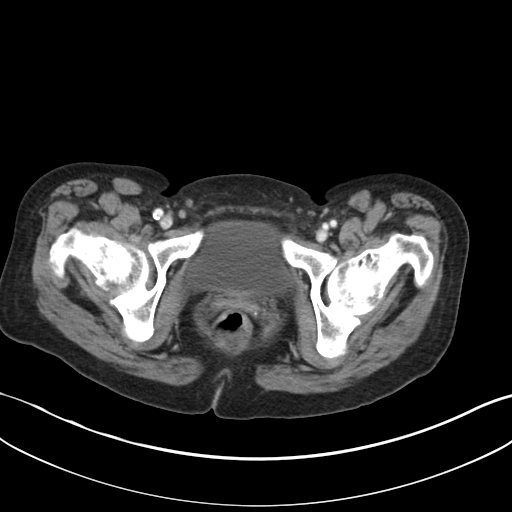
[im 21/92  soft-tissue]
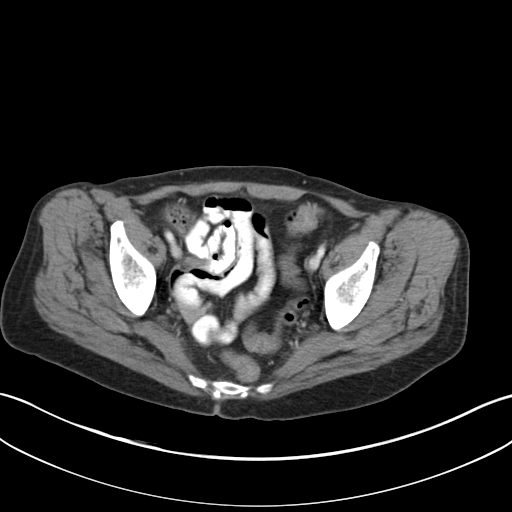
[im 26/92  soft-tissue]
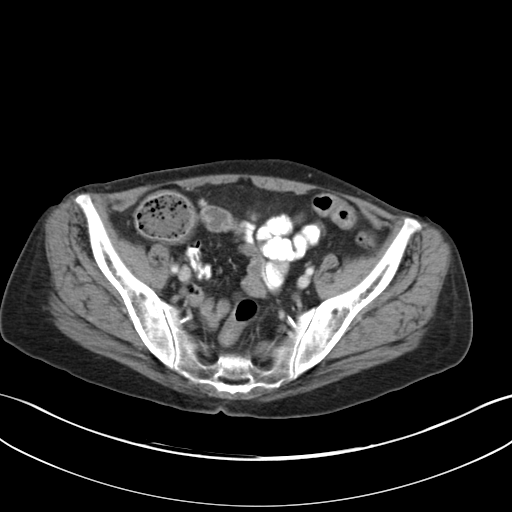
[im 31/92  soft-tissue]
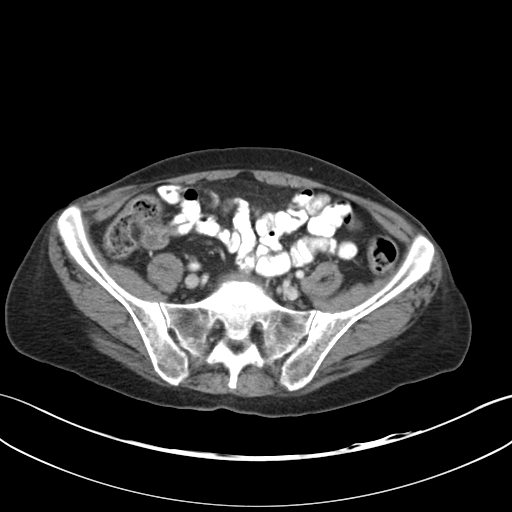
[im 41/92  soft-tissue]
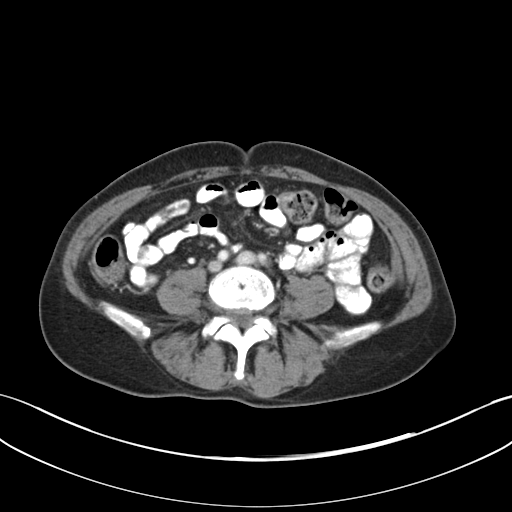
[im 46/92  soft-tissue]
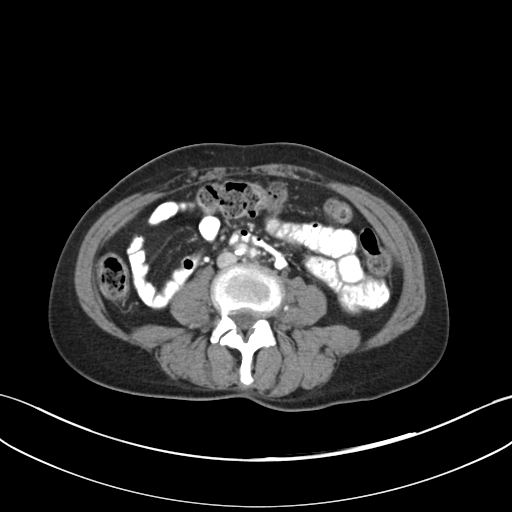
[im 51/92  soft-tissue]
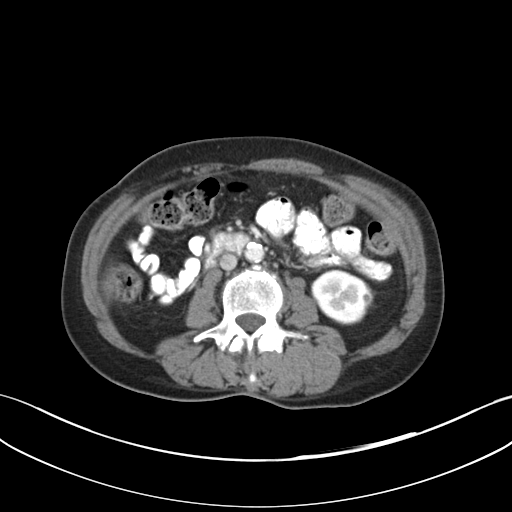
[im 61/92  soft-tissue]
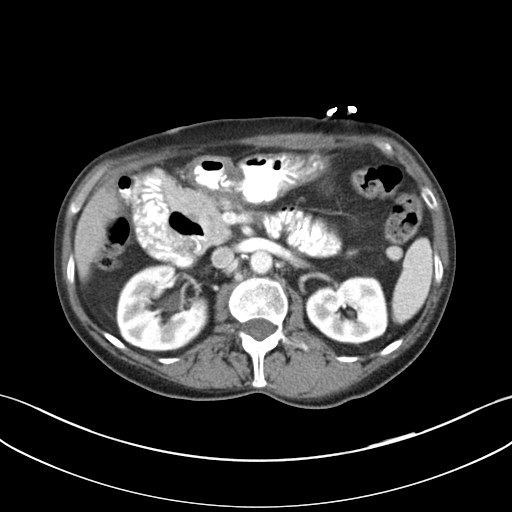
[im 61/92  bone]
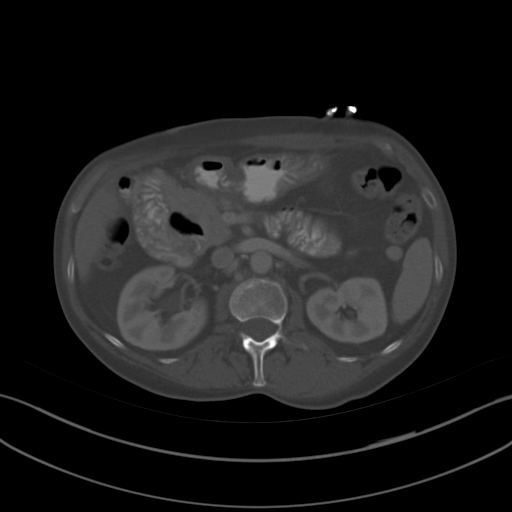
[im 66/92  soft-tissue]
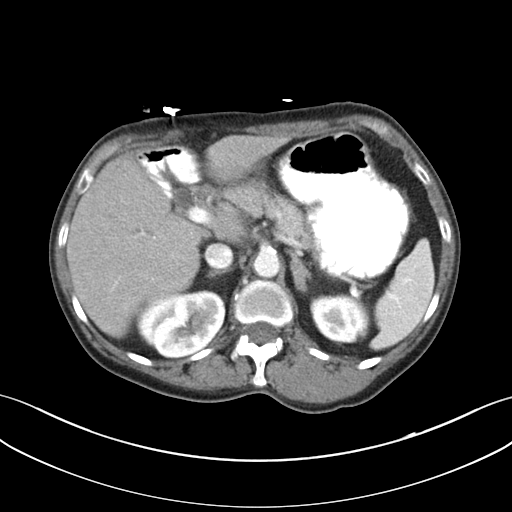
[im 71/92  soft-tissue]
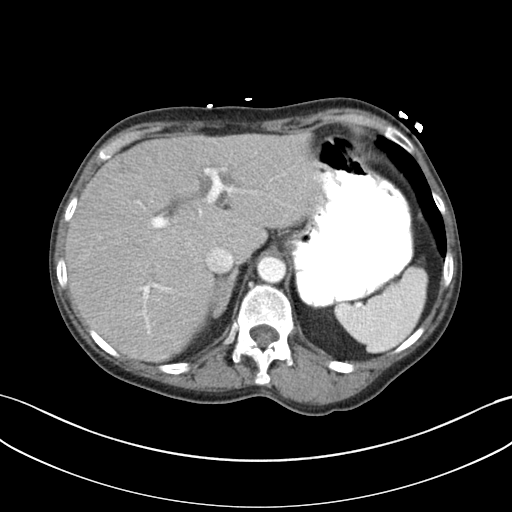
[im 71/92  lung]
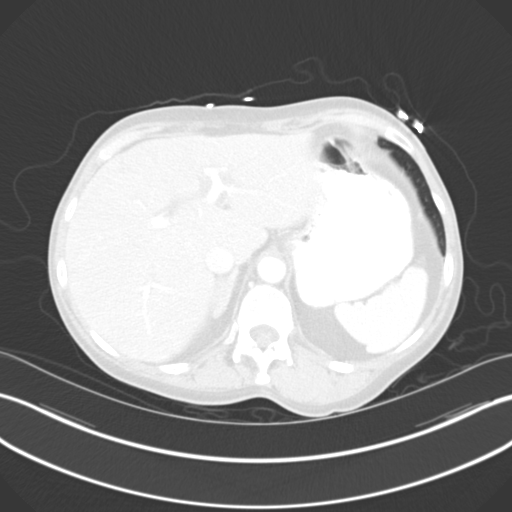
[im 76/92  lung]
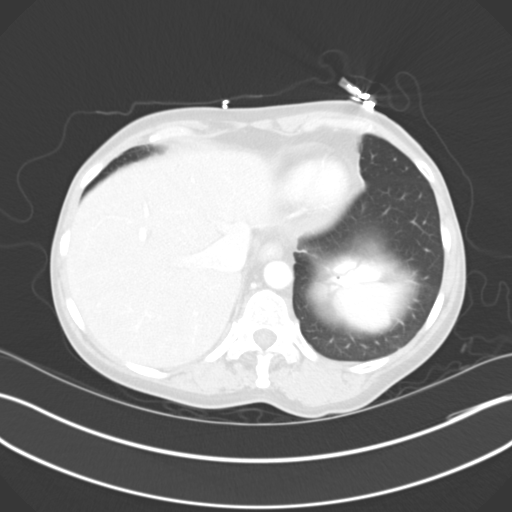
[im 81/92  soft-tissue]
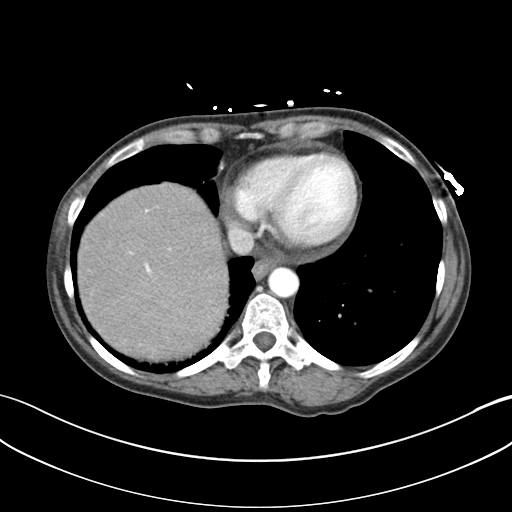
[im 81/92  lung]
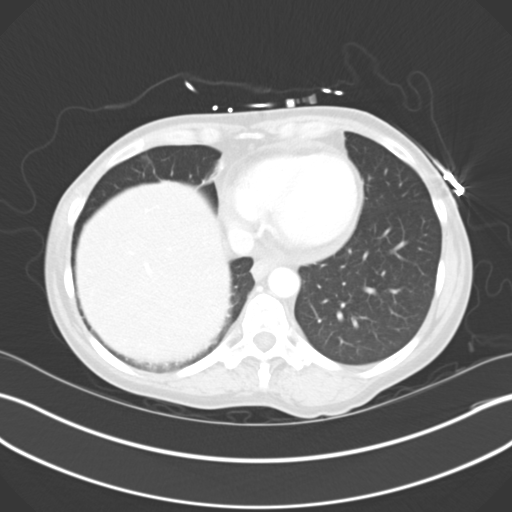
[im 86/92  soft-tissue]
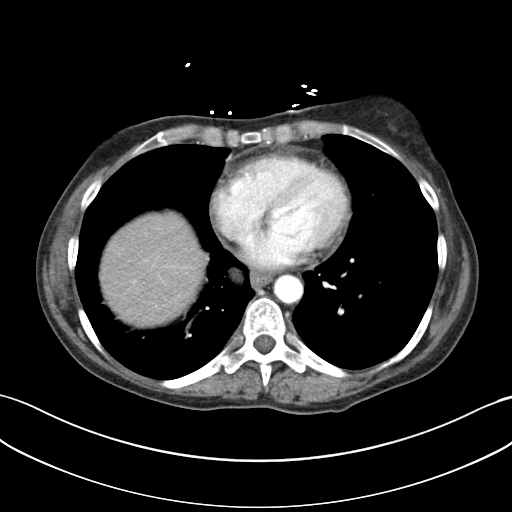
[im 86/92  lung]
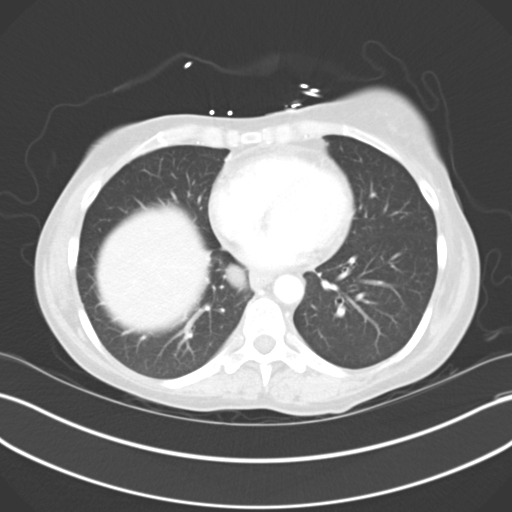

[14 of 32 positions shown; findings below may reference images not displayed]

FINDINGS: Lung bases are clear.

Bilateral breast augmentation.

Liver, spleen, and pancreas are within normal limits.

Mild thickening of the bilateral adrenal glands, likely reflecting
adenomatous hyperplasia.

Status post cholecystectomy. Mild central intrahepatic ductal
prominence.  No extrahepatic ductal dilatation.

Kidneys are within normal limits.  No hydronephrosis.

No evidence of bowel obstruction.  Prior appendectomy.  No colonic
wall thickening or inflammatory changes.

Atherosclerotic calcifications of the abdominal aorta and branch
vessels.

No abdominopelvic ascites.

No suspicious abdominopelvic lymphadenopathy.

Uterus is unremarkable.  No adnexal masses.

Bladder is within normal limits.

Degenerative changes of the lower lumbar spine.
IMPRESSION: No evidence of bowel obstruction.  Prior appendectomy.

No evidence of diverticulitis.

No evidence of abdominal aortic aneurysm.

No CT findings to account for the patient's abdominal pain.

## 2014-06-15 ENCOUNTER — Other Ambulatory Visit: Payer: Self-pay | Admitting: Family Medicine

## 2014-06-15 ENCOUNTER — Encounter: Payer: Self-pay | Admitting: Family Medicine

## 2014-06-15 MED ORDER — ALPRAZOLAM 0.25 MG PO TABS: ORAL_TABLET | ORAL | Status: DC

## 2014-06-15 NOTE — Telephone Encounter (Signed)
Requesting a refill on Xanax stating on a vm received after hours last pm that the ambien was not help. Per WTP ok refill. Med called to pharm and they will contact pt when ready.

## 2014-06-26 ENCOUNTER — Telehealth: Payer: Self-pay | Admitting: Family Medicine

## 2014-06-26 NOTE — Telephone Encounter (Signed)
Patient would like referral to hep c clinic  [phone]

## 2014-06-27 NOTE — Telephone Encounter (Signed)
Pt has been dismissed from practice and on doing E-care for 30 days, would you like for me to refer her to Hep C clinic. Stated that her results came back and needed a referral.

## 2014-06-27 NOTE — Telephone Encounter (Signed)
Send to PCP.

## 2014-06-28 ENCOUNTER — Other Ambulatory Visit: Payer: Self-pay | Admitting: Family Medicine

## 2014-06-28 DIAGNOSIS — B182 Chronic viral hepatitis C: Secondary | ICD-10-CM

## 2014-06-28 NOTE — Telephone Encounter (Signed)
Referral to Hep C clinic initiated

## 2014-06-28 NOTE — Telephone Encounter (Signed)
Yes needs referral.

## 2014-07-02 ENCOUNTER — Other Ambulatory Visit: Payer: Medicare Other | Admitting: Family Medicine

## 2014-07-10 ENCOUNTER — Other Ambulatory Visit: Payer: Self-pay | Admitting: Family Medicine

## 2014-07-11 NOTE — Telephone Encounter (Signed)
Prescription sent to pharmacy.

## 2014-07-18 ENCOUNTER — Telehealth: Payer: Self-pay | Admitting: Family Medicine

## 2014-07-18 NOTE — Telephone Encounter (Signed)
Requesting a refill on Xanax - pt is not due to be dismissed until 07/23/14 - ? OK to Refill

## 2014-07-19 MED ORDER — ALPRAZOLAM 0.25 MG PO TABS: ORAL_TABLET | ORAL | Status: DC

## 2014-07-19 NOTE — Telephone Encounter (Signed)
ok 

## 2014-07-19 NOTE — Telephone Encounter (Signed)
Med called to pharm 

## 2014-08-02 ENCOUNTER — Other Ambulatory Visit: Payer: Medicare Other

## 2014-10-01 ENCOUNTER — Encounter: Payer: Self-pay | Admitting: Family Medicine

## 2014-10-15 ENCOUNTER — Other Ambulatory Visit: Payer: Self-pay | Admitting: Internal Medicine

## 2014-10-15 DIAGNOSIS — M810 Age-related osteoporosis without current pathological fracture: Secondary | ICD-10-CM

## 2014-10-15 DIAGNOSIS — E2839 Other primary ovarian failure: Secondary | ICD-10-CM

## 2014-10-17 ENCOUNTER — Other Ambulatory Visit: Payer: Self-pay | Admitting: Internal Medicine

## 2014-10-17 ENCOUNTER — Other Ambulatory Visit: Payer: Self-pay

## 2014-10-17 DIAGNOSIS — Z1231 Encounter for screening mammogram for malignant neoplasm of breast: Secondary | ICD-10-CM

## 2014-11-13 ENCOUNTER — Ambulatory Visit
Admission: RE | Admit: 2014-11-13 | Discharge: 2014-11-13 | Disposition: A | Discharge status: Home / Self Care | Payer: Medicaid Other | Source: Ambulatory Visit | Attending: Internal Medicine | Admitting: Internal Medicine

## 2014-11-13 ENCOUNTER — Ambulatory Visit
Admission: RE | Admit: 2014-11-13 | Discharge: 2014-11-13 | Disposition: A | Discharge status: Home / Self Care | Payer: Medicaid Other | Source: Ambulatory Visit

## 2014-11-13 DIAGNOSIS — Z1231 Encounter for screening mammogram for malignant neoplasm of breast: Secondary | ICD-10-CM

## 2014-11-13 DIAGNOSIS — M810 Age-related osteoporosis without current pathological fracture: Secondary | ICD-10-CM

## 2014-11-13 DIAGNOSIS — E2839 Other primary ovarian failure: Secondary | ICD-10-CM

## 2014-11-26 ENCOUNTER — Other Ambulatory Visit: Payer: Self-pay | Admitting: Oncology

## 2014-11-26 DIAGNOSIS — B182 Chronic viral hepatitis C: Secondary | ICD-10-CM

## 2014-11-26 DIAGNOSIS — D638 Anemia in other chronic diseases classified elsewhere: Secondary | ICD-10-CM

## 2014-12-07 ENCOUNTER — Encounter: Payer: Self-pay | Admitting: Family Medicine

## 2015-01-29 ENCOUNTER — Other Ambulatory Visit (HOSPITAL_COMMUNITY): Payer: Self-pay | Admitting: Internal Medicine

## 2015-01-29 DIAGNOSIS — B182 Chronic viral hepatitis C: Secondary | ICD-10-CM

## 2015-02-06 ENCOUNTER — Other Ambulatory Visit (HOSPITAL_COMMUNITY): Payer: Medicaid Other

## 2015-02-08 ENCOUNTER — Ambulatory Visit (HOSPITAL_COMMUNITY)
Admission: RE | Admit: 2015-02-08 | Discharge: 2015-02-08 | Disposition: A | Discharge status: Home / Self Care | Payer: Medicare Other | Source: Ambulatory Visit | Attending: Internal Medicine | Admitting: Internal Medicine

## 2015-02-08 DIAGNOSIS — B182 Chronic viral hepatitis C: Secondary | ICD-10-CM

## 2015-04-01 ENCOUNTER — Other Ambulatory Visit: Payer: Medicaid Other

## 2015-04-04 ENCOUNTER — Telehealth: Payer: Self-pay | Admitting: Oncology

## 2015-04-04 NOTE — Telephone Encounter (Signed)
Call to patient to confirm appointment for 04/08/15 at 8:15 lmtcb

## 2015-04-08 ENCOUNTER — Encounter: Payer: Medicaid Other | Admitting: Oncology

## 2015-04-11 ENCOUNTER — Other Ambulatory Visit: Payer: Self-pay | Admitting: Family Medicine

## 2015-04-23 ENCOUNTER — Other Ambulatory Visit (HOSPITAL_COMMUNITY)
Admission: RE | Admit: 2015-04-23 | Discharge: 2015-04-23 | Disposition: A | Discharge status: Home / Self Care | Payer: Medicare Other | Source: Ambulatory Visit | Attending: Internal Medicine | Admitting: Internal Medicine

## 2015-04-23 DIAGNOSIS — B182 Chronic viral hepatitis C: Secondary | ICD-10-CM | POA: Insufficient documentation

## 2015-04-25 LAB — HCV RNA QUANT: HCV Quantitative: NOT DETECTED IU/mL — ABNORMAL LOW (ref <15)

## 2015-11-11 ENCOUNTER — Other Ambulatory Visit: Payer: Self-pay | Admitting: Internal Medicine

## 2015-11-11 DIAGNOSIS — E2839 Other primary ovarian failure: Secondary | ICD-10-CM

## 2016-05-08 ENCOUNTER — Other Ambulatory Visit: Payer: Self-pay | Admitting: Internal Medicine

## 2016-05-08 DIAGNOSIS — Z1231 Encounter for screening mammogram for malignant neoplasm of breast: Secondary | ICD-10-CM

## 2017-05-20 ENCOUNTER — Encounter (HOSPITAL_COMMUNITY): Payer: Self-pay | Admitting: Emergency Medicine

## 2017-05-20 ENCOUNTER — Emergency Department (HOSPITAL_COMMUNITY): Payer: Medicare Other

## 2017-05-20 ENCOUNTER — Emergency Department (HOSPITAL_COMMUNITY)
Admission: EM | Admit: 2017-05-20 | Discharge: 2017-05-20 | Disposition: A | Discharge status: Home / Self Care | Payer: Medicare Other | Attending: Emergency Medicine | Admitting: Emergency Medicine

## 2017-05-20 DIAGNOSIS — I10 Essential (primary) hypertension: Secondary | ICD-10-CM | POA: Insufficient documentation

## 2017-05-20 DIAGNOSIS — N390 Urinary tract infection, site not specified: Secondary | ICD-10-CM

## 2017-05-20 DIAGNOSIS — Z79899 Other long term (current) drug therapy: Secondary | ICD-10-CM | POA: Diagnosis not present

## 2017-05-20 DIAGNOSIS — N3000 Acute cystitis without hematuria: Secondary | ICD-10-CM | POA: Diagnosis not present

## 2017-05-20 DIAGNOSIS — R1031 Right lower quadrant pain: Secondary | ICD-10-CM | POA: Diagnosis present

## 2017-05-20 DIAGNOSIS — F17210 Nicotine dependence, cigarettes, uncomplicated: Secondary | ICD-10-CM | POA: Insufficient documentation

## 2017-05-20 LAB — COMPREHENSIVE METABOLIC PANEL
ALT: 15 U/L (ref 14–54)
AST: 20 U/L (ref 15–41)
Albumin: 4.8 g/dL (ref 3.5–5.0)
Alkaline Phosphatase: 59 U/L (ref 38–126)
Anion gap: 11 (ref 5–15)
BUN: 13 mg/dL (ref 6–20)
CHLORIDE: 97 mmol/L — AB (ref 101–111)
CO2: 26 mmol/L (ref 22–32)
CREATININE: 1.01 mg/dL — AB (ref 0.44–1.00)
Calcium: 9.4 mg/dL (ref 8.9–10.3)
GFR, EST NON AFRICAN AMERICAN: 58 mL/min — AB (ref >60)
Glucose, Bld: 106 mg/dL — ABNORMAL HIGH (ref 65–99)
POTASSIUM: 3.5 mmol/L (ref 3.5–5.1)
Sodium: 134 mmol/L — ABNORMAL LOW (ref 135–145)
Total Bilirubin: 0.4 mg/dL (ref 0.3–1.2)
Total Protein: 7.8 g/dL (ref 6.5–8.1)

## 2017-05-20 LAB — CBC WITH DIFFERENTIAL/PLATELET
Basophils Absolute: 0.1 10*3/uL (ref 0.0–0.1)
Basophils Relative: 1 %
EOS PCT: 1 %
Eosinophils Absolute: 0.1 10*3/uL (ref 0.0–0.7)
HCT: 52.4 % — ABNORMAL HIGH (ref 36.0–46.0)
Hemoglobin: 18.3 g/dL — ABNORMAL HIGH (ref 12.0–15.0)
LYMPHS ABS: 2.3 10*3/uL (ref 0.7–4.0)
LYMPHS PCT: 27 %
MCH: 32.2 pg (ref 26.0–34.0)
MCHC: 34.9 g/dL (ref 30.0–36.0)
MCV: 92.1 fL (ref 78.0–100.0)
MONO ABS: 0.6 10*3/uL (ref 0.1–1.0)
Monocytes Relative: 7 %
Neutro Abs: 5.5 10*3/uL (ref 1.7–7.7)
Neutrophils Relative %: 64 %
Platelets: 232 10*3/uL (ref 150–400)
RBC: 5.69 MIL/uL — ABNORMAL HIGH (ref 3.87–5.11)
RDW: 13.3 % (ref 11.5–15.5)
WBC: 8.7 10*3/uL (ref 4.0–10.5)

## 2017-05-20 LAB — URINALYSIS, ROUTINE W REFLEX MICROSCOPIC
BILIRUBIN URINE: NEGATIVE
Glucose, UA: NEGATIVE mg/dL
Ketones, ur: NEGATIVE mg/dL
Nitrite: NEGATIVE
Protein, ur: NEGATIVE mg/dL
Specific Gravity, Urine: 1.009 (ref 1.005–1.030)
pH: 6 (ref 5.0–8.0)

## 2017-05-20 LAB — LIPASE, BLOOD: Lipase: 34 U/L (ref 11–51)

## 2017-05-20 MED ORDER — SODIUM CHLORIDE 0.9 % IV BOLUS (SEPSIS)
1000.0000 mL | Freq: Once | INTRAVENOUS | Status: AC
  Administered 2017-05-20: 1000 mL via INTRAVENOUS

## 2017-05-20 MED ORDER — CEPHALEXIN 500 MG PO CAPS: 500.0000 mg | ORAL_CAPSULE | Freq: Four times a day (QID) | ORAL | 0 refills | Status: DC

## 2017-05-20 MED ORDER — DEXTROSE 5 % IV SOLN
1.0000 g | Freq: Once | INTRAVENOUS | Status: AC
  Administered 2017-05-20: 1 g via INTRAVENOUS
  Filled 2017-05-20: qty 10

## 2017-05-20 MED ORDER — IOPAMIDOL (ISOVUE-300) INJECTION 61%
75.0000 mL | Freq: Once | INTRAVENOUS | Status: AC | PRN
  Administered 2017-05-20: 75 mL via INTRAVENOUS

## 2017-05-20 NOTE — ED Notes (Signed)
Pt returned from CT °

## 2017-05-20 NOTE — Discharge Instructions (Signed)
You have a urinary tract infection. Increase fluids. Prescription for antibiotic. Return for fever, chills, worsening pain.

## 2017-05-20 NOTE — ED Notes (Signed)
Instructed pt to take all of antibiotics as prescribed. 

## 2017-05-20 NOTE — ED Triage Notes (Signed)
Pt reports right lower quad pain for the past 3-5 days with chills.  Denies vomiting or diarrhea.

## 2017-05-20 NOTE — ED Provider Notes (Signed)
[facility] DEPT Provider Note   CSN: [id number] Arrival date & time: 05/20/17  9937     History   Chief Complaint Chief Complaint  Patient presents with  . Abdominal Pain    HPI GRACIN SOOHOO is a 66 y.o. female.  Right lower quadrant pain for 3-4 days with associated chills. No fever, sweats, nausea, vomiting, diarrhea, hematuria, dysuria, chest pain, dyspnea. She is status post appendectomy and cholecystectomy. She drinks alcohol and smokes cigarettes. Poor appetite recently. Severity of symptoms is mild to moderate. Palpation makes pain worse      Past Medical History:  Diagnosis Date  . Allergic rhinitis   . Anemia   . Blood transfusion without reported diagnosis   . Ectopic pregnancy   . GERD (gastroesophageal reflux disease)   . Hepatitis C   . Hypertension   . Seizures [facility])     Patient Active Problem List   Diagnosis Date Noted  . Anemia 05/08/2013  . Unspecified constipation 05/08/2013  . SIADH (syndrome of inappropriate ADH production) ([place]) 05/08/2013  . Syncope and collapse 03/20/2013  . Hyponatremia 03/20/2013  . Hypokalemia 03/20/2013  . Hep C w/o coma, chronic ([place]) 03/08/2013  . Hypertension     Past Surgical History:  Procedure Laterality Date  . APPENDECTOMY    . BRAIN SURGERY     x 3  . BREAST ENHANCEMENT SURGERY    . CHOLECYSTECTOMY    . ECTOPIC PREGNANCY SURGERY    . LUMBAR SPINE SURGERY    . TONSILLECTOMY AND ADENOIDECTOMY      OB History    Gravida Para Term Preterm AB Living   1       1     SAB TAB Ectopic Multiple Live Births       1           Home Medications    Prior to Admission medications   Medication Sig Start Date End Date Taking? Authorizing Provider  ALPRAZolam (XANAX) 0.25 MG tablet TAKE 1 TABLET DAILY AS NEEDED FOR SLEEP. 07/19/14  Yes Susy Frizzle, MD  AZOR 5-20 MG per tablet TAKE 1 TABLET BY MOUTH EVERY DAY 04/11/15  Yes Susy Frizzle, MD  ferrous sulfate 325 (65 FE) MG tablet TAKE 1  TABLET BY MOUTH EVERY DAY WITH BREAKFAST 03/23/14  Yes Susy Frizzle, MD  levETIRAcetam (KEPPRA) 750 MG tablet TAKE 1 TABLET TWICE A DAY 04/11/14  Yes Susy Frizzle, MD  levothyroxine (SYNTHROID, LEVOTHROID) 50 MCG tablet Take 50 mcg by mouth daily before breakfast.   Yes [provider]  sodium bicarbonate 325 MG tablet Take 650 mg by mouth 2 (two) times daily.    Yes [provider]  cephALEXin (KEFLEX) 500 MG capsule Take 1 capsule (500 mg total) by mouth 4 (four) times daily. 05/20/17   Nat Christen, MD    Family History Family History  Problem Relation Age of Onset  . Heart failure Mother   . Lung cancer Father   . Alcohol abuse Brother   . Leukemia Brother   . Colon cancer Neg Hx     Social History Social History  Substance Use Topics  . Smoking status: Current Every Day Smoker    Packs/day: 0.25    Years: 40.00    Types: Cigarettes  . Smokeless tobacco: Never Used  . Alcohol use No     Allergies   Codeine and Pegasys [peginterferon alfa-2a]   Review of Systems Review of Systems  All other systems  reviewed and are negative.    Physical Exam Updated Vital Signs BP (!) 154/83 (BP Location: Left Arm)   Pulse 89   Temp 98.1 F (36.7 C) (Oral)   Resp 18   Ht 5\' 5"  (1.651 m)   Wt 52.2 kg (115 lb)   SpO2 100%   BMI 19.14 kg/m   Physical Exam  Constitutional: She is oriented to person, place, and time. She appears well-developed and well-nourished.  HENT:  Head: Normocephalic and atraumatic.  Eyes: Conjunctivae are normal.  Neck: Neck supple.  Cardiovascular: Normal rate and regular rhythm.   Pulmonary/Chest: Effort normal and breath sounds normal.  Abdominal:  Minimal tenderness right lower quadrant; no acute abdomen.  Musculoskeletal: Normal range of motion.  Neurological: She is alert and oriented to person, place, and time.  Skin: Skin is warm and dry.  Psychiatric: She has a normal mood and affect. Her behavior is normal.    Nursing note and vitals reviewed.    ED Treatments / Results  Labs (all labs ordered are listed, but only abnormal results are displayed) Labs Reviewed  CBC WITH DIFFERENTIAL/PLATELET - Abnormal; Notable for the following:       Result Value   RBC 5.69 (*)    Hemoglobin 18.3 (*)    HCT 52.4 (*)    All other components within normal limits  COMPREHENSIVE METABOLIC PANEL - Abnormal; Notable for the following:    Sodium 134 (*)    Chloride 97 (*)    Glucose, Bld 106 (*)    Creatinine, Ser 1.01 (*)    GFR calc non Af Amer 58 (*)    All other components within normal limits  URINALYSIS, ROUTINE W REFLEX MICROSCOPIC - Abnormal; Notable for the following:    APPearance HAZY (*)    Hgb urine dipstick MODERATE (*)    Leukocytes, UA LARGE (*)    Bacteria, UA FEW (*)    Squamous Epithelial / LPF 0-5 (*)    All other components within normal limits  URINE CULTURE  LIPASE, BLOOD    EKG  EKG Interpretation None       Radiology Ct Abdomen Pelvis W Contrast  Result Date: 05/20/2017 CLINICAL DATA:  Right lower quadrant pain for 3-5 days with chills. EXAM: CT ABDOMEN AND PELVIS WITH CONTRAST TECHNIQUE: Multidetector CT imaging of the abdomen and pelvis was performed using the standard protocol following bolus administration of intravenous contrast. CONTRAST:  75 ml ISOVUE-300 IOPAMIDOL (ISOVUE-300) INJECTION 61% COMPARISON:  CT abdomen and pelvis 03/19/2013. FINDINGS: Lower chest: The lung bases demonstrate emphysematous disease. No pleural or pericardial effusion. Calcific coronary and aortic atherosclerosis is identified. Densely calcified breast implants are partially visualized. Hepatobiliary: Status post cholecystectomy. Minimal intrahepatic biliary ductal prominence is unchanged and likely related to age and gallbladder removal. No focal liver lesion. Pancreas: A 0.4 cm in diameter hypoattenuating lesion in the body of pancreas on image 24 is unchanged likely due to prior inflammatory  process. Small calcification the pancreas is identified. No pancreatic ductal dilatation, surrounding inflammatory change or mass. Spleen: Normal in size without focal abnormality. Adrenals/Urinary Tract: Mild thickening of the adrenal glands compatible with hyperplasia is unchanged. 2 tiny hypoattenuating lesions in the left kidney are consistent with cysts and unchanged. No hydronephrosis or urinary tract stones. Urinary bladder appears normal. Stomach/Bowel: The appendix has been removed. A few sigmoid diverticula are seen without diverticulitis. The colon is otherwise normal in appearance. The stomach and small bowel appear normal. Vascular/Lymphatic: Extensive aortoiliac atherosclerosis without aneurysm  is present. No lymphadenopathy. Reproductive: Uterus and bilateral adnexa are unremarkable. Other: No fluid collection or hernia. Musculoskeletal: Advanced osteoarthritis right hip is seen where there is bone-on-bone joint space narrowing and osteophytosis. Disc bulging in the lumbar spine is most notable at L2-3 and L4-5. No worrisome bony lesion is identified. IMPRESSION: No acute abnormality or finding to explain the patient's symptoms. Lung bases appear emphysematous. Extensive calcific atherosclerosis. Sigmoid diverticulosis without diverticulitis. Status post cholecystectomy and appendectomy. Advanced right hip osteoarthritis. Degenerative disc disease lumbar spine most notable at L2-3 and L4-5 also seen. Electronically Signed   By: Inge Rise M.D.   On: 05/20/2017 12:29    Procedures Procedures (including critical care time)  Medications Ordered in ED Medications  cefTRIAXone (ROCEPHIN) 1 g in dextrose 5 % 50 mL IVPB (1 g Intravenous New Bag/Given 05/20/17 1300)  sodium chloride 0.9 % bolus 1,000 mL (1,000 mLs Intravenous New Bag/Given 05/20/17 1124)  iopamidol (ISOVUE-300) 61 % injection 75 mL (75 mLs Intravenous Contrast Given 05/20/17 1201)     Initial Impression / Assessment and Plan  / ED Course  I have reviewed the triage vital signs and the nursing notes.  Pertinent labs & imaging results that were available during my care of the patient were reviewed by me and considered in my medical decision making (see chart for details).     Patient is in no acute distress. No acute abdomen. White count normal. CT abdomen/pelvis shows no acute findings. Urinalysis reveals evidence of infection. IV Rocephin. Discharge medications Keflex 500 mg.  This was discussed with the patient and her significant other  Final Clinical Impressions(s) / ED Diagnoses   Final diagnoses:  Urinary tract infection without hematuria, site unspecified    New Prescriptions New Prescriptions   CEPHALEXIN (KEFLEX) 500 MG CAPSULE    Take 1 capsule (500 mg total) by mouth 4 (four) times daily.     Nat Christen, MD 05/20/17 1318

## 2017-05-20 NOTE — ED Notes (Signed)
D/c'd IV to right AC, catheter intact and site wnl, bandaid applied

## 2017-05-23 LAB — URINE CULTURE

## 2017-05-24 ENCOUNTER — Telehealth: Payer: Self-pay | Admitting: Emergency Medicine

## 2017-05-24 NOTE — Telephone Encounter (Signed)
Post ED Visit - Positive Culture Follow-up  Culture report reviewed by antimicrobial stewardship pharmacist:  []  Elenor Quinones, Pharm.D. []  Heide Guile, Pharm.D., BCPS AQ-ID []  Parks Neptune, Pharm.D., BCPS []  Alycia Rossetti, Pharm.D., BCPS []  [place], [place].D., BCPS, AAHIVP [x]  Legrand Como, Pharm.D., BCPS, AAHIVP []  Salome Arnt, PharmD, BCPS []  Dimitri Ped, PharmD, BCPS []  Vincenza Hews, PharmD, BCPS  Positive urine culture Treated with cephalexin, organism sensitive to the same and no further patient follow-up is required at this time.  Hazle Nordmann 05/24/2017, 2:23 PM

## 2018-02-27 ENCOUNTER — Encounter (HOSPITAL_COMMUNITY): Payer: Self-pay | Admitting: Emergency Medicine

## 2018-02-27 ENCOUNTER — Other Ambulatory Visit: Payer: Self-pay

## 2018-02-27 ENCOUNTER — Emergency Department (HOSPITAL_COMMUNITY)
Admission: EM | Admit: 2018-02-27 | Discharge: 2018-02-27 | Disposition: A | Discharge status: Home / Self Care | Payer: Medicare Other | Attending: Emergency Medicine | Admitting: Emergency Medicine

## 2018-02-27 DIAGNOSIS — R0981 Nasal congestion: Secondary | ICD-10-CM | POA: Diagnosis present

## 2018-02-27 DIAGNOSIS — I10 Essential (primary) hypertension: Secondary | ICD-10-CM | POA: Diagnosis not present

## 2018-02-27 DIAGNOSIS — F17210 Nicotine dependence, cigarettes, uncomplicated: Secondary | ICD-10-CM | POA: Diagnosis not present

## 2018-02-27 DIAGNOSIS — J069 Acute upper respiratory infection, unspecified: Secondary | ICD-10-CM | POA: Insufficient documentation

## 2018-02-27 MED ORDER — MOMETASONE FUROATE 50 MCG/ACT NA SUSP: 2.0000 | Freq: Every day | NASAL | 0 refills | Status: DC

## 2018-02-27 MED ORDER — PREDNISONE 20 MG PO TABS: 40.0000 mg | ORAL_TABLET | Freq: Every day | ORAL | 0 refills | Status: DC

## 2018-02-27 NOTE — Discharge Instructions (Addendum)
Stop the over-the-counter cold and sinus medication.  You may take Tylenol every 4 hours if needed for body aches or fever.  Drink plenty of fluids.  Follow-up with your primary doctor for recheck or return to ER for any worsening symptoms.

## 2018-02-27 NOTE — ED Triage Notes (Signed)
Patient c/o sinus pressure, chills, nasal congestion, headache and body aches that started Saturday. Denies any ear pain, fever, cough, or sore throat. Patient also c/o diarrhea that started this morning. Patient states took laxative last night. (6) loose stools this morning. Denies any nausea or vomiting.

## 2018-02-27 NOTE — ED Provider Notes (Signed)
[facility] EMERGENCY DEPARTMENT Provider Note   CSN: [id number] Arrival date & time: 02/27/18  1129     History   Chief Complaint Chief Complaint  Patient presents with  . Nasal Congestion    HPI Catherine Gonzalez is a 67 y.o. female.  HPI  Catherine Gonzalez is a 67 y.o. female who presents to the Emergency Department complaining of sinus pressure, nasal congestion, frontal headache and generalized body aches and chills.  Symptoms have been present for 1 week.  She states that she has been taking over-the-counter cold and sinus medication without relief.  She also complains of excessive diarrhea this morning, but states that she took a laxative last evening.  She denies fever, abdominal pain, vomiting, or nausea.  She also denies ear pain, sore throat, cough and shortness of breath.   Past Medical History:  Diagnosis Date  . Allergic rhinitis   . Anemia   . Blood transfusion without reported diagnosis   . Ectopic pregnancy   . GERD (gastroesophageal reflux disease)   . Hepatitis C   . Hypertension   . Seizures [facility])     Patient Active Problem List   Diagnosis Date Noted  . Anemia 05/08/2013  . Unspecified constipation 05/08/2013  . SIADH (syndrome of inappropriate ADH production) ([place]) 05/08/2013  . Syncope and collapse 03/20/2013  . Hyponatremia 03/20/2013  . Hypokalemia 03/20/2013  . Hep C w/o coma, chronic ([place]) 03/08/2013  . Hypertension     Past Surgical History:  Procedure Laterality Date  . APPENDECTOMY    . BRAIN SURGERY     x 3  . BREAST ENHANCEMENT SURGERY    . CHOLECYSTECTOMY    . ECTOPIC PREGNANCY SURGERY    . LUMBAR SPINE SURGERY    . TONSILLECTOMY AND ADENOIDECTOMY       OB History    Gravida  1   Para      Term      Preterm      AB  1   Living        SAB      TAB      Ectopic  1   Multiple      Live Births               Home Medications    Prior to Admission medications   Medication Sig Start Date End  Date Taking? Authorizing Provider  ALPRAZolam Duanne Moron) 0.25 MG tablet TAKE 1 TABLET DAILY AS NEEDED FOR SLEEP. 07/19/14   Susy Frizzle, MD  AZOR 5-20 MG per tablet TAKE 1 TABLET BY MOUTH EVERY DAY 04/11/15   Susy Frizzle, MD  cephALEXin (KEFLEX) 500 MG capsule Take 1 capsule (500 mg total) by mouth 4 (four) times daily. 05/20/17   Nat Christen, MD  ferrous sulfate 325 (65 FE) MG tablet TAKE 1 TABLET BY MOUTH EVERY DAY WITH BREAKFAST 03/23/14   Susy Frizzle, MD  levETIRAcetam (KEPPRA) 750 MG tablet TAKE 1 TABLET TWICE A DAY 04/11/14   Susy Frizzle, MD  levothyroxine (SYNTHROID, LEVOTHROID) 50 MCG tablet Take 50 mcg by mouth daily before breakfast.    [provider]  sodium bicarbonate 325 MG tablet Take 650 mg by mouth 2 (two) times daily.     [provider]    Family History Family History  Problem Relation Age of Onset  . Heart failure Mother   . Lung cancer Father   . Alcohol abuse Brother   . Leukemia Brother   .  Colon cancer Neg Hx     Social History Social History   Tobacco Use  . Smoking status: Current Every Day Smoker    Packs/day: 0.25    Years: 40.00    Pack years: 10.00    Types: Cigarettes  . Smokeless tobacco: Never Used  Substance Use Topics  . Alcohol use: No  . Drug use: No     Allergies   Codeine and Pegasys [[id number] [id number]]   Review of Systems Review of Systems  Constitutional: Positive for chills. Negative for activity change, appetite change and fever.  HENT: Positive for congestion, sinus pressure and sinus pain. Negative for facial swelling, rhinorrhea, sore throat and trouble swallowing.   Eyes: Negative for visual disturbance.  Respiratory: Negative for cough, chest tightness, shortness of breath, wheezing and stridor.   Cardiovascular: Negative for chest pain.  Gastrointestinal: Positive for diarrhea. Negative for abdominal pain, nausea and vomiting.  Musculoskeletal: Positive for myalgias. Negative for  neck pain and neck stiffness.  Skin: Negative for rash.  Neurological: Negative for dizziness, weakness, numbness and headaches.  Hematological: Negative for adenopathy.  Psychiatric/Behavioral: Negative for confusion.  All other systems reviewed and are negative.    Physical Exam Updated Vital Signs BP (!) 147/101 (BP Location: Right Arm)   Temp 98.3 F (36.8 C) (Oral)   Resp 17   Ht 5\' 4"  (1.626 m)   Wt 50.3 kg (111 lb)   SpO2 100%   BMI 19.05 kg/m   Physical Exam  Constitutional: She is oriented to person, place, and time. She appears well-developed and well-nourished. No distress.  HENT:  Head: Normocephalic and atraumatic.  Right Ear: Tympanic membrane and ear canal normal.  Left Ear: Tympanic membrane and ear canal normal.  Nose: Mucosal edema and rhinorrhea present. Right sinus exhibits frontal sinus tenderness. Left sinus exhibits frontal sinus tenderness.  Mouth/Throat: Uvula is midline and mucous membranes are normal. No trismus in the jaw. No uvula swelling. Posterior oropharyngeal erythema present. No oropharyngeal exudate, posterior oropharyngeal edema or tonsillar abscesses.  Eyes: Conjunctivae are normal.  Neck: Normal range of motion and phonation normal. Neck supple. No Brudzinski's sign and no Kernig's sign noted.  Cardiovascular: Normal rate, regular rhythm and intact distal pulses.  No murmur heard. Pulmonary/Chest: Effort normal and breath sounds normal. No respiratory distress. She has no wheezes. She has no rales.  Abdominal: Soft. She exhibits no distension and no mass. There is no tenderness. There is no rebound and no guarding.  Musculoskeletal: Normal range of motion. She exhibits no edema.  Lymphadenopathy:    She has no cervical adenopathy.  Neurological: She is alert and oriented to person, place, and time. She exhibits normal muscle tone. Coordination normal.  Skin: Skin is warm and dry.  Psychiatric: She has a normal mood and affect.  Nursing  note and vitals reviewed.    ED Treatments / Results  Labs (all labs ordered are listed, but only abnormal results are displayed) Labs Reviewed - No data to display  EKG None  Radiology No results found.  Procedures Procedures (including critical care time)  Medications Ordered in ED Medications - No data to display   Initial Impression / Assessment and Plan / ED Course  I have reviewed the triage vital signs and the nursing notes.  Pertinent labs & imaging results that were available during my care of the patient were reviewed by me and considered in my medical decision making (see chart for details).     Patient well-appearing.  Nontoxic.  Slightly hypertensive but patient admits to taking her daily antihypertensives but has been taking decongestants which I feel is probably elevated her blood pressure.  I have counseled her to discontinue over-the-counter cold medications.  Symptoms likely viral.  She agrees to treatment plan and PCP follow-up if needed.  Final Clinical Impressions(s) / ED Diagnoses   Final diagnoses:  Upper respiratory tract infection, unspecified type    ED Discharge Orders    None       Kem Parkinson, PA-C 02/27/18 [place], [place], DO 02/27/18 1503

## 2018-04-04 ENCOUNTER — Encounter: Payer: Self-pay | Admitting: Gastroenterology

## 2020-05-18 ENCOUNTER — Encounter (HOSPITAL_COMMUNITY): Payer: Self-pay | Admitting: Emergency Medicine

## 2020-05-18 ENCOUNTER — Emergency Department (HOSPITAL_COMMUNITY): Payer: Medicare Other

## 2020-05-18 ENCOUNTER — Emergency Department (HOSPITAL_COMMUNITY)
Admission: EM | Admit: 2020-05-18 | Discharge: 2020-05-18 | Disposition: A | Discharge status: Home / Self Care | Payer: Medicare Other | Attending: Emergency Medicine | Admitting: Emergency Medicine

## 2020-05-18 ENCOUNTER — Other Ambulatory Visit: Payer: Self-pay

## 2020-05-18 DIAGNOSIS — R197 Diarrhea, unspecified: Secondary | ICD-10-CM

## 2020-05-18 DIAGNOSIS — R519 Headache, unspecified: Secondary | ICD-10-CM

## 2020-05-18 DIAGNOSIS — Z79899 Other long term (current) drug therapy: Secondary | ICD-10-CM | POA: Insufficient documentation

## 2020-05-18 DIAGNOSIS — I10 Essential (primary) hypertension: Secondary | ICD-10-CM | POA: Insufficient documentation

## 2020-05-18 DIAGNOSIS — F17210 Nicotine dependence, cigarettes, uncomplicated: Secondary | ICD-10-CM | POA: Insufficient documentation

## 2020-05-18 DIAGNOSIS — K529 Noninfective gastroenteritis and colitis, unspecified: Secondary | ICD-10-CM | POA: Diagnosis not present

## 2020-05-18 DIAGNOSIS — G44319 Acute post-traumatic headache, not intractable: Secondary | ICD-10-CM | POA: Diagnosis not present

## 2020-05-18 DIAGNOSIS — R112 Nausea with vomiting, unspecified: Secondary | ICD-10-CM | POA: Diagnosis present

## 2020-05-18 DIAGNOSIS — R0981 Nasal congestion: Secondary | ICD-10-CM | POA: Diagnosis not present

## 2020-05-18 DIAGNOSIS — Z20822 Contact with and (suspected) exposure to covid-19: Secondary | ICD-10-CM | POA: Insufficient documentation

## 2020-05-18 LAB — CBC WITH DIFFERENTIAL/PLATELET
Abs Immature Granulocytes: 0.05 10*3/uL (ref 0.00–0.07)
Basophils Absolute: 0.1 10*3/uL (ref 0.0–0.1)
Basophils Relative: 0 %
Eosinophils Absolute: 0.1 10*3/uL (ref 0.0–0.5)
Eosinophils Relative: 1 %
HCT: 39.7 % (ref 36.0–46.0)
Hemoglobin: 13.1 g/dL (ref 12.0–15.0)
Immature Granulocytes: 0 %
Lymphocytes Relative: 24 %
Lymphs Abs: 3 10*3/uL (ref 0.7–4.0)
MCH: 30.7 pg (ref 26.0–34.0)
MCHC: 33 g/dL (ref 30.0–36.0)
MCV: 93 fL (ref 80.0–100.0)
Monocytes Absolute: 1.1 10*3/uL — ABNORMAL HIGH (ref 0.1–1.0)
Monocytes Relative: 9 %
Neutro Abs: 8.2 10*3/uL — ABNORMAL HIGH (ref 1.7–7.7)
Neutrophils Relative %: 66 %
Platelets: 311 10*3/uL (ref 150–400)
RBC: 4.27 MIL/uL (ref 3.87–5.11)
RDW: 12.4 % (ref 11.5–15.5)
WBC: 12.5 10*3/uL — ABNORMAL HIGH (ref 4.0–10.5)
nRBC: 0 % (ref 0.0–0.2)

## 2020-05-18 LAB — URINALYSIS, ROUTINE W REFLEX MICROSCOPIC
Bilirubin Urine: NEGATIVE
Glucose, UA: NEGATIVE mg/dL
Ketones, ur: 5 mg/dL — AB
Leukocytes,Ua: NEGATIVE
Nitrite: NEGATIVE
Protein, ur: NEGATIVE mg/dL
Specific Gravity, Urine: 1.042 — ABNORMAL HIGH (ref 1.005–1.030)
pH: 6 (ref 5.0–8.0)

## 2020-05-18 LAB — SARS CORONAVIRUS 2 BY RT PCR (HOSPITAL ORDER, PERFORMED IN ~~LOC~~ HOSPITAL LAB): SARS Coronavirus 2: NEGATIVE

## 2020-05-18 LAB — COMPREHENSIVE METABOLIC PANEL
ALT: 20 U/L (ref 0–44)
AST: 22 U/L (ref 15–41)
Albumin: 4.7 g/dL (ref 3.5–5.0)
Alkaline Phosphatase: 67 U/L (ref 38–126)
Anion gap: 9 (ref 5–15)
BUN: 17 mg/dL (ref 8–23)
CO2: 28 mmol/L (ref 22–32)
Calcium: 9.2 mg/dL (ref 8.9–10.3)
Chloride: 100 mmol/L (ref 98–111)
Creatinine, Ser: 0.67 mg/dL (ref 0.44–1.00)
GFR calc Af Amer: >60 mL/min (ref >60)
GFR calc non Af Amer: >60 mL/min (ref >60)
Glucose, Bld: 105 mg/dL — ABNORMAL HIGH (ref 70–99)
Potassium: 3.4 mmol/L — ABNORMAL LOW (ref 3.5–5.1)
Sodium: 137 mmol/L (ref 135–145)
Total Bilirubin: 0.6 mg/dL (ref 0.3–1.2)
Total Protein: 7.5 g/dL (ref 6.5–8.1)

## 2020-05-18 LAB — LIPASE, BLOOD: Lipase: 33 U/L (ref 11–51)

## 2020-05-18 MED ORDER — LORATADINE 10 MG PO TABS
10.0000 mg | ORAL_TABLET | Freq: Every day | ORAL | 0 refills | Status: DC
Start: 2020-05-18 — End: 2020-07-22

## 2020-05-18 MED ORDER — IOHEXOL 300 MG/ML  SOLN
100.0000 mL | Freq: Once | INTRAMUSCULAR | Status: AC | PRN
  Administered 2020-05-18: 75 mL via INTRAVENOUS

## 2020-05-18 MED ORDER — ONDANSETRON HCL 4 MG/2ML IJ SOLN: 4.0000 mg | Freq: Once | INTRAMUSCULAR | Status: DC

## 2020-05-18 MED ORDER — KETOROLAC TROMETHAMINE 30 MG/ML IJ SOLN
30.0000 mg | Freq: Once | INTRAMUSCULAR | Status: AC
  Administered 2020-05-18: 30 mg via INTRAVENOUS
  Filled 2020-05-18: qty 1

## 2020-05-18 MED ORDER — FLUTICASONE PROPIONATE 50 MCG/ACT NA SUSP
2.0000 | Freq: Every day | NASAL | 0 refills | Status: AC
Start: 2020-05-18 — End: ?

## 2020-05-18 MED ORDER — SODIUM CHLORIDE 0.9 % IV BOLUS
1000.0000 mL | Freq: Once | INTRAVENOUS | Status: AC
  Administered 2020-05-18: 1000 mL via INTRAVENOUS

## 2020-05-18 MED ORDER — ONDANSETRON 4 MG PO TBDP: 4.0000 mg | ORAL_TABLET | Freq: Three times a day (TID) | ORAL | 0 refills | Status: DC | PRN

## 2020-05-18 NOTE — ED Notes (Signed)
Pt. Has been able to keep ginger ale down.

## 2020-05-18 NOTE — ED Notes (Signed)
Pt. With Ct 

## 2020-05-18 NOTE — ED Provider Notes (Signed)
[facility] EMERGENCY DEPARTMENT Provider Note   CSN: [id number] Arrival date & time: 05/18/20  1150    History Chief Complaint  Patient presents with  . multiple complaint    Catherine Gonzalez is a 69 y.o. female with past medical history significant for seizures, hypertension, hep C, allergic rhinitis, anemia who presents for evaluation multiple complaints.  Patient states she has not been feeling well for 2 weeks.  Feels like she has some nasal congestion without rhinorrhea as well as some frontal sinus pressure.  No vision changes, facial droop, paresthesias, lateral weakness.  No sudden onset thunderclap headache.  Is been taking Zyrtec without relief.  Patient also admits to nausea and NBNB emesis over the last few weeks.  States she can intermittently keep down cereal, yogurt and applesauce however a lot of foods "go right through me."  Has not been taking thing for emesis.  Apparently had bilateral ear fullness 2 weeks ago however this resolved.  No sore throat, sick contacts, chest pain, shortness of breath, dysuria.  Has chronic diarrhea since having her gallbladder removed years ago.  No melena or bright blood per rectum.  No pelvic pain, vaginal discharge or concerns for STDs.  No rashes or lesions.  No lightheadedness or dizziness.  Denies additional aggravating or relieving factors.  History obtained from patient, family in room and past medical records.  No interpreter used.  HPI     Past Medical History:  Diagnosis Date  . Allergic rhinitis   . Anemia   . Blood transfusion without reported diagnosis   . Ectopic pregnancy   . GERD (gastroesophageal reflux disease)   . Hepatitis C   . Hypertension   . Seizures [facility])     Patient Active Problem List   Diagnosis Date Noted  . Anemia 05/08/2013  . Unspecified constipation 05/08/2013  . SIADH (syndrome of inappropriate ADH production) ([place]) 05/08/2013  . Syncope and collapse 03/20/2013  . Hyponatremia 03/20/2013  .  Hypokalemia 03/20/2013  . Hep C w/o coma, chronic ([place]) 03/08/2013  . Hypertension     Past Surgical History:  Procedure Laterality Date  . APPENDECTOMY    . BRAIN SURGERY     x 3  . BREAST ENHANCEMENT SURGERY    . CHOLECYSTECTOMY    . ECTOPIC PREGNANCY SURGERY    . LUMBAR SPINE SURGERY    . TONSILLECTOMY AND ADENOIDECTOMY       OB History    Gravida  1   Para      Term      Preterm      AB  1   Living        SAB      TAB      Ectopic  1   Multiple      Live Births              Family History  Problem Relation Age of Onset  . Heart failure Mother   . Lung cancer Father   . Alcohol abuse Brother   . Leukemia Brother   . Colon cancer Neg Hx     Social History   Tobacco Use  . Smoking status: Current Every Day Smoker    Packs/day: 0.25    Years: 40.00    Pack years: 10.00    Types: Cigarettes  . Smokeless tobacco: Never Used  Vaping Use  . Vaping Use: Never used  Substance Use Topics  . Alcohol use: No  . Drug use: No  Home Medications Prior to Admission medications   Medication Sig Start Date End Date Taking? Authorizing Provider  amLODipine (NORVASC) 5 MG tablet Take 5 mg by mouth daily.   Yes [provider]  ferrous sulfate 325 (65 FE) MG tablet TAKE 1 TABLET BY MOUTH EVERY DAY WITH BREAKFAST Patient taking differently: Take 325 mg by mouth daily with breakfast.  03/23/14  Yes Susy Frizzle, MD  levETIRAcetam (KEPPRA) 750 MG tablet TAKE 1 TABLET TWICE A DAY Patient taking differently: Take 750 mg by mouth 2 (two) times daily.  04/11/14  Yes Susy Frizzle, MD  levothyroxine (SYNTHROID) 75 MCG tablet Take 75 mcg by mouth daily before breakfast.    Yes [provider]  olmesartan (BENICAR) 20 MG tablet Take 20 mg by mouth daily.   Yes [provider]  sodium bicarbonate 325 MG tablet Take 650 mg by mouth 4 (four) times daily -  with meals and at bedtime.    Yes [provider]  fluticasone  (FLONASE) 50 MCG/ACT nasal spray Place 2 sprays into both nostrils daily. 05/18/20   Breniya Goertzen A, PA-C  loratadine (CLARITIN) 10 MG tablet Take 1 tablet (10 mg total) by mouth daily. 05/18/20   Janay Canan A, PA-C  ondansetron (ZOFRAN ODT) 4 MG disintegrating tablet Take 1 tablet (4 mg total) by mouth every 8 (eight) hours as needed for nausea or vomiting. 05/18/20   Bryson Palen A, PA-C    Allergies    Codeine and Pegasys [peginterferon alfa-2a]  Review of Systems   Review of Systems  Constitutional: Positive for appetite change.  HENT: Positive for congestion, postnasal drip, sinus pressure and sinus pain. Negative for ear discharge (Ear fullness 2 weeks ago, none currently), ear pain, facial swelling, hearing loss, rhinorrhea, sneezing, sore throat, trouble swallowing and voice change.   Respiratory: Negative.   Cardiovascular: Negative.   Gastrointestinal: Positive for abdominal pain, diarrhea, nausea and vomiting. Negative for abdominal distention, blood in stool, constipation and rectal pain.  Genitourinary: Negative.   Musculoskeletal: Negative.   Skin: Negative.   Neurological: Positive for headaches (Intermittne x 2 weeks). Negative for dizziness, tremors, seizures, syncope, facial asymmetry, speech difficulty, weakness, light-headedness and numbness.  All other systems reviewed and are negative.   Physical Exam Updated Vital Signs BP (!) 136/97   Pulse 88   Temp 98.2 F (36.8 C) (Oral)   Resp 16   Ht 5\' 4"  (1.626 m)   Wt 47.7 kg   SpO2 99%   BMI 18.06 kg/m   Physical Exam Vitals and nursing note reviewed.  Constitutional:      General: She is not in acute distress.    Appearance: She is well-developed. She is not ill-appearing, toxic-appearing or diaphoretic.  HENT:     Head: Normocephalic and atraumatic.     Jaw: There is normal jaw occlusion.     Right Ear: Tympanic membrane, ear canal and external ear normal. There is no impacted cerumen. No  hemotympanum. Tympanic membrane is not injected, scarred, perforated, erythematous, retracted or bulging.     Left Ear: Tympanic membrane, ear canal and external ear normal. There is no impacted cerumen. No hemotympanum. Tympanic membrane is not injected, scarred, perforated, erythematous, retracted or bulging.     Ears:     Comments: No Mastoid tenderness.    Nose:     Comments: Clear rhinorrhea and congestion to bilateral nares.  No sinus tenderness.    Mouth/Throat:     Comments: Posterior oropharynx clear.  Mucous membranes moist.  Tonsils without erythema or exudate.  Uvula midline without deviation.  No evidence of PTA or RPA.  No drooling, dysphasia or trismus.  Phonation normal. Eyes:     Pupils: Pupils are equal, round, and reactive to light.     Comments: No horizontal, vertical or rotational nystagmus   Neck:     Trachea: Trachea and phonation normal.     Meningeal: Brudzinski's sign and Kernig's sign absent.     Comments: Full active and passive ROM without pain No midline or paraspinal tenderness No nuchal rigidity or meningeal signs  Cardiovascular:     Rate and Rhythm: Normal rate.     Comments: No murmurs rubs or gallops. Pulmonary:     Effort: No respiratory distress.     Comments: Clear to auscultation bilaterally without wheeze, rhonchi or rales.  No accessory muscle usage.  Able speak in full sentences. Abdominal:     General: There is no distension.     Comments: Soft, nontender without rebound or guarding.  No CVA tenderness.  Musculoskeletal:        General: Normal range of motion.     Cervical back: Normal range of motion.     Comments: Moves all 4 extremities without difficulty.  Lower extremities without edema, erythema or warmth.  Skin:    General: Skin is warm and dry.     Comments: Brisk capillary refill.  No rashes or lesions.  Neurological:     General: No focal deficit present.     Mental Status: She is alert and oriented to person, place, and time.       Comments: Mental Status:  Alert, oriented, thought content appropriate. Speech fluent without evidence of aphasia. Able to follow 2 step commands without difficulty.  Cranial Nerves:  II:  Peripheral visual fields grossly normal, pupils equal, round, reactive to light III,IV, VI: ptosis not present, extra-ocular motions intact bilaterally  V,VII: smile symmetric, facial light touch sensation equal VIII: hearing grossly normal bilaterally  IX,X: midline uvula rise  XI: bilateral shoulder shrug equal and strong XII: midline tongue extension  Motor:  5/5 in upper and lower extremities bilaterally including strong and equal grip strength and dorsiflexion/plantar flexion Sensory: Pinprick and light touch normal in all extremities.  Deep Tendon Reflexes: 2+ and symmetric  Cerebellar: normal finger-to-nose with bilateral upper extremities Gait: normal gait and balance CV: distal pulses palpable throughout     ED Results / Procedures / Treatments   Labs (all labs ordered are listed, but only abnormal results are displayed) Labs Reviewed  CBC WITH DIFFERENTIAL/PLATELET - Abnormal; Notable for the following components:      Result Value   WBC 12.5 (*)    Neutro Abs 8.2 (*)    Monocytes Absolute 1.1 (*)    All other components within normal limits  COMPREHENSIVE METABOLIC PANEL - Abnormal; Notable for the following components:   Potassium 3.4 (*)    Glucose, Bld 105 (*)    All other components within normal limits  URINALYSIS, ROUTINE W REFLEX MICROSCOPIC - Abnormal; Notable for the following components:   Color, Urine STRAW (*)    Specific Gravity, Urine 1.042 (*)    Hgb urine dipstick SMALL (*)    Ketones, ur 5 (*)    Bacteria, UA RARE (*)    All other components within normal limits  SARS CORONAVIRUS 2 BY RT PCR (HOSPITAL ORDER, [place] [facility])  LIPASE, BLOOD    EKG EKG Interpretation  Date/Time:  Saturday May 18 2020 15:26:39 EDT Ventricular Rate:   83 PR Interval:    QRS Duration: 107 QT Interval:  402 QTC Calculation: 473 R Axis:   134 Text Interpretation: Sinus rhythm Borderline short PR interval Consider right ventricular hypertrophy No STEMI Confirmed by Octaviano Glow [phone]) on 05/18/2020 3:46:42 PM   Radiology CT Head Wo Contrast  Result Date: 05/18/2020 CLINICAL DATA:  Acute headache EXAM: CT HEAD WITHOUT CONTRAST TECHNIQUE: Contiguous axial images were obtained from the base of the skull through the vertex without intravenous contrast. COMPARISON:  CT head dated 03/21/2019 FINDINGS: Brain: The patient is status post a right parietal craniotomy with large volume subjacent encephalomalacia of the parietal and occipital lobes, unchanged since 03/20/2013. No evidence of acute infarction, hemorrhage, hydrocephalus, extra-axial collection or mass lesion/mass effect. Vascular: There are vascular calcifications in the carotid siphons. Skull: No acute fracture. Sinuses/Orbits: No acute finding. Other: None. IMPRESSION: No acute intracranial process. Electronically Signed   By: Zerita Boers M.D.   On: 05/18/2020 16:16   CT Abdomen Pelvis W Contrast  Result Date: 05/18/2020 CLINICAL DATA:  Right-sided abdominal pain for 2 weeks EXAM: CT ABDOMEN AND PELVIS WITH CONTRAST TECHNIQUE: Multidetector CT imaging of the abdomen and pelvis was performed using the standard protocol following bolus administration of intravenous contrast. CONTRAST:  21mL OMNIPAQUE IOHEXOL 300 MG/ML  SOLN COMPARISON:  05/20/2017 FINDINGS: Lower chest: Coronary artery calcification.  Lung bases are clear. Hepatobiliary: No focal liver abnormality is seen. Status post cholecystectomy. Unchanged minimal intrahepatic biliary dilatation. Common bile duct remains mildly enlarged. Findings are likely postsurgical. Pancreas: Stable 4 mm low attenuating focus within the pancreatic body (series 2, image 27). Single coarse calcification within the pancreatic body. No pancreatic ductal  dilatation. No peripancreatic inflammatory changes or fluid. Spleen: Normal in size without focal abnormality. Adrenals/Urinary Tract: Nodular thickening of the bilateral adrenal glands is unchanged from prior, likely reflecting hyperplasia. Two tiny low-attenuation lesions within the left kidney are stable, likely small cysts. Kidneys are otherwise unremarkable. No stone or hydronephrosis. Urinary bladder is unremarkable for the degree of distension. Stomach/Bowel: Stomach is within normal limits. Appendix is surgically absent. Sigmoid diverticulosis. No evidence of bowel wall thickening, distention, or inflammatory changes. Vascular/Lymphatic: Advanced aortoiliac atherosclerotic calcification without aneurysm. No abdominopelvic lymphadenopathy. Reproductive: Uterus and bilateral adnexa are unremarkable. Other: No free air, no free fluid, and no abdominal wall hernia. Musculoskeletal: Advanced right hip osteoarthritis. No new or acute osseous findings. IMPRESSION: 1. No acute abdominopelvic findings. 2. Sigmoid diverticulosis without evidence of acute diverticulitis. 3. Stable 4 mm low-attenuation focus within the pancreatic body, likely cyst. 4. Aortic atherosclerosis. (ICD10-I70.0). Electronically Signed   By: Davina Poke D.O.   On: 05/18/2020 16:22    Procedures Procedures (including critical care time)  Medications Ordered in ED Medications  ondansetron (ZOFRAN) injection 4 mg (4 mg Intravenous Not Given 05/18/20 1521)  sodium chloride 0.9 % bolus 1,000 mL (0 mLs Intravenous Stopped 05/18/20 1827)  iohexol (OMNIPAQUE) 300 MG/ML solution 100 mL (75 mLs Intravenous Contrast Given 05/18/20 1550)  ketorolac (TORADOL) 30 MG/ML injection 30 mg (30 mg Intravenous Given 05/18/20 1716)   ED Course  I have reviewed the triage vital signs and the nursing notes.  Pertinent labs & imaging results that were available during my care of the patient were reviewed by me and considered in my medical decision  making (see chart for details).  69 year old presents for vaginal complaints.  She is afebrile, nonseptic, not ill-appearing.  Patient with congestion,  rhinorrhea, frontal sinus headache x2 weeks.  No sudden onset thunderclap headache.  She has nonfocal neuro exam without deficits.  No vision changes, neck pain, neck stiffness, paresthesias, weakness.  Does have some mild rhinorrhea on exam with some tenderness over frontal sinuses.  Patient also with diarrhea and emesis x10 days.  Has been able to keep down some liquids however diarrhea with solid food intake.  Does have some chronic diarrhea at baseline since her gallbladder was removed.  She has some tenderness to her suprapubic and right lower quadrant.  Compartments soft.  No overlying skin changes.  Normoactive bowel sounds.  Plan on labs, imaging and reassess.  Labs and imaging personally viewed interpreted: CBC with leukocytosis at 12.5 Covid negative  Lipase 33 Metabolic panel mild hypokalemia at 3.4, no electrolyte, renal, liver  abnormality Urinalysis negative for infection EKG no STEMI CT head no acute changes CT abdomen pelvis no acute changes  Patient reassessed.  Tolerating p.o. intake.  No diarrhea here in the ED.  Low suspicion for bacterial infectious source.  Headache resolved with some IV fluids.  Reassuring Head CT. Presentation non concerning for [facility], ICH, Meningitis, or temporal arteritis. Pt is afebrile with no focal neuro deficits, nuchal rigidity, or change in vision.  Patient's headache seems to be related to sinus congestion, no infectious process on CT scan.  Likely allergy related.  Will start on some Flonase.  May continue taking her antihistamines.  Patient is nontoxic, nonseptic appearing, in no apparent distress.  Patient's pain and other symptoms adequately managed in emergency department.  Fluid bolus given.  Labs, imaging and vitals reviewed.  Patient does not meet the SIRS or Sepsis criteria.  On repeat exam  patient does not have a surgical abdomin and there are no peritoneal signs.  No indication of appendicitis, bowel obstruction, bowel perforation, cholecystitis, diverticulitis.   The patient has been appropriately medically screened and/or stabilized in the ED. I have low suspicion for any other emergent medical condition which would require further screening, evaluation or treatment in the ED or require inpatient management.  Patient is hemodynamically stable and in no acute distress.  Patient able to ambulate in department prior to ED.  Evaluation does not show acute pathology that would require ongoing or additional emergent interventions while in the emergency department or further inpatient treatment.  I have discussed the diagnosis with the patient and answered all questions.  Pain is been managed while in the emergency department and patient has no further complaints prior to discharge.  Patient is comfortable with plan discussed in room and is stable for discharge at this time.  I have discussed strict return precautions for returning to the emergency department.  Patient was encouraged to follow-up with PCP/specialist refer to at discharge.     MDM Rules/Calculators/A&P                           Final Clinical Impression(s) / ED Diagnoses Final diagnoses:  Acute nonintractable headache, unspecified headache type  Congestion of nasal sinus  Nausea vomiting and diarrhea    Rx / DC Orders ED Discharge Orders         Ordered    fluticasone (FLONASE) 50 MCG/ACT nasal spray  Daily     Discontinue  Reprint     05/18/20 1852    loratadine (CLARITIN) 10 MG tablet  Daily     Discontinue  Reprint     05/18/20 1852  ondansetron (ZOFRAN ODT) 4 MG disintegrating tablet  Every 8 hours PRN     Discontinue  Reprint     05/18/20 1852           Ulani Degrasse A, PA-C 05/18/20 1854    Wyvonnia Dusky, MD 05/19/20 [phone]

## 2020-05-18 NOTE — ED Triage Notes (Signed)
Reports "sick x 2 weeks with sick sinus headaches"  Also reports can only eat applesauce and yogurt  Also an earache   Has PCP in [place] but has n ot seen  Nor contacted  Has taken Zyrtec without relief as well tylenol   None today

## 2020-05-18 NOTE — Discharge Instructions (Signed)
Take the medications as prescribed.  Follow-up outpatient for any worsening symptoms return here to emergency department

## 2020-07-17 ENCOUNTER — Ambulatory Visit (INDEPENDENT_AMBULATORY_CARE_PROVIDER_SITE_OTHER): Payer: Medicare Other

## 2020-07-17 ENCOUNTER — Ambulatory Visit
Admission: EM | Admit: 2020-07-17 | Discharge: 2020-07-17 | Disposition: A | Discharge status: Home / Self Care | Payer: Medicare Other | Attending: Emergency Medicine | Admitting: Emergency Medicine

## 2020-07-17 ENCOUNTER — Other Ambulatory Visit: Payer: Self-pay

## 2020-07-17 DIAGNOSIS — S6992XA Unspecified injury of left wrist, hand and finger(s), initial encounter: Secondary | ICD-10-CM

## 2020-07-17 DIAGNOSIS — M79642 Pain in left hand: Secondary | ICD-10-CM | POA: Diagnosis not present

## 2020-07-17 DIAGNOSIS — S62611A Displaced fracture of proximal phalanx of left index finger, initial encounter for closed fracture: Secondary | ICD-10-CM | POA: Diagnosis not present

## 2020-07-17 MED ORDER — TRAMADOL HCL 50 MG PO TABS: 50.0000 mg | ORAL_TABLET | Freq: Two times a day (BID) | ORAL | 0 refills | Status: DC

## 2020-07-17 NOTE — ED Triage Notes (Signed)
Pt had trip and fall and injured left hand , deformity noted

## 2020-07-17 NOTE — Discharge Instructions (Addendum)
Take Tylenol as needed for mild or moderate pain Take tramadol for severe pain Follow RICE instruction as attached Follow-up with PCP Return or go to ED for worsening of symptoms

## 2020-07-17 NOTE — ED Provider Notes (Signed)
[facility]   [id number] 07/17/20 Arrival Time: 8916   Chief Complaint  Patient presents with  . Hand Injury     SUBJECTIVE: History from: patient.  Catherine Gonzalez is a 69 y.o. female who presented to the urgent care with a complaint of left hand pain that occurred today.  Reports she tripped and fell.  She localizes the pain to the left index finger.  She describes the pain as constant and achy.  [place] any OTC medication.  Her symptoms are made worse with ROM.  She is denies similar symptoms in the past.  Denies chills, fever, nausea, vomiting, diarrhea.  ROS: As per HPI.  All other pertinent ROS negative.     Past Medical History:  Diagnosis Date  . Allergic rhinitis   . Anemia   . Blood transfusion without reported diagnosis   . Ectopic pregnancy   . GERD (gastroesophageal reflux disease)   . Hepatitis C   . Hypertension   . Seizures ([place])    Past Surgical History:  Procedure Laterality Date  . APPENDECTOMY    . BRAIN SURGERY     x 3  . BREAST ENHANCEMENT SURGERY    . CHOLECYSTECTOMY    . ECTOPIC PREGNANCY SURGERY    . LUMBAR SPINE SURGERY    . TONSILLECTOMY AND ADENOIDECTOMY     Allergies  Allergen Reactions  . Codeine Nausea And Vomiting  . Pegasys [Peginterferon Alfa-2a] Hives   No current facility-administered medications on file prior to encounter.   Current Outpatient Medications on File Prior to Encounter  Medication Sig Dispense Refill  . amLODipine (NORVASC) 5 MG tablet Take 5 mg by mouth daily.    . ferrous sulfate 325 (65 FE) MG tablet TAKE 1 TABLET BY MOUTH EVERY DAY WITH BREAKFAST (Patient taking differently: Take 325 mg by mouth daily with breakfast. ) 30 tablet 11  . fluticasone (FLONASE) 50 MCG/ACT nasal spray Place 2 sprays into both nostrils daily. 11.1 mL 0  . levETIRAcetam (KEPPRA) 750 MG tablet TAKE 1 TABLET TWICE A DAY (Patient taking differently: Take 750 mg by mouth 2 (two) times daily. ) 180 tablet 3  .  levothyroxine (SYNTHROID) 75 MCG tablet Take 75 mcg by mouth daily before breakfast.     . loratadine (CLARITIN) 10 MG tablet Take 1 tablet (10 mg total) by mouth daily. 30 tablet 0  . olmesartan (BENICAR) 20 MG tablet Take 20 mg by mouth daily.    . ondansetron (ZOFRAN ODT) 4 MG disintegrating tablet Take 1 tablet (4 mg total) by mouth every 8 (eight) hours as needed for nausea or vomiting. 20 tablet 0  . sodium bicarbonate 325 MG tablet Take 650 mg by mouth 4 (four) times daily -  with meals and at bedtime.      Social History   Socioeconomic History  . Marital status: Single    Spouse name: Not on file  . Number of children: 0  . Years of education: Not on file  . Highest education level: Not on file  Occupational History  . Not on file  Tobacco Use  . Smoking status: Current Every Day Smoker    Packs/day: 0.25    Years: 40.00    Pack years: 10.00    Types: Cigarettes  . Smokeless tobacco: Never Used  Vaping Use  . Vaping Use: Never used  Substance and Sexual Activity  . Alcohol use: No  . Drug use: No  . Sexual activity: Never  Other Topics Concern  .  Not on file  Social History Narrative  . Not on file   Social Determinants of Health   Financial Resource Strain:   . Difficulty of Paying Living Expenses:   Food Insecurity:   . Worried About Charity fundraiser in the Last Year:   . Arboriculturist in the Last Year:   Transportation Needs:   . Film/video editor (Medical):   Marland Kitchen Lack of Transportation (Non-Medical):   Physical Activity:   . Days of Exercise per Week:   . Minutes of Exercise per Session:   Stress:   . Feeling of Stress :   Social Connections:   . Frequency of Communication with Friends and Family:   . Frequency of Social Gatherings with Friends and Family:   . Attends Religious Services:   . Active Member of Clubs or Organizations:   . Attends Archivist Meetings:   Marland Kitchen Marital Status:   Intimate Partner Violence:   . Fear of  Current or Ex-Partner:   . Emotionally Abused:   Marland Kitchen Physically Abused:   . Sexually Abused:    Family History  Problem Relation Age of Onset  . Heart failure Mother   . Lung cancer Father   . Alcohol abuse Brother   . Leukemia Brother   . Colon cancer Neg Hx     OBJECTIVE:  Vitals:   07/17/20 1739  BP: 119/75  Pulse: (!) 103  Resp: 16  Temp: 98.6 F (37 C)  TempSrc: Oral  SpO2: 98%     Physical Exam Vitals and nursing note reviewed.  Constitutional:      General: She is not in acute distress.    Appearance: Normal appearance. She is normal weight. She is not ill-appearing, toxic-appearing or diaphoretic.  HENT:     Head: Normocephalic.  Cardiovascular:     Rate and Rhythm: Normal rate and regular rhythm.     Pulses: Normal pulses.     Heart sounds: Normal heart sounds. No murmur heard.  No friction rub. No gallop.   Pulmonary:     Effort: Pulmonary effort is normal. No respiratory distress.     Breath sounds: Normal breath sounds. No stridor. No wheezing, rhonchi or rales.  Chest:     Chest wall: No tenderness.  Musculoskeletal:        General: Swelling and tenderness present.     Right hand: Normal.     Left hand: Swelling and tenderness present.     Comments: The left hand is with obvious deformity and compared to the right hand.  Swelling and ecchymosis present around the left index joint.  There is no open wound, lesion, surface trauma present.  Unable to complete range of motion due to pain in the left index finger.  Pain to palpation.  Neurovascular status intact.  Neurological:     Mental Status: She is alert and oriented to person, place, and time.     LABS:  No results found for this or any previous visit (from the past 24 hour(s)).   RADIOLOGY  DG Hand Complete Left  Result Date: 07/17/2020 CLINICAL DATA:  69 year old female with fall and trauma to the left hand. EXAM: LEFT HAND - COMPLETE 3+ VIEW COMPARISON:  None. FINDINGS: There is a mildly  displaced and angulated fracture of the base of the proximal phalanx of the index finger with dorsal angulation of the distal fracture fragment. No other acute fracture identified. There is advanced osteopenia. There is no dislocation. The  soft tissues are unremarkable. IMPRESSION: Mildly displaced and angulated fracture of the base of the proximal phalanx of the index finger. Electronically Signed   By: Anner Crete M.D.   On: 07/17/2020 18:42     ASSESSMENT & PLAN:  1. Left hand pain   2. Closed displaced fracture of proximal phalanx of left index finger, initial encounter     Meds ordered this encounter  Medications  . traMADol (ULTRAM) 50 MG tablet    Sig: Take 1 tablet (50 mg total) by mouth 2 (two) times daily.    Dispense:  8 tablet    Refill:  0   Patient stable at discharge. Left hand X-ray is positive for for acute fracture.  I have reviewed the x-ray myself and the radiologist interpretation.  I am in agreement with the radiologist interpretation.  Discharge instructions Take Tylenol as needed for mild or moderate pain Take tramadol for severe pain Follow RICE instruction as attached Follow-up with PCP Return or go to ED for worsening of symptoms  Reviewed expectations re: course of current medical issues. Questions answered. Outlined signs and symptoms indicating need for more acute intervention. Patient verbalized understanding. After Visit Summary given.    PDMP reviewed during this encounter.   Note: This document was prepared using Dragon voice recognition software and may include unintentional dictation errors.    Emerson Monte, FNP 07/17/20 1902

## 2020-07-18 ENCOUNTER — Ambulatory Visit (INDEPENDENT_AMBULATORY_CARE_PROVIDER_SITE_OTHER): Payer: Medicare Other | Admitting: Orthopedic Surgery

## 2020-07-18 ENCOUNTER — Telehealth: Payer: Self-pay

## 2020-07-18 ENCOUNTER — Encounter: Payer: Self-pay | Admitting: Orthopedic Surgery

## 2020-07-18 VITALS — Ht 66.0 in | Wt 103.0 lb

## 2020-07-18 DIAGNOSIS — M79644 Pain in right finger(s): Secondary | ICD-10-CM

## 2020-07-18 NOTE — Telephone Encounter (Signed)
[facility] and spoke with triage nurse about getting patient seen tomorrow per Dr Forbes Cellar request. They did take patients information, will speak with provider and call and reach out to patient or family member Catherine Gonzalez (who was present with patient at her appt today) about getting her seen at [facility].

## 2020-07-19 ENCOUNTER — Other Ambulatory Visit: Payer: Self-pay | Admitting: Orthopedic Surgery

## 2020-07-19 DIAGNOSIS — S62611A Displaced fracture of proximal phalanx of left index finger, initial encounter for closed fracture: Secondary | ICD-10-CM | POA: Insufficient documentation

## 2020-07-20 ENCOUNTER — Encounter: Payer: Self-pay | Admitting: Orthopedic Surgery

## 2020-07-20 NOTE — Progress Notes (Signed)
   Post-Op Visit Note   Patient: Catherine Gonzalez           Date of Birth: 27-Nov-1951           MRN: [id number] Visit Date: 07/18/2020 PCP: [facility]   Assessment & Plan:  Chief Complaint:  Chief Complaint  Patient presents with  . Left Hand - Fracture   Visit Diagnoses:  1. Finger pain, right     Plan: Zaneta has a finger fracture.  This is an operative fracture.  Radiographs are reviewed.  She is here for evaluation but needs to be evaluated by hand surgeon.  Her caregiver actually told the emergency department the same thing but they sent her here instead.  Understandable mistake but this is a fracture that will require a higher level of expertise than we have currently in our office.  For that reason we referred her onto that hand surgeon.  No charge visit for today.  Follow-Up Instructions: No follow-ups on file.   Orders:  No orders of the defined types were placed in this encounter.  No orders of the defined types were placed in this encounter.   Imaging: No results found.  PMFS History: Patient Active Problem List   Diagnosis Date Noted  . Anemia 05/08/2013  . Unspecified constipation 05/08/2013  . SIADH (syndrome of inappropriate ADH production) ([place]) 05/08/2013  . Syncope and collapse 03/20/2013  . Hyponatremia 03/20/2013  . Hypokalemia 03/20/2013  . Hep C w/o coma, chronic ([employer]) 03/08/2013  . Hypertension    Past Medical History:  Diagnosis Date  . Allergic rhinitis   . Anemia   . Blood transfusion without reported diagnosis   . Ectopic pregnancy   . GERD (gastroesophageal reflux disease)   . Hepatitis C   . Hypertension   . Seizures ([place])     Family History  Problem Relation Age of Onset  . Heart failure Mother   . Lung cancer Father   . Alcohol abuse Brother   . Leukemia Brother   . Colon cancer Neg Hx     Past Surgical History:  Procedure Laterality Date  . APPENDECTOMY    . BRAIN SURGERY     x 3  . BREAST ENHANCEMENT  SURGERY    . CHOLECYSTECTOMY    . ECTOPIC PREGNANCY SURGERY    . LUMBAR SPINE SURGERY    . TONSILLECTOMY AND ADENOIDECTOMY     Social History   Occupational History  . Not on file  Tobacco Use  . Smoking status: Current Every Day Smoker    Packs/day: 0.25    Years: 40.00    Pack years: 10.00    Types: Cigarettes  . Smokeless tobacco: Never Used  Vaping Use  . Vaping Use: Never used  Substance and Sexual Activity  . Alcohol use: No  . Drug use: No  . Sexual activity: Never

## 2020-07-22 ENCOUNTER — Other Ambulatory Visit (HOSPITAL_COMMUNITY)
Admission: RE | Admit: 2020-07-22 | Discharge: 2020-07-22 | Disposition: A | Discharge status: Home / Self Care | Payer: Medicare Other | Source: Ambulatory Visit | Attending: Orthopedic Surgery | Admitting: Orthopedic Surgery

## 2020-07-22 ENCOUNTER — Encounter (HOSPITAL_BASED_OUTPATIENT_CLINIC_OR_DEPARTMENT_OTHER): Payer: Self-pay | Admitting: Orthopedic Surgery

## 2020-07-22 ENCOUNTER — Other Ambulatory Visit: Payer: Self-pay

## 2020-07-22 DIAGNOSIS — Z01812 Encounter for preprocedural laboratory examination: Secondary | ICD-10-CM | POA: Diagnosis present

## 2020-07-22 DIAGNOSIS — Z20822 Contact with and (suspected) exposure to covid-19: Secondary | ICD-10-CM | POA: Diagnosis not present

## 2020-07-22 LAB — SARS CORONAVIRUS 2 (TAT 6-24 HRS): SARS Coronavirus 2: NEGATIVE

## 2020-07-25 ENCOUNTER — Encounter (HOSPITAL_BASED_OUTPATIENT_CLINIC_OR_DEPARTMENT_OTHER): Payer: Self-pay | Admitting: Orthopedic Surgery

## 2020-07-25 ENCOUNTER — Other Ambulatory Visit: Payer: Self-pay

## 2020-07-25 ENCOUNTER — Ambulatory Visit (HOSPITAL_BASED_OUTPATIENT_CLINIC_OR_DEPARTMENT_OTHER): Payer: Medicare Other | Admitting: Anesthesiology

## 2020-07-25 ENCOUNTER — Encounter (HOSPITAL_BASED_OUTPATIENT_CLINIC_OR_DEPARTMENT_OTHER)
Admission: RE | Disposition: A | Discharge status: Home / Self Care | Payer: Self-pay | Source: Home / Self Care | Attending: Orthopedic Surgery

## 2020-07-25 ENCOUNTER — Ambulatory Visit (HOSPITAL_BASED_OUTPATIENT_CLINIC_OR_DEPARTMENT_OTHER)
Admission: RE | Admit: 2020-07-25 | Discharge: 2020-07-25 | Disposition: A | Discharge status: Home / Self Care | Payer: Medicare Other | Attending: Orthopedic Surgery | Admitting: Orthopedic Surgery

## 2020-07-25 DIAGNOSIS — G40909 Epilepsy, unspecified, not intractable, without status epilepticus: Secondary | ICD-10-CM | POA: Insufficient documentation

## 2020-07-25 DIAGNOSIS — W19XXXA Unspecified fall, initial encounter: Secondary | ICD-10-CM | POA: Diagnosis not present

## 2020-07-25 DIAGNOSIS — K219 Gastro-esophageal reflux disease without esophagitis: Secondary | ICD-10-CM | POA: Insufficient documentation

## 2020-07-25 DIAGNOSIS — S62611A Displaced fracture of proximal phalanx of left index finger, initial encounter for closed fracture: Secondary | ICD-10-CM | POA: Diagnosis present

## 2020-07-25 DIAGNOSIS — E039 Hypothyroidism, unspecified: Secondary | ICD-10-CM | POA: Diagnosis not present

## 2020-07-25 DIAGNOSIS — F17210 Nicotine dependence, cigarettes, uncomplicated: Secondary | ICD-10-CM | POA: Diagnosis not present

## 2020-07-25 DIAGNOSIS — M1990 Unspecified osteoarthritis, unspecified site: Secondary | ICD-10-CM | POA: Insufficient documentation

## 2020-07-25 DIAGNOSIS — I10 Essential (primary) hypertension: Secondary | ICD-10-CM | POA: Insufficient documentation

## 2020-07-25 DIAGNOSIS — B1920 Unspecified viral hepatitis C without hepatic coma: Secondary | ICD-10-CM | POA: Diagnosis not present

## 2020-07-25 HISTORY — DX: Fracture of unspecified phalanx of unspecified finger, initial encounter for closed fracture: S62.609A

## 2020-07-25 HISTORY — DX: Hypothyroidism, unspecified: E03.9

## 2020-07-25 HISTORY — DX: Anxiety disorder, unspecified: F41.9

## 2020-07-25 HISTORY — PX: CLOSED REDUCTION FINGER WITH PERCUTANEOUS PINNING: SHX5612

## 2020-07-25 HISTORY — DX: Depression, unspecified: F32.A

## 2020-07-25 SURGERY — CLOSED REDUCTION, FINGER, WITH PERCUTANEOUS PINNING
Anesthesia: Monitor Anesthesia Care | Site: Finger | Laterality: Left

## 2020-07-25 MED ORDER — FENTANYL CITRATE (PF) 100 MCG/2ML IJ SOLN
100.0000 ug | Freq: Once | INTRAMUSCULAR | Status: AC
  Administered 2020-07-25: 50 ug via INTRAVENOUS

## 2020-07-25 MED ORDER — LACTATED RINGERS IV SOLN: INTRAVENOUS | Status: DC

## 2020-07-25 MED ORDER — ONDANSETRON HCL 4 MG/2ML IJ SOLN
INTRAMUSCULAR | Status: AC
  Filled 2020-07-25: qty 2

## 2020-07-25 MED ORDER — PROMETHAZINE HCL 25 MG/ML IJ SOLN: 6.2500 mg | INTRAMUSCULAR | Status: DC | PRN

## 2020-07-25 MED ORDER — DIPHENHYDRAMINE HCL 50 MG/ML IJ SOLN
INTRAMUSCULAR | Status: AC
  Filled 2020-07-25: qty 1

## 2020-07-25 MED ORDER — ROPIVACAINE HCL 5 MG/ML IJ SOLN
INTRAMUSCULAR | Status: DC | PRN
  Administered 2020-07-25: 30 mL via PERINEURAL

## 2020-07-25 MED ORDER — EPHEDRINE 5 MG/ML INJ
INTRAVENOUS | Status: AC
  Filled 2020-07-25: qty 10

## 2020-07-25 MED ORDER — OXYCODONE HCL 5 MG/5ML PO SOLN: 5.0000 mg | Freq: Once | ORAL | Status: DC | PRN

## 2020-07-25 MED ORDER — MIDAZOLAM HCL 2 MG/2ML IJ SOLN
2.0000 mg | Freq: Once | INTRAMUSCULAR | Status: AC
  Administered 2020-07-25: 2 mg via INTRAVENOUS

## 2020-07-25 MED ORDER — MIDAZOLAM HCL 5 MG/5ML IJ SOLN
INTRAMUSCULAR | Status: DC | PRN
  Administered 2020-07-25: 1 mg via INTRAVENOUS

## 2020-07-25 MED ORDER — PROPOFOL 500 MG/50ML IV EMUL
INTRAVENOUS | Status: DC | PRN
  Administered 2020-07-25: 100 ug/kg/min via INTRAVENOUS

## 2020-07-25 MED ORDER — TRAMADOL HCL 50 MG PO TABS: 50.0000 mg | ORAL_TABLET | Freq: Four times a day (QID) | ORAL | 0 refills | Status: DC | PRN

## 2020-07-25 MED ORDER — ONDANSETRON HCL 4 MG/2ML IJ SOLN
INTRAMUSCULAR | Status: DC | PRN
  Administered 2020-07-25: 4 mg via INTRAVENOUS

## 2020-07-25 MED ORDER — FENTANYL CITRATE (PF) 100 MCG/2ML IJ SOLN
INTRAMUSCULAR | Status: AC
  Filled 2020-07-25: qty 2

## 2020-07-25 MED ORDER — PROPOFOL 500 MG/50ML IV EMUL
INTRAVENOUS | Status: AC
  Filled 2020-07-25: qty 50

## 2020-07-25 MED ORDER — PHENYLEPHRINE 40 MCG/ML (10ML) SYRINGE FOR IV PUSH (FOR BLOOD PRESSURE SUPPORT)
PREFILLED_SYRINGE | INTRAVENOUS | Status: AC
  Filled 2020-07-25: qty 10

## 2020-07-25 MED ORDER — OXYCODONE HCL 5 MG PO TABS: 5.0000 mg | ORAL_TABLET | Freq: Once | ORAL | Status: DC | PRN

## 2020-07-25 MED ORDER — MIDAZOLAM HCL 2 MG/2ML IJ SOLN
INTRAMUSCULAR | Status: AC
  Filled 2020-07-25: qty 2

## 2020-07-25 MED ORDER — SUCCINYLCHOLINE CHLORIDE 200 MG/10ML IV SOSY
PREFILLED_SYRINGE | INTRAVENOUS | Status: AC
  Filled 2020-07-25: qty 10

## 2020-07-25 MED ORDER — CEFAZOLIN SODIUM-DEXTROSE 2-4 GM/100ML-% IV SOLN
INTRAVENOUS | Status: AC
  Filled 2020-07-25: qty 100

## 2020-07-25 MED ORDER — LIDOCAINE 2% (20 MG/ML) 5 ML SYRINGE
INTRAMUSCULAR | Status: AC
  Filled 2020-07-25: qty 5

## 2020-07-25 MED ORDER — HYDROMORPHONE HCL 1 MG/ML IJ SOLN: 0.2500 mg | INTRAMUSCULAR | Status: DC | PRN

## 2020-07-25 MED ORDER — CEFAZOLIN SODIUM-DEXTROSE 2-4 GM/100ML-% IV SOLN
2.0000 g | INTRAVENOUS | Status: AC
  Administered 2020-07-25: 2 g via INTRAVENOUS

## 2020-07-25 SURGICAL SUPPLY — 50 items
APL PRP STRL LF DISP 70% ISPRP (MISCELLANEOUS) ×1
BLADE MINI RND TIP GREEN BEAV (BLADE) IMPLANT
BLADE SURG 15 STRL LF DISP TIS (BLADE) ×1 IMPLANT
BLADE SURG 15 STRL SS (BLADE) ×3
BNDG CMPR 9X4 STRL LF SNTH (GAUZE/BANDAGES/DRESSINGS) ×1
BNDG COHESIVE 3X5 TAN STRL LF (GAUZE/BANDAGES/DRESSINGS) ×3 IMPLANT
BNDG ESMARK 4X9 LF (GAUZE/BANDAGES/DRESSINGS) ×3 IMPLANT
BNDG GAUZE ELAST 4 BULKY (GAUZE/BANDAGES/DRESSINGS) ×2 IMPLANT
CHLORAPREP W/TINT 26 (MISCELLANEOUS) ×3 IMPLANT
CORD BIPOLAR FORCEPS 12FT (ELECTRODE) ×3 IMPLANT
COVER BACK TABLE 60X90IN (DRAPES) ×3 IMPLANT
COVER MAYO STAND STRL (DRAPES) ×3 IMPLANT
COVER WAND RF STERILE (DRAPES) IMPLANT
CUFF TOURN SGL QUICK 18X4 (TOURNIQUET CUFF) ×2 IMPLANT
DECANTER SPIKE VIAL GLASS SM (MISCELLANEOUS) IMPLANT
DRAPE EXTREMITY T 121X128X90 (DISPOSABLE) ×3 IMPLANT
DRAPE OEC MINIVIEW 54X84 (DRAPES) ×3 IMPLANT
DRAPE SURG 17X23 STRL (DRAPES) ×3 IMPLANT
GAUZE SPONGE 4X4 12PLY STRL (GAUZE/BANDAGES/DRESSINGS) ×3 IMPLANT
GAUZE XEROFORM 1X8 LF (GAUZE/BANDAGES/DRESSINGS) ×3 IMPLANT
GLOVE BIOGEL PI IND STRL 8.5 (GLOVE) ×1 IMPLANT
GLOVE BIOGEL PI INDICATOR 8.5 (GLOVE) ×2
GLOVE SURG ORTHO 8.0 STRL STRW (GLOVE) ×3 IMPLANT
GOWN STRL REUS W/ TWL LRG LVL3 (GOWN DISPOSABLE) IMPLANT
GOWN STRL REUS W/TWL LRG LVL3 (GOWN DISPOSABLE) ×3
GOWN STRL REUS W/TWL XL LVL3 (GOWN DISPOSABLE) ×3 IMPLANT
K-WIRE .035 (WIRE) ×3
KWIRE .035 (WIRE) ×1 IMPLANT
KWIRE FIXATION L4 .035 (WIRE) IMPLANT
NDL PRECISIONGLIDE 27X1.5 (NEEDLE) IMPLANT
NEEDLE PRECISIONGLIDE 27X1.5 (NEEDLE) ×3 IMPLANT
NS IRRIG 1000ML POUR BTL (IV SOLUTION) ×3 IMPLANT
PACK BASIN DAY SURGERY FS (CUSTOM PROCEDURE TRAY) ×3 IMPLANT
PAD CAST 3X4 CTTN HI CHSV (CAST SUPPLIES) ×1 IMPLANT
PADDING CAST ABS 4INX4YD NS (CAST SUPPLIES)
PADDING CAST ABS COTTON 4X4 ST (CAST SUPPLIES) ×1 IMPLANT
PADDING CAST COTTON 3X4 STRL (CAST SUPPLIES) ×3
SLEEVE SCD COMPRESS KNEE MED (MISCELLANEOUS) ×2 IMPLANT
SLING ARM FOAM STRAP MED (SOFTGOODS) ×2 IMPLANT
SPLINT PLASTER CAST XFAST 3X15 (CAST SUPPLIES) IMPLANT
SPLINT PLASTER XTRA FASTSET 3X (CAST SUPPLIES) ×20
STOCKINETTE 4X48 STRL (DRAPES) ×3 IMPLANT
SUT CHROMIC 5 0 P 3 (SUTURE) IMPLANT
SUT ETHILON 4 0 PS 2 18 (SUTURE) ×3 IMPLANT
SUT MERSILENE 4 0 P 3 (SUTURE) IMPLANT
SUT VICRYL 4-0 PS2 18IN ABS (SUTURE) IMPLANT
SYR BULB EAR ULCER 3OZ GRN STR (SYRINGE) ×3 IMPLANT
SYR CONTROL 10ML LL (SYRINGE) ×2 IMPLANT
TOWEL GREEN STERILE FF (TOWEL DISPOSABLE) ×3 IMPLANT
UNDERPAD 30X36 HEAVY ABSORB (UNDERPADS AND DIAPERS) ×3 IMPLANT

## 2020-07-25 NOTE — Brief Op Note (Signed)
07/25/2020  11:00 AM  PATIENT:  Catherine Gonzalez  69 y.o. female  PRE-OPERATIVE DIAGNOSIS:  FRACTURE PROXIMAL PHALANX LEFT INDEX FINGER  POST-OPERATIVE DIAGNOSIS:  FRACTURE PROXIMAL PHALANX LEFT INDEX FINGER  PROCEDURE:  Procedure(s) with comments: CLOSED REDUCTION FINGER WITH PERCUTANEOUS PINNING (Left) - AXILLARY BLOCK  SURGEON:  Surgeon(s) and Role:    * Daryll Brod, MD - Primary    * Leanora Cover, MD - Assisting  PHYSICIAN ASSISTANT:   ASSISTANTS: K Shaneeka Scarboro,MD   ANESTHESIA:   regional and IV sedation  EBL:  78ml  BLOOD ADMINISTERED:none  DRAINS: none   LOCAL MEDICATIONS USED:  NONE  SPECIMEN:  No Specimen  DISPOSITION OF SPECIMEN:  N/A  COUNTS:  YES  TOURNIQUET: none DICTATION: .Dragon Dictation  PLAN OF CARE: Discharge to home after PACU  PATIENT DISPOSITION:  PACU - hemodynamically stable.

## 2020-07-25 NOTE — Progress Notes (Signed)
Assisted Dr. Miller with left, ultrasound guided, supraclavicular block. Side rails up, monitors on throughout procedure. See vital signs in flow sheet. Tolerated Procedure well. 

## 2020-07-25 NOTE — Anesthesia Procedure Notes (Signed)
Anesthesia Regional Block: Supraclavicular block   Pre-Anesthetic Checklist: ,, timeout performed, Correct Patient, Correct Site, Correct Laterality, Correct Procedure, Correct Position, site marked, Risks and benefits discussed,  Surgical consent,  Pre-op evaluation,  At surgeon's request and post-op pain management  Laterality: Left  Prep: chloraprep       Needles:  Injection technique: Single-shot  Needle Type: Stimiplex     Needle Length: 9cm  Needle Gauge: 21     Additional Needles:   Procedures:,,,, ultrasound used (permanent image in chart),,,,  Narrative:  Start time: 07/25/2020 10:17 AM End time: 07/25/2020 10:22 AM Injection made incrementally with aspirations every 5 mL.  Performed by: Personally  Anesthesiologist: Lynda Rainwater, MD

## 2020-07-25 NOTE — Discharge Instructions (Addendum)
Hand Center Instructions Hand Surgery  Wound Care: Keep your hand elevated above the level of your heart.  Do not allow it to dangle by your side.  Keep the dressing dry and do not remove it unless your doctor advises you to do so.  He will usually change it at the time of your post-op visit.  Moving your fingers is advised to stimulate circulation but will depend on the site of your surgery.  If you have a splint applied, your doctor will advise you regarding movement.  Activity: Do not drive or operate machinery today.  Rest today and then you may return to your normal activity and work as indicated by your physician.  Diet:  Drink liquids today or eat a light diet.  You may resume a regular diet tomorrow.    General expectations: Pain for two to three days. Fingers may become slightly swollen.  Call your doctor if any of the following occur: Severe pain not relieved by pain medication. Elevated temperature. Dressing soaked with blood. Inability to move fingers. White or bluish color to fingers.   Post Anesthesia Home Care Instructions  Activity: Get plenty of rest for the remainder of the day. A responsible individual must stay with you for 24 hours following the procedure.  For the next 24 hours, DO NOT: -Drive a car -Operate machinery -Drink alcoholic beverages -Take any medication unless instructed by your physician -Make any legal decisions or sign important papers.  Meals: Start with liquid foods such as gelatin or soup. Progress to regular foods as tolerated. Avoid greasy, spicy, heavy foods. If nausea and/or vomiting occur, drink only clear liquids until the nausea and/or vomiting subsides. Call your physician if vomiting continues.  Special Instructions/Symptoms: Your throat may feel dry or sore from the anesthesia or the breathing tube placed in your throat during surgery. If this causes discomfort, gargle with warm salt water. The discomfort should disappear within  24 hours.     Regional Anesthesia Blocks  1. Numbness or the inability to move the "blocked" extremity may last from 3-48 hours after placement. The length of time depends on the medication injected and your individual response to the medication. If the numbness is not going away after 48 hours, call your surgeon.  2. The extremity that is blocked will need to be protected until the numbness is gone and the  Strength has returned. Because you cannot feel it, you will need to take extra care to avoid injury. Because it may be weak, you may have difficulty moving it or using it. You may not know what position it is in without looking at it while the block is in effect.  3. For blocks in the legs and feet, returning to weight bearing and walking needs to be done carefully. You will need to wait until the numbness is entirely gone and the strength has returned. You should be able to move your leg and foot normally before you try and bear weight or walk. You will need someone to be with you when you first try to ensure you do not fall and possibly risk injury.  4. Bruising and tenderness at the needle site are common side effects and will resolve in a few days.  5. Persistent numbness or new problems with movement should be communicated to the surgeon or the [facility] ([phone])/ [facility] ([phone]).  

## 2020-07-25 NOTE — Transfer of Care (Signed)
Immediate Anesthesia Transfer of Care Note  Patient: Catherine Gonzalez  Procedure(s) Performed: CLOSED REDUCTION FINGER WITH PERCUTANEOUS PINNING (Left Finger)  Patient Location: PACU  Anesthesia Type:GA combined with regional for post-op pain  Level of Consciousness: awake, alert  and oriented  Airway & Oxygen Therapy: Patient Spontanous Breathing and Patient connected to face mask oxygen  Post-op Assessment: Report given to RN and Post -op Vital signs reviewed and stable  Post vital signs: Reviewed and stable  Last Vitals:  Vitals Value Taken Time  BP    Temp    Pulse 72 07/25/20 1102  Resp 15 07/25/20 1102  SpO2 97 % 07/25/20 1102  Vitals shown include unvalidated device data.  Last Pain:  Vitals:   07/25/20 0938  TempSrc: Oral  PainSc: 7          Complications: No complications documented.

## 2020-07-25 NOTE — Anesthesia Preprocedure Evaluation (Signed)
Anesthesia Evaluation  Patient identified by MRN, date of birth, ID band Patient awake    Reviewed: Allergy & Precautions, NPO status , Patient's Chart, lab work & pertinent test results  Airway Mallampati: II  TM Distance: >3 FB Neck ROM: Full    Dental no notable dental hx.    Pulmonary neg pulmonary ROS, Current Smoker and Patient abstained from smoking.,    Pulmonary exam normal breath sounds clear to auscultation       Cardiovascular hypertension, Pt. on medications negative cardio ROS Normal cardiovascular exam Rhythm:Regular Rate:Normal     Neuro/Psych Anxiety Depression negative psych ROS   GI/Hepatic GERD  ,(+) Hepatitis -, C  Endo/Other  Hypothyroidism   Renal/GU negative Renal ROS  negative genitourinary   Musculoskeletal negative musculoskeletal ROS (+)   Abdominal   Peds negative pediatric ROS (+)  Hematology negative hematology ROS (+)   Anesthesia Other Findings   Reproductive/Obstetrics negative OB ROS                             Anesthesia Physical Anesthesia Plan  ASA: III  Anesthesia Plan: MAC and Regional   Post-op Pain Management:    Induction: Intravenous  PONV Risk Score and Plan: 1 and Ondansetron and Treatment may vary due to age or medical condition  Airway Management Planned: Simple Face Mask  Additional Equipment:   Intra-op Plan:   Post-operative Plan:   Informed Consent: I have reviewed the patients History and Physical, chart, labs and discussed the procedure including the risks, benefits and alternatives for the proposed anesthesia with the patient or authorized representative who has indicated his/her understanding and acceptance.     Dental advisory given  Plan Discussed with: CRNA  Anesthesia Plan Comments:         Anesthesia Quick Evaluation

## 2020-07-25 NOTE — Op Note (Signed)
NAME: NARDOS PUTNAM MEDICAL RECORD NO: [id number] DATE OF BIRTH: 1951/07/28 FAC[facility] LOCATION: [facility] PHYSICIAN: Wynonia Sours, MD   OPERATIVE REPORT   DATE OF PROCEDURE: 07/25/20    PREOPERATIVE DIAGNOSIS:   Fracture proximal phalanx left index finger   POSTOPERATIVE DIAGNOSIS:  same   PROCEDURE:   Closed reduction percutaneous pinning proximal phalanx fracture left index finger   SURGEON: Daryll Brod, M.D.   ASSISTANT: Leanora Cover, MD   ANESTHESIA:  Regional with sedation   INTRAVENOUS FLUIDS:  Per anesthesia flow sheet.   ESTIMATED BLOOD LOSS:  Minimal.   COMPLICATIONS:  None.   SPECIMENS:  none   TOURNIQUET TIME:   * Missing tourniquet times found for documented tourniquets in log: [id number] *   DISPOSITION:  Stable to PACU.   INDICATIONS: Patient is a [age]-year-old female who sustained a fall resulting in a fracture of the proximal phalanx of her left index finger.  This is volarly angulated as is typical of a very proximal fractures of the proximal phalanx she is admitted for closed reduction percutaneous pinning.  Preperi-and postoperative course been discussed along with risks and complications.  She is aware there is no guarantee to the surgery the possibility of infection recurrence injury to arteries nerves tendons complete relief symptoms dystrophy.  Preoperative area the patient is seen extremity marked by both patient and surgeon antibiotic given a supraclavicular block was carried out without difficulty under the direction the anesthesia department.  OPERATIVE COURSE: Patient is brought to the operating room placed in the supine position prepped using ChloraPrep a 3-minute dry time was allowed timeout taken to confirm patient procedure.  The finger was manipulated and reduction confirmed with image intensification.  235 K wires were then passed through the proximal fracture into the distal phalanx stabilizing at both AP lateral  direction confirmed with image intensification.  Pins were bent cut short.  A sterile compressive dressing dorsal splint with the metacarpal phalangeal joint fully flexed was applied.  No tourniquet was used.  She was taken to the recovery room for observation in satisfactory condition.  She will be discharged home to return to the hand center [facility] in 1week Tylenol ibuprofen for pain with Norco for breakthrough.   Daryll Brod, MD Electronically signed, 07/25/20

## 2020-07-25 NOTE — H&P (Signed)
Catherine Gonzalez is an 69 y.o. female.   Chief Complaint: pain left handHPI: Catherine Gonzalez is a 69 year old right-hand-dominant female referred by Dr. Marlou Sa for consultation regarding an injury she sustained in a fall on 07/18/2020. She landed on her left hand with a fall on the outstretched hand going into her laundry room. She has a balance problem secondary to brain tumor excision in the past. X-rays revealed a fracture of the proximal phalanx index finger with displacement. She was placed in a AlumaFoam splint and on Ultram. Which she states is given her minimal relief. She has no prior history of injury. She has a history of thyroid problems arthritis. She has no history of diabetes or gout. Family history is negative for each of these.   Past Medical History:  Diagnosis Date  . Allergic rhinitis   . Anemia   . Anxiety   . Blood transfusion without reported diagnosis   . Depression   . Ectopic pregnancy   . Finger fracture, left    index  . GERD (gastroesophageal reflux disease)   . Hepatitis C   . Hypertension   . Hypothyroidism   . Seizures ([place])     Past Surgical History:  Procedure Laterality Date  . APPENDECTOMY    . BRAIN SURGERY     x 3  . BREAST ENHANCEMENT SURGERY    . CHOLECYSTECTOMY    . ECTOPIC PREGNANCY SURGERY    . LUMBAR SPINE SURGERY    . TONSILLECTOMY AND ADENOIDECTOMY      Family History  Problem Relation Age of Onset  . Heart failure Mother   . Lung cancer Father   . Alcohol abuse Brother   . Leukemia Brother   . Colon cancer Neg Hx    Social History:  reports that she has been smoking cigarettes. She has a 10.00 pack-year smoking history. She has never used smokeless tobacco. She reports current alcohol use. She reports that she does not use drugs.  Allergies:  Allergies  Allergen Reactions  . Codeine Nausea And Vomiting  . Pegasys [Peginterferon Alfa-2a] Hives    No medications prior to admission.    No results found for this or any previous  visit (from the past 48 hour(s)).  No results found.   Pertinent items are noted in HPI.  Height 5\' 6"  (1.676 m), weight 46.7 kg.  General appearance: alert, cooperative and appears stated age Head: Normocephalic, without obvious abnormality Neck: no JVD Resp: clear to auscultation bilaterally Cardio: regular rate and rhythm, S1, S2 normal, no murmur, click, rub or gallop GI: soft, non-tender; bowel sounds normal; no masses,  no organomegaly Extremities: pain and swlling left index finger Pulses: 2+ and symmetric Skin: Skin color, texture, turgor normal. No rashes or lesions Neurologic: Grossly normal Incision/Wound: na  Assessment/Plan Assessment:  1. Closed displaced fracture of proximal phalanx of left index finger  Plan: She is seen with her cousin. Her notes and x-rays from [facility] are reviewed. We have discussed close reduction percutaneous pinning of this with her. We will open this if absolutely necessary. Prepare postoperative course are discussed along with risk and complications. We will renew her Ultram. She is advised to try Tylenol ibuprofen prior to that. She is advised importance of elevation. Schedule as an outpatient under regional anesthesia. She is not on any blood thinner.    Daryll Brod 07/25/2020, 6:33 AM

## 2020-07-26 ENCOUNTER — Encounter (HOSPITAL_BASED_OUTPATIENT_CLINIC_OR_DEPARTMENT_OTHER): Payer: Self-pay | Admitting: Orthopedic Surgery

## 2020-07-26 NOTE — Anesthesia Postprocedure Evaluation (Signed)
Anesthesia Post Note  Patient: Catherine Gonzalez  Procedure(s) Performed: CLOSED REDUCTION FINGER WITH PERCUTANEOUS PINNING (Left Finger)     Patient location during evaluation: PACU Anesthesia Type: Regional Level of consciousness: awake and alert Pain management: pain level controlled Vital Signs Assessment: post-procedure vital signs reviewed and stable Respiratory status: spontaneous breathing, nonlabored ventilation and respiratory function stable Cardiovascular status: blood pressure returned to baseline and stable Postop Assessment: no apparent nausea or vomiting Anesthetic complications: no   No complications documented.  Last Vitals:  Vitals:   07/25/20 1130 07/25/20 1206  BP: (!) 128/93 133/89  Pulse: 71 80  Resp: 16 16  Temp:  36.8 C  SpO2: 96% 95%    Last Pain:  Vitals:   07/25/20 1206  TempSrc: Oral  PainSc: 0-No pain                 Lynda Rainwater

## 2020-11-19 ENCOUNTER — Ambulatory Visit
Admission: EM | Admit: 2020-11-19 | Discharge: 2020-11-19 | Disposition: A | Discharge status: Home / Self Care | Payer: Medicare Other | Attending: Family Medicine | Admitting: Family Medicine

## 2020-11-19 ENCOUNTER — Other Ambulatory Visit: Payer: Self-pay

## 2020-11-19 ENCOUNTER — Encounter: Payer: Self-pay | Admitting: Emergency Medicine

## 2020-11-19 DIAGNOSIS — J0140 Acute pansinusitis, unspecified: Secondary | ICD-10-CM

## 2020-11-19 DIAGNOSIS — R0981 Nasal congestion: Secondary | ICD-10-CM | POA: Diagnosis not present

## 2020-11-19 MED ORDER — AMOXICILLIN-POT CLAVULANATE 875-125 MG PO TABS: 1.0000 | ORAL_TABLET | Freq: Two times a day (BID) | ORAL | 0 refills | Status: AC

## 2020-11-19 NOTE — ED Triage Notes (Signed)
Head, nasal and chest congestion x 2 weeks

## 2020-11-19 NOTE — ED Provider Notes (Signed)
[facility]   [id number] 11/19/20 Arrival Time: 1237   CC: COVID symptoms  SUBJECTIVE: History from: patient.  Catherine Gonzalez is a 69 y.o. female who presents with abrupt onset of nasal congestion, PND, and persistent dry cough for the last 2 weeks. Reports sinus pressure as well. Has been taking mucinex with little relief. Denies sick exposure to COVID, flu or strep. Denies recent travel. Has negative history of Covid. Has completed Covid vaccines. There are no aggravating or alleviating factors. Denies previous symptoms in the past. Denies fever, chills, fatigue, rhinorrhea, sore throat, SOB, wheezing, chest pain, nausea, changes in bowel or bladder habits.    ROS: As per HPI.  All other pertinent ROS negative.     Past Medical History:  Diagnosis Date  . Allergic rhinitis   . Anemia   . Anxiety   . Blood transfusion without reported diagnosis   . Depression   . Ectopic pregnancy   . Finger fracture, left    index  . GERD (gastroesophageal reflux disease)   . Hepatitis C   . Hypertension   . Hypothyroidism   . Seizures ([place])    Past Surgical History:  Procedure Laterality Date  . APPENDECTOMY    . BRAIN SURGERY     x 3  . BREAST ENHANCEMENT SURGERY    . CHOLECYSTECTOMY    . CLOSED REDUCTION FINGER WITH PERCUTANEOUS PINNING Left 07/25/2020   Procedure: CLOSED REDUCTION FINGER WITH PERCUTANEOUS PINNING;  Surgeon: Daryll Brod, MD;  Location: [facility];  Service: Orthopedics;  Laterality: Left;  AXILLARY BLOCK  . ECTOPIC PREGNANCY SURGERY    . LUMBAR SPINE SURGERY    . TONSILLECTOMY AND ADENOIDECTOMY     Allergies  Allergen Reactions  . Codeine Nausea And Vomiting  . Pegasys [Peginterferon Alfa-2a] Hives   No current facility-administered medications on file prior to encounter.   Current Outpatient Medications on File Prior to Encounter  Medication Sig Dispense Refill  . amLODipine (NORVASC) 5 MG tablet Take 5 mg by mouth daily.    .  fluticasone (FLONASE) 50 MCG/ACT nasal spray Place 2 sprays into both nostrils daily. 11.1 mL 0  . hydrOXYzine (ATARAX/VISTARIL) 25 MG tablet Take 25 mg by mouth 2 (two) times daily as needed.    . levETIRAcetam (KEPPRA) 750 MG tablet TAKE 1 TABLET TWICE A DAY (Patient taking differently: Take 750 mg by mouth 2 (two) times daily. ) 180 tablet 3  . levothyroxine (SYNTHROID) 75 MCG tablet Take 75 mcg by mouth daily before breakfast. TAKE 1 1/2 TABS DAILY    . olmesartan (BENICAR) 20 MG tablet Take 20 mg by mouth daily.    . ondansetron (ZOFRAN ODT) 4 MG disintegrating tablet Take 1 tablet (4 mg total) by mouth every 8 (eight) hours as needed for nausea or vomiting. 20 tablet 0  . sodium bicarbonate 325 MG tablet Take 650 mg by mouth 4 (four) times daily -  with meals and at bedtime.     . traMADol (ULTRAM) 50 MG tablet Take 1 tablet (50 mg total) by mouth 2 (two) times daily. 8 tablet 0  . traMADol (ULTRAM) 50 MG tablet Take 1 tablet (50 mg total) by mouth every 6 (six) hours as needed. 20 tablet 0  . traZODone (DESYREL) 50 MG tablet TAKE 1 TABLET BY MOUTH AT BEDTIME FOR SLEEP     Social History   Socioeconomic History  . Marital status: Single    Spouse name: Not on file  .  Number of children: 0  . Years of education: Not on file  . Highest education level: Not on file  Occupational History  . Not on file  Tobacco Use  . Smoking status: Current Every Day Smoker    Packs/day: 0.25    Years: 40.00    Pack years: 10.00    Types: Cigarettes  . Smokeless tobacco: Never Used  Vaping Use  . Vaping Use: Never used  Substance and Sexual Activity  . Alcohol use: Yes    Comment: OCC  . Drug use: No  . Sexual activity: Never  Other Topics Concern  . Not on file  Social History Narrative  . Not on file   Social Determinants of Health   Financial Resource Strain: Not on file  Food Insecurity: Not on file  Transportation Needs: Not on file  Physical Activity: Not on file  Stress: Not on  file  Social Connections: Not on file  Intimate Partner Violence: Not on file   Family History  Problem Relation Age of Onset  . Heart failure Mother   . Lung cancer Father   . Alcohol abuse Brother   . Leukemia Brother   . Colon cancer Neg Hx     OBJECTIVE:  Vitals:   11/19/20 1318 11/19/20 1319  BP:  (!) 146/75  Pulse:  (!) 112  Resp:  17  Temp:  98 F (36.7 C)  TempSrc:  Oral  SpO2:  93%  Weight: 105 lb (47.6 kg)   Height: 5\' 5"  (1.651 m)      General appearance: alert; appears fatigued, but nontoxic; speaking in full sentences and tolerating own secretions HEENT: NCAT; Ears: EACs clear, TMs pearly gray; Eyes: PERRL.  EOM grossly intact. Sinuses: frontal sinus tenderness; Nose: nares patent without rhinorrhea, Throat: oropharynx mildly erythematous, cobblestoning present, tonsils non erythematous or enlarged, uvula midline  Neck: supple without LAD Lungs: unlabored respirations, symmetrical air entry; cough: absent; no respiratory distress; CTAB Heart: regular rate and rhythm.  Radial pulses 2+ symmetrical bilaterally Skin: warm and dry Psychological: alert and cooperative; normal mood and affect  LABS:  No results found for this or any previous visit (from the past 24 hour(s)).   ASSESSMENT & PLAN:  1. Acute non-recurrent pansinusitis   2. Nasal congestion     Meds ordered this encounter  Medications  . amoxicillin-clavulanate (AUGMENTIN) 875-125 MG tablet    Sig: Take 1 tablet by mouth 2 (two) times daily for 10 days.    Dispense:  20 tablet    Refill:  0    Order Specific Question:   Supervising Provider    Answer:   Chase Picket [id number]  . amoxicillin-clavulanate (AUGMENTIN) 875-125 MG tablet    Sig: Take 1 tablet by mouth 2 (two) times daily for 7 days.    Dispense:  14 tablet    Refill:  0    Order Specific Question:   Supervising Provider    Answer:   Chase Picket [id number]   Prescribed Augmentin BID x 7 days for likely sinusitis  given length of illness and clinical presentation   COVID and flu testing ordered.  It will take between 1-2 days for test results.  Someone will contact you regarding abnormal results.    Patient should remain in quarantine until they have received Covid results.  If negative you may resume normal activities (go back to work/school) while practicing hand hygiene, social distance, and mask wearing.  If positive, patient should remain in quarantine  for 10 days from symptom onset AND greater than 72 hours after symptoms resolution (absence of fever without the use of fever-reducing medication and improvement in respiratory symptoms), whichever is longer Get plenty of rest and push fluids Use OTC zyrtec for nasal congestion, runny nose, and/or sore throat Use OTC flonase for nasal congestion and runny nose Use medications daily for symptom relief Use OTC medications like ibuprofen or tylenol as needed fever or pain Call or go to the ED if you have any new or worsening symptoms such as fever, worsening cough, shortness of breath, chest tightness, chest pain, turning blue, changes in mental status.  Reviewed expectations re: course of current medical issues. Questions answered. Outlined signs and symptoms indicating need for more acute intervention. Patient verbalized understanding. After Visit Summary given.         Faustino Congress, NP 11/19/20 1337

## 2020-11-19 NOTE — Discharge Instructions (Signed)
I have sent in Augmentin for you to take twice a day for 7 days.  Your COVID and Flu tests are pending.  You should self quarantine until the test results are back.    Take Tylenol or ibuprofen as needed for fever or discomfort.  Rest and keep yourself hydrated.    Follow-up with your primary care provider if your symptoms are not improving.    Follow up with this office or with primary care if symptoms are persisting.  Follow up in the ER for high fever, trouble swallowing, trouble breathing, other concerning symptoms.

## 2020-11-22 LAB — COVID-19, FLU A+B NAA
Influenza A, NAA: NOT DETECTED
Influenza B, NAA: NOT DETECTED
SARS-CoV-2, NAA: NOT DETECTED

## 2020-12-29 ENCOUNTER — Encounter: Payer: Self-pay | Admitting: Emergency Medicine

## 2020-12-29 ENCOUNTER — Ambulatory Visit
Admission: EM | Admit: 2020-12-29 | Discharge: 2020-12-29 | Disposition: A | Discharge status: Home / Self Care | Payer: Medicare Other | Attending: Emergency Medicine | Admitting: Emergency Medicine

## 2020-12-29 ENCOUNTER — Other Ambulatory Visit: Payer: Self-pay

## 2020-12-29 DIAGNOSIS — J0101 Acute recurrent maxillary sinusitis: Secondary | ICD-10-CM | POA: Diagnosis not present

## 2020-12-29 DIAGNOSIS — Z1152 Encounter for screening for COVID-19: Secondary | ICD-10-CM | POA: Diagnosis not present

## 2020-12-29 MED ORDER — AMOXICILLIN-POT CLAVULANATE 875-125 MG PO TABS: 1.0000 | ORAL_TABLET | Freq: Two times a day (BID) | ORAL | 0 refills | Status: DC

## 2020-12-29 NOTE — ED Provider Notes (Signed)
[facility]   [id number] 12/29/20 Arrival Time: 3532   CC:URI  SUBJECTIVE: History from: patient and caregiver.  Catherine Gonzalez is a 70 y.o. female who presented to the urgent care for complaint of sinus pressure, sinus pain and headache for the past 1 week.  Denies sick exposure to COVID, flu or strep.  Denies recent travel.  Has tried OTC Flonase without relief.  Denies alleviating or aggravating factors.  Report previous symptoms in the past and was treated with Augmentin with symptom resolution.   Denies fever, chills, fatigue, rhinorrhea, sore throat, SOB, wheezing, chest pain, nausea, changes in bowel or bladder habits.     ROS: As per HPI.  All other pertinent ROS negative.     Past Medical History:  Diagnosis Date  . Allergic rhinitis   . Anemia   . Anxiety   . Blood transfusion without reported diagnosis   . Depression   . Ectopic pregnancy   . Finger fracture, left    index  . GERD (gastroesophageal reflux disease)   . Hepatitis C   . Hypertension   . Hypothyroidism   . Seizures ([place])    Past Surgical History:  Procedure Laterality Date  . APPENDECTOMY    . BRAIN SURGERY     x 3  . BREAST ENHANCEMENT SURGERY    . CHOLECYSTECTOMY    . CLOSED REDUCTION FINGER WITH PERCUTANEOUS PINNING Left 07/25/2020   Procedure: CLOSED REDUCTION FINGER WITH PERCUTANEOUS PINNING;  Surgeon: Daryll Brod, MD;  Location: [facility];  Service: Orthopedics;  Laterality: Left;  AXILLARY BLOCK  . ECTOPIC PREGNANCY SURGERY    . LUMBAR SPINE SURGERY    . TONSILLECTOMY AND ADENOIDECTOMY     Allergies  Allergen Reactions  . Codeine Nausea And Vomiting  . Pegasys [Peginterferon Alfa-2a] Hives   No current facility-administered medications on file prior to encounter.   Current Outpatient Medications on File Prior to Encounter  Medication Sig Dispense Refill  . amLODipine (NORVASC) 5 MG tablet Take 5 mg by mouth daily.    . fluticasone (FLONASE) 50 MCG/ACT  nasal spray Place 2 sprays into both nostrils daily. 11.1 mL 0  . hydrOXYzine (ATARAX/VISTARIL) 25 MG tablet Take 25 mg by mouth 2 (two) times daily as needed.    . levETIRAcetam (KEPPRA) 750 MG tablet TAKE 1 TABLET TWICE A DAY (Patient taking differently: Take 750 mg by mouth 2 (two) times daily. ) 180 tablet 3  . levothyroxine (SYNTHROID) 75 MCG tablet Take 75 mcg by mouth daily before breakfast. TAKE 1 1/2 TABS DAILY    . olmesartan (BENICAR) 20 MG tablet Take 20 mg by mouth daily.    . ondansetron (ZOFRAN ODT) 4 MG disintegrating tablet Take 1 tablet (4 mg total) by mouth every 8 (eight) hours as needed for nausea or vomiting. 20 tablet 0  . sodium bicarbonate 325 MG tablet Take 650 mg by mouth 4 (four) times daily -  with meals and at bedtime.     . traMADol (ULTRAM) 50 MG tablet Take 1 tablet (50 mg total) by mouth 2 (two) times daily. 8 tablet 0  . traMADol (ULTRAM) 50 MG tablet Take 1 tablet (50 mg total) by mouth every 6 (six) hours as needed. 20 tablet 0  . traZODone (DESYREL) 50 MG tablet TAKE 1 TABLET BY MOUTH AT BEDTIME FOR SLEEP     Social History   Socioeconomic History  . Marital status: Single    Spouse name: Not on file  .  Number of children: 0  . Years of education: Not on file  . Highest education level: Not on file  Occupational History  . Not on file  Tobacco Use  . Smoking status: Current Every Day Smoker    Packs/day: 0.25    Years: 40.00    Pack years: 10.00    Types: Cigarettes  . Smokeless tobacco: Never Used  Vaping Use  . Vaping Use: Never used  Substance and Sexual Activity  . Alcohol use: Yes    Comment: OCC  . Drug use: No  . Sexual activity: Never  Other Topics Concern  . Not on file  Social History Narrative  . Not on file   Social Determinants of Health   Financial Resource Strain: Not on file  Food Insecurity: Not on file  Transportation Needs: Not on file  Physical Activity: Not on file  Stress: Not on file  Social Connections: Not  on file  Intimate Partner Violence: Not on file   Family History  Problem Relation Age of Onset  . Heart failure Mother   . Lung cancer Father   . Alcohol abuse Brother   . Leukemia Brother   . Colon cancer Neg Hx     OBJECTIVE:  Vitals:   12/29/20 1308 12/29/20 1309  BP:  (!) 157/79  Pulse:  99  Resp:  18  Temp:  98.3 F (36.8 C)  TempSrc:  Oral  SpO2:  93%  Weight: 103 lb (46.7 kg)      General appearance: alert; appears fatigued, but nontoxic; speaking in full sentences and tolerating own secretions HEENT: NCAT; Ears: EACs clear, TMs pearly gray; Eyes: PERRL.  EOM grossly intact. Sinuses: Maxillary sinus tender; Nose: nares patent without rhinorrhea, Throat: oropharynx clear, tonsils non erythematous or enlarged, uvula midline  Neck: supple without LAD Lungs: unlabored respirations, symmetrical air entry; cough: mild; no respiratory distress; CTAB Heart: regular rate and rhythm.  Radial pulses 2+ symmetrical bilaterally Skin: warm and dry Psychological: alert and cooperative; normal mood and affect  LABS:  No results found for this or any previous visit (from the past 24 hour(s)).   ASSESSMENT & PLAN:  1. Acute recurrent maxillary sinusitis   2. Encounter for screening for COVID-19     Meds ordered this encounter  Medications  . amoxicillin-clavulanate (AUGMENTIN) 875-125 MG tablet    Sig: Take 1 tablet by mouth every 12 (twelve) hours.    Dispense:  14 tablet    Refill:  0    Discharge Instructions  COVID-19 test will take 2 to 7 days for results to return.  Someone will call if your result is abnormal. Get plenty of rest and push fluids Augmentin was prescribed for sinusitis/take as directed Continue flonase for nasal congestion and runny nose Use medications daily for symptom relief Use OTC medications like ibuprofen or tylenol as needed fever or pain Call or go to the ED if you have any new or worsening symptoms such as fever, worsening cough,  shortness of breath, chest tightness, chest pain, turning blue, changes in mental status, etc...   Reviewed expectations re: course of current medical issues. Questions answered. Outlined signs and symptoms indicating need for more acute intervention. Patient verbalized understanding. After Visit Summary given.      Emerson Monte, FNP 12/29/20 1340

## 2020-12-29 NOTE — Discharge Instructions (Addendum)
COVID-19 test will take 2 to 7 days for results to return.  Someone will call if your result is abnormal. Get plenty of rest and push fluids Augmentin was prescribed for sinusitis/take as directed Continue flonase for nasal congestion and runny nose Use medications daily for symptom relief Use OTC medications like ibuprofen or tylenol as needed fever or pain Call or go to the ED if you have any new or worsening symptoms such as fever, worsening cough, shortness of breath, chest tightness, chest pain, turning blue, changes in mental status, etc..Marland Kitchen

## 2020-12-29 NOTE — ED Triage Notes (Signed)
Sinus congestion and headache x 1 week ago. Chest congestion for past 3 days that has gotten better with some cold pills she has been taking.  States her chest feels better now.

## 2021-01-01 LAB — COVID-19, FLU A+B NAA
Influenza A, NAA: NOT DETECTED
Influenza B, NAA: NOT DETECTED
SARS-CoV-2, NAA: NOT DETECTED

## 2021-02-12 DIAGNOSIS — K219 Gastro-esophageal reflux disease without esophagitis: Secondary | ICD-10-CM | POA: Diagnosis not present

## 2021-02-12 DIAGNOSIS — E039 Hypothyroidism, unspecified: Secondary | ICD-10-CM | POA: Diagnosis not present

## 2021-02-12 DIAGNOSIS — F411 Generalized anxiety disorder: Secondary | ICD-10-CM | POA: Diagnosis not present

## 2021-02-12 DIAGNOSIS — I10 Essential (primary) hypertension: Secondary | ICD-10-CM | POA: Diagnosis not present

## 2021-02-12 DIAGNOSIS — J302 Other seasonal allergic rhinitis: Secondary | ICD-10-CM | POA: Diagnosis not present

## 2021-02-12 DIAGNOSIS — Z86011 Personal history of benign neoplasm of the brain: Secondary | ICD-10-CM | POA: Diagnosis not present

## 2021-02-12 DIAGNOSIS — Z8669 Personal history of other diseases of the nervous system and sense organs: Secondary | ICD-10-CM | POA: Diagnosis not present

## 2021-02-12 DIAGNOSIS — F5101 Primary insomnia: Secondary | ICD-10-CM | POA: Diagnosis not present

## 2021-02-12 DIAGNOSIS — Z0189 Encounter for other specified special examinations: Secondary | ICD-10-CM | POA: Diagnosis not present

## 2021-03-07 DIAGNOSIS — K219 Gastro-esophageal reflux disease without esophagitis: Secondary | ICD-10-CM | POA: Diagnosis not present

## 2021-03-07 DIAGNOSIS — D72828 Other elevated white blood cell count: Secondary | ICD-10-CM | POA: Diagnosis not present

## 2021-03-07 DIAGNOSIS — Z86011 Personal history of benign neoplasm of the brain: Secondary | ICD-10-CM | POA: Diagnosis not present

## 2021-03-07 DIAGNOSIS — Z8669 Personal history of other diseases of the nervous system and sense organs: Secondary | ICD-10-CM | POA: Diagnosis not present

## 2021-03-07 DIAGNOSIS — F411 Generalized anxiety disorder: Secondary | ICD-10-CM | POA: Diagnosis not present

## 2021-03-07 DIAGNOSIS — I10 Essential (primary) hypertension: Secondary | ICD-10-CM | POA: Diagnosis not present

## 2021-03-07 DIAGNOSIS — E039 Hypothyroidism, unspecified: Secondary | ICD-10-CM | POA: Diagnosis not present

## 2021-03-07 DIAGNOSIS — E782 Mixed hyperlipidemia: Secondary | ICD-10-CM | POA: Diagnosis not present

## 2021-03-07 DIAGNOSIS — J302 Other seasonal allergic rhinitis: Secondary | ICD-10-CM | POA: Diagnosis not present

## 2021-03-07 DIAGNOSIS — F5101 Primary insomnia: Secondary | ICD-10-CM | POA: Diagnosis not present

## 2021-03-07 DIAGNOSIS — E878 Other disorders of electrolyte and fluid balance, not elsewhere classified: Secondary | ICD-10-CM | POA: Diagnosis not present

## 2021-03-31 DIAGNOSIS — J0110 Acute frontal sinusitis, unspecified: Secondary | ICD-10-CM | POA: Diagnosis not present

## 2021-06-10 ENCOUNTER — Encounter: Payer: Self-pay | Admitting: Emergency Medicine

## 2021-06-10 ENCOUNTER — Ambulatory Visit
Admission: EM | Admit: 2021-06-10 | Discharge: 2021-06-10 | Disposition: A | Discharge status: Home / Self Care | Payer: Medicare Other | Attending: Family Medicine | Admitting: Family Medicine

## 2021-06-10 DIAGNOSIS — Z1152 Encounter for screening for COVID-19: Secondary | ICD-10-CM | POA: Diagnosis not present

## 2021-06-10 DIAGNOSIS — J0110 Acute frontal sinusitis, unspecified: Secondary | ICD-10-CM | POA: Diagnosis not present

## 2021-06-10 MED ORDER — AMOXICILLIN-POT CLAVULANATE 875-125 MG PO TABS: 1.0000 | ORAL_TABLET | Freq: Two times a day (BID) | ORAL | 0 refills | Status: AC

## 2021-06-10 MED ORDER — PREDNISONE 20 MG PO TABS: 20.0000 mg | ORAL_TABLET | Freq: Every day | ORAL | 0 refills | Status: AC

## 2021-06-10 NOTE — Discharge Instructions (Addendum)
Your COVID 19 results should result within 2-4 days. Negative results are immediately resulted to Mychart. Positive results will receive a follow-up call from our clinic. If symptoms are present, I recommend home quarantine until results are known.  Alternate Tylenol and ibuprofen as needed for body aches and fever.  Symptom management per recommendations discussed today.  If any breathing difficulty or chest pain develops go immediately to the closest emergency department for evaluation.

## 2021-06-10 NOTE — ED Triage Notes (Signed)
Pt states she thinks she has a sinus infection. Headache and nasal congestion x 3 days

## 2021-06-10 NOTE — ED Provider Notes (Signed)
[facility]    CSN: [id number] Arrival date & time: 06/10/21  [place]      History   Chief Complaint Chief Complaint  Patient presents with   Sinus Problem    HPI Catherine Gonzalez is a 70 y.o. female.   HPI Patient presents with URI symptoms including headache,nasal congestion, and congestion.  Onset x 3 days.Denies worrisome symptoms of shortness of breath, weakness, N&V, and chest pain. Past Medical History:  Diagnosis Date   Allergic rhinitis    Anemia    Anxiety    Blood transfusion without reported diagnosis    Depression    Ectopic pregnancy    Finger fracture, left    index   GERD (gastroesophageal reflux disease)    Hepatitis C    Hypertension    Hypothyroidism    Seizures ([place])     Patient Active Problem List   Diagnosis Date Noted   Anemia 05/08/2013   Unspecified constipation 05/08/2013   SIADH (syndrome of inappropriate ADH production) ([place]) 05/08/2013   Syncope and collapse 03/20/2013   Hyponatremia 03/20/2013   Hypokalemia 03/20/2013   Hep C w/o coma, chronic ([place]) 03/08/2013   Hypertension     Past Surgical History:  Procedure Laterality Date   APPENDECTOMY     BRAIN SURGERY     x 3   BREAST ENHANCEMENT SURGERY     CHOLECYSTECTOMY     CLOSED REDUCTION FINGER WITH PERCUTANEOUS PINNING Left 07/25/2020   Procedure: CLOSED REDUCTION FINGER WITH PERCUTANEOUS PINNING;  Surgeon: Daryll Brod, MD;  Location: [facility];  Service: Orthopedics;  Laterality: Left;  AXILLARY BLOCK   ECTOPIC PREGNANCY SURGERY     LUMBAR SPINE SURGERY     TONSILLECTOMY AND ADENOIDECTOMY      OB History     Gravida  1   Para      Term      Preterm      AB  1   Living         SAB      IAB      Ectopic  1   Multiple      Live Births               Home Medications    Prior to Admission medications   Medication Sig Start Date End Date Taking? Authorizing Provider  predniSONE (DELTASONE) 20 MG tablet Take 1  tablet (20 mg total) by mouth daily with breakfast for 5 days. 06/10/21 06/15/21 Yes Scot Jun, FNP  amLODipine (NORVASC) 5 MG tablet Take 5 mg by mouth daily.    [provider]  amoxicillin-clavulanate (AUGMENTIN) 875-125 MG tablet Take 1 tablet by mouth every 12 (twelve) hours for 7 days. 06/10/21 06/17/21  Scot Jun, FNP  fluticasone (FLONASE) 50 MCG/ACT nasal spray Place 2 sprays into both nostrils daily. 05/18/20   Henderly, Britni A, PA-C  hydrOXYzine (ATARAX/VISTARIL) 25 MG tablet Take 25 mg by mouth 2 (two) times daily as needed. 06/05/20   [provider]  levETIRAcetam (KEPPRA) 750 MG tablet TAKE 1 TABLET TWICE A DAY Patient taking differently: Take 750 mg by mouth 2 (two) times daily.  04/11/14   Susy Frizzle, MD  levothyroxine (SYNTHROID) 75 MCG tablet Take 75 mcg by mouth daily before breakfast. TAKE 1 1/2 TABS DAILY    [provider]  olmesartan (BENICAR) 20 MG tablet Take 20 mg by mouth daily.    [provider]  ondansetron (ZOFRAN ODT)  4 MG disintegrating tablet Take 1 tablet (4 mg total) by mouth every 8 (eight) hours as needed for nausea or vomiting. 05/18/20   Henderly, Britni A, PA-C  sodium bicarbonate 325 MG tablet Take 650 mg by mouth 4 (four) times daily -  with meals and at bedtime.     [provider]  traMADol (ULTRAM) 50 MG tablet Take 1 tablet (50 mg total) by mouth 2 (two) times daily. 07/17/20   Avegno, Darrelyn Hillock, FNP  traMADol (ULTRAM) 50 MG tablet Take 1 tablet (50 mg total) by mouth every 6 (six) hours as needed. 07/25/20   Daryll Brod, MD  traZODone (DESYREL) 50 MG tablet TAKE 1 TABLET BY MOUTH AT BEDTIME FOR SLEEP 06/25/20   [provider]    Family History Family History  Problem Relation Age of Onset   Heart failure Mother    Lung cancer Father    Alcohol abuse Brother    Leukemia Brother    Colon cancer Neg Hx     Social History Social History   Tobacco Use   Smoking status:  Every Day    Packs/day: 0.25    Years: 40.00    Pack years: 10.00    Types: Cigarettes   Smokeless tobacco: Never  Vaping Use   Vaping Use: Never used  Substance Use Topics   Alcohol use: Yes    Comment: OCC   Drug use: No     Allergies   Codeine and Pegasys [peginterferon alfa-2a]   Review of Systems Review of Systems Pertinent negatives listed in HPI   Physical Exam Triage Vital Signs ED Triage Vitals [06/10/21 1944]  Enc Vitals Group     BP      Pulse      Resp      Temp      Temp src      SpO2      Weight      Height      Head Circumference      Peak Flow      Pain Score 6     Pain Loc      Pain Edu?      Excl. in [place]?    No data found.  Updated Vital Signs There were no vitals taken for this visit.  Visual Acuity Right Eye Distance:   Left Eye Distance:   Bilateral Distance:    Right Eye Near:   Left Eye Near:    Bilateral Near:     Physical Exam  General Appearance:    Alert, cooperative, no distress  HENT:   Normocephalic, ears normal, nares mucosal edema with congestion, rhinorrhea, oropharynx w/o exudate   Eyes:    PERRL, conjunctiva/corneas clear, EOM's intact       Lungs:     Clear to auscultation bilaterally, respirations unlabored  Heart:    Regular rate and rhythm  Neurologic:   Awake, alert, oriented x 3. No apparent focal neurological           defect.      UC Treatments / Results  Labs (all labs ordered are listed, but only abnormal results are displayed) Labs Reviewed  COVID-19, FLU A+B NAA   Narrative:    Performed at:  [address], [place], [place]  [id number] Lab Director: Rush Farmer MD, Phone:  [phone]    EKG   Radiology No results found.  Procedures Procedures (including critical care time)  Medications Ordered in UC Medications -  No data to display  Initial Impression / Assessment and Plan / UC Course  I have reviewed the triage vital signs and the nursing  notes.  Pertinent labs & imaging results that were available during my care of the patient were reviewed by me and considered in my medical decision making (see chart for details).    Treatment for Acute Sinusitis per discharge medications orders. COVID/Flu test pending. Symptom management warranted only.  Manage fever with Tylenol and ibuprofen.  Nasal symptoms with over-the-counter antihistamines recommended.  Treatment per discharge medications/discharge instructions.  Red flags/ER precautions given. The most current CDC isolation/quarantine recommendation advised.  Final Clinical Impressions(s) / UC Diagnoses   Final diagnoses:  Acute non-recurrent frontal sinusitis  Encounter for screening for COVID-19     Discharge Instructions      Your COVID 19 results should result within 2-4 days. Negative results are immediately resulted to Mychart. Positive results will receive a follow-up call from our clinic. If symptoms are present, I recommend home quarantine until results are known.  Alternate Tylenol and ibuprofen as needed for body aches and fever.  Symptom management per recommendations discussed today.  If any breathing difficulty or chest pain develops go immediately to the closest emergency department for evaluation.      ED Prescriptions     Medication Sig Dispense Auth. Provider   amoxicillin-clavulanate (AUGMENTIN) 875-125 MG tablet Take 1 tablet by mouth every 12 (twelve) hours for 7 days. 14 tablet Scot Jun, FNP   predniSONE (DELTASONE) 20 MG tablet Take 1 tablet (20 mg total) by mouth daily with breakfast for 5 days. 5 tablet Scot Jun, FNP      PDMP not reviewed this encounter.   Scot Jun, FNP 06/14/21 [phone]

## 2021-06-11 LAB — COVID-19, FLU A+B NAA
Influenza A, NAA: NOT DETECTED
Influenza B, NAA: NOT DETECTED
SARS-CoV-2, NAA: NOT DETECTED

## 2021-07-10 DIAGNOSIS — F411 Generalized anxiety disorder: Secondary | ICD-10-CM | POA: Insufficient documentation

## 2021-07-10 DIAGNOSIS — F5101 Primary insomnia: Secondary | ICD-10-CM | POA: Insufficient documentation

## 2021-07-10 DIAGNOSIS — R270 Ataxia, unspecified: Secondary | ICD-10-CM | POA: Insufficient documentation

## 2021-07-10 DIAGNOSIS — K219 Gastro-esophageal reflux disease without esophagitis: Secondary | ICD-10-CM | POA: Insufficient documentation

## 2021-07-10 DIAGNOSIS — E782 Mixed hyperlipidemia: Secondary | ICD-10-CM | POA: Insufficient documentation

## 2021-07-10 DIAGNOSIS — E878 Other disorders of electrolyte and fluid balance, not elsewhere classified: Secondary | ICD-10-CM | POA: Insufficient documentation

## 2021-07-29 ENCOUNTER — Other Ambulatory Visit: Payer: Self-pay

## 2021-07-29 ENCOUNTER — Emergency Department (HOSPITAL_COMMUNITY)
Admission: EM | Admit: 2021-07-29 | Discharge: 2021-07-29 | Disposition: A | Discharge status: Home / Self Care | Payer: Medicare Other | Attending: Emergency Medicine | Admitting: Emergency Medicine

## 2021-07-29 ENCOUNTER — Encounter (HOSPITAL_COMMUNITY): Payer: Self-pay | Admitting: *Deleted

## 2021-07-29 ENCOUNTER — Emergency Department (HOSPITAL_COMMUNITY): Payer: Medicare Other

## 2021-07-29 DIAGNOSIS — R55 Syncope and collapse: Secondary | ICD-10-CM | POA: Diagnosis not present

## 2021-07-29 DIAGNOSIS — R42 Dizziness and giddiness: Secondary | ICD-10-CM | POA: Diagnosis present

## 2021-07-29 DIAGNOSIS — I10 Essential (primary) hypertension: Secondary | ICD-10-CM | POA: Diagnosis not present

## 2021-07-29 DIAGNOSIS — E039 Hypothyroidism, unspecified: Secondary | ICD-10-CM | POA: Insufficient documentation

## 2021-07-29 DIAGNOSIS — F17210 Nicotine dependence, cigarettes, uncomplicated: Secondary | ICD-10-CM | POA: Diagnosis not present

## 2021-07-29 DIAGNOSIS — Z79899 Other long term (current) drug therapy: Secondary | ICD-10-CM | POA: Insufficient documentation

## 2021-07-29 LAB — BASIC METABOLIC PANEL
Anion gap: 10 (ref 5–15)
BUN: 18 mg/dL (ref 8–23)
CO2: 28 mmol/L (ref 22–32)
Calcium: 9.4 mg/dL (ref 8.9–10.3)
Chloride: 96 mmol/L — ABNORMAL LOW (ref 98–111)
Creatinine, Ser: 0.86 mg/dL (ref 0.44–1.00)
GFR, Estimated: >60 mL/min (ref >60)
Glucose, Bld: 114 mg/dL — ABNORMAL HIGH (ref 70–99)
Potassium: 4 mmol/L (ref 3.5–5.1)
Sodium: 134 mmol/L — ABNORMAL LOW (ref 135–145)

## 2021-07-29 LAB — CBC
HCT: 40.8 % (ref 36.0–46.0)
Hemoglobin: 14 g/dL (ref 12.0–15.0)
MCH: 32.7 pg (ref 26.0–34.0)
MCHC: 34.3 g/dL (ref 30.0–36.0)
MCV: 95.3 fL (ref 80.0–100.0)
Platelets: 314 10*3/uL (ref 150–400)
RBC: 4.28 MIL/uL (ref 3.87–5.11)
RDW: 12.8 % (ref 11.5–15.5)
WBC: 11.6 10*3/uL — ABNORMAL HIGH (ref 4.0–10.5)
nRBC: 0 % (ref 0.0–0.2)

## 2021-07-29 LAB — HEPATIC FUNCTION PANEL
ALT: 17 U/L (ref 0–44)
AST: 25 U/L (ref 15–41)
Albumin: 5 g/dL (ref 3.5–5.0)
Alkaline Phosphatase: 68 U/L (ref 38–126)
Bilirubin, Direct: <0.1 mg/dL (ref 0.0–0.2)
Total Bilirubin: 0.3 mg/dL (ref 0.3–1.2)
Total Protein: 7.8 g/dL (ref 6.5–8.1)

## 2021-07-29 MED ORDER — MECLIZINE HCL 12.5 MG PO TABS
25.0000 mg | ORAL_TABLET | Freq: Once | ORAL | Status: AC
  Administered 2021-07-29: 25 mg via ORAL
  Filled 2021-07-29: qty 2

## 2021-07-29 MED ORDER — LACTATED RINGERS IV BOLUS: 1000.0000 mL | Freq: Once | INTRAVENOUS | Status: DC

## 2021-07-29 MED ORDER — LACTATED RINGERS IV BOLUS
500.0000 mL | Freq: Once | INTRAVENOUS | Status: AC
  Administered 2021-07-29: 500 mL via INTRAVENOUS

## 2021-07-29 MED ORDER — MECLIZINE HCL 25 MG PO TABS: 25.0000 mg | ORAL_TABLET | Freq: Three times a day (TID) | ORAL | 0 refills | Status: DC | PRN

## 2021-07-29 NOTE — Discharge Instructions (Addendum)
If you develop new or worsening dizziness, passing out, severe headache, chest pain, or any other new/concerning symptoms the return to the ER for evaluation.

## 2021-07-29 NOTE — ED Provider Notes (Signed)
[facility] EMERGENCY DEPARTMENT Provider Note   CSN: [id number] Arrival date & time: 07/29/21  1345     History Chief Complaint  Patient presents with   Near Syncope   Dizziness    Catherine Gonzalez is a 70 y.o. female.  HPI 70 year old female presents with dizziness and passing out. She has felt well until today. At around 10:30 am she was washing her hair in the sink and felt acutely dizzy/lightheaded for a few seconds and passed out. Scraped her elbow. Did not hit her head. Went to the bathroom about 1 hour later and while on the toilet again felt lightheaded/dizzy and fell off the commode but didn't pass out. She has chronic left sided weakness from prior brain mass that was excised. No headaches or vision complaints. No chest pain/dyspnea.   Past Medical History:  Diagnosis Date   Allergic rhinitis    Anemia    Anxiety    Blood transfusion without reported diagnosis    Depression    Ectopic pregnancy    Finger fracture, left    index   GERD (gastroesophageal reflux disease)    Hepatitis C    Hypertension    Hypothyroidism    Seizures ([place])     Patient Active Problem List   Diagnosis Date Noted   Anemia 05/08/2013   Unspecified constipation 05/08/2013   SIADH (syndrome of inappropriate ADH production) ([place]) 05/08/2013   Syncope and collapse 03/20/2013   Hyponatremia 03/20/2013   Hypokalemia 03/20/2013   Hep C w/o coma, chronic ([place]) 03/08/2013   Hypertension     Past Surgical History:  Procedure Laterality Date   APPENDECTOMY     BRAIN SURGERY     x 3   BREAST ENHANCEMENT SURGERY     CHOLECYSTECTOMY     CLOSED REDUCTION FINGER WITH PERCUTANEOUS PINNING Left 07/25/2020   Procedure: CLOSED REDUCTION FINGER WITH PERCUTANEOUS PINNING;  Surgeon: Daryll Brod, MD;  Location: [place];  Service: Orthopedics;  Laterality: Left;  AXILLARY BLOCK   ECTOPIC PREGNANCY SURGERY     LUMBAR SPINE SURGERY     TONSILLECTOMY AND ADENOIDECTOMY       OB  History     Gravida  1   Para      Term      Preterm      AB  1   Living         SAB      IAB      Ectopic  1   Multiple      Live Births              Family History  Problem Relation Age of Onset   Heart failure Mother    Lung cancer Father    Alcohol abuse Brother    Leukemia Brother    Colon cancer Neg Hx     Social History   Tobacco Use   Smoking status: Every Day    Packs/day: 0.25    Years: 40.00    Pack years: 10.00    Types: Cigarettes   Smokeless tobacco: Never  Vaping Use   Vaping Use: Never used  Substance Use Topics   Alcohol use: Yes    Comment: OCC   Drug use: No    Home Medications Prior to Admission medications   Medication Sig Start Date End Date Taking? Authorizing Provider  meclizine (ANTIVERT) 25 MG tablet Take 1 tablet (25 mg total) by mouth 3 (three) times daily as  needed for dizziness. 07/29/21  Yes Sherwood Gambler, MD  amLODipine (NORVASC) 5 MG tablet Take 5 mg by mouth daily.    [provider]  fluticasone (FLONASE) 50 MCG/ACT nasal spray Place 2 sprays into both nostrils daily. 05/18/20   Henderly, Britni A, PA-C  hydrOXYzine (ATARAX/VISTARIL) 25 MG tablet Take 25 mg by mouth 2 (two) times daily as needed. 06/05/20   [provider]  levETIRAcetam (KEPPRA) 750 MG tablet TAKE 1 TABLET TWICE A DAY Patient taking differently: Take 750 mg by mouth 2 (two) times daily.  04/11/14   Susy Frizzle, MD  levothyroxine (SYNTHROID) 75 MCG tablet Take 75 mcg by mouth daily before breakfast. TAKE 1 1/2 TABS DAILY    [provider]  olmesartan (BENICAR) 20 MG tablet Take 20 mg by mouth daily.    [provider]  ondansetron (ZOFRAN ODT) 4 MG disintegrating tablet Take 1 tablet (4 mg total) by mouth every 8 (eight) hours as needed for nausea or vomiting. 05/18/20   Henderly, Britni A, PA-C  sodium bicarbonate 325 MG tablet Take 650 mg by mouth 4 (four) times daily -  with meals and at bedtime.      [provider]  traMADol (ULTRAM) 50 MG tablet Take 1 tablet (50 mg total) by mouth 2 (two) times daily. 07/17/20   Avegno, Darrelyn Hillock, FNP  traMADol (ULTRAM) 50 MG tablet Take 1 tablet (50 mg total) by mouth every 6 (six) hours as needed. 07/25/20   Daryll Brod, MD  traZODone (DESYREL) 50 MG tablet TAKE 1 TABLET BY MOUTH AT BEDTIME FOR SLEEP 06/25/20   [provider]    Allergies    Codeine and Pegasys [peginterferon alfa-2a]  Review of Systems   Review of Systems  Constitutional:  Negative for fever.  Eyes:  Negative for visual disturbance.  Respiratory:  Negative for shortness of breath.   Cardiovascular:  Negative for chest pain.  Neurological:  Positive for dizziness, syncope and light-headedness. Negative for headaches.  All other systems reviewed and are negative.  Physical Exam Updated Vital Signs BP (!) 170/71   Pulse 89   Temp 99 F (37.2 C) (Oral)   Resp 15   SpO2 99%   Physical Exam Vitals and nursing note reviewed.  Constitutional:      General: She is not in acute distress.    Appearance: She is well-developed. She is not ill-appearing or diaphoretic.  HENT:     Head: Normocephalic and atraumatic.     Right Ear: External ear normal.     Left Ear: External ear normal.     Nose: Nose normal.     Mouth/Throat:     Mouth: Mucous membranes are dry.  Eyes:     General:        Right eye: No discharge.        Left eye: No discharge.     Extraocular Movements: Extraocular movements intact.     Pupils: Pupils are equal, round, and reactive to light.  Cardiovascular:     Rate and Rhythm: Normal rate and regular rhythm.     Heart sounds: Normal heart sounds.  Pulmonary:     Effort: Pulmonary effort is normal.     Breath sounds: Normal breath sounds.  Abdominal:     Palpations: Abdomen is soft.     Tenderness: There is no abdominal tenderness.  Skin:    General: Skin is warm and dry.  Neurological:     Mental Status: She is  alert.      Comments: CN 3-12 grossly intact. 5/5 strength in both right sided extremities. Mild weakness appreciated in left extremities. Grossly normal sensation. Normal finger to nose on right side. Is able to do left but with some extra care/difficulty. She can get up and needs to hold on to me to walk (usually uses a cane). After a few steps she felt acutely dizzy.   Psychiatric:        Mood and Affect: Mood is not anxious.    ED Results / Procedures / Treatments   Labs (all labs ordered are listed, but only abnormal results are displayed) Labs Reviewed  BASIC METABOLIC PANEL - Abnormal; Notable for the following components:      Result Value   Sodium 134 (*)    Chloride 96 (*)    Glucose, Bld 114 (*)    All other components within normal limits  CBC - Abnormal; Notable for the following components:   WBC 11.6 (*)    All other components within normal limits  HEPATIC FUNCTION PANEL    EKG EKG Interpretation  Date/Time:  Tuesday July 29 2021 14:48:22 EDT Ventricular Rate:  89 PR Interval:  122 QRS Duration: 88 QT Interval:  374 QTC Calculation: 455 R Axis:   91 Text Interpretation: Normal sinus rhythm Possible Left atrial enlargement Rightward axis Pulmonary disease pattern Nonspecific ST abnormality Confirmed by Sherwood Gambler [phone]) on 07/29/2021 3:24:13 PM  Radiology CT HEAD WO CONTRAST (5MM)  Result Date: 07/29/2021 CLINICAL DATA:  Dizziness, non-specific EXAM: CT HEAD WITHOUT CONTRAST TECHNIQUE: Contiguous axial images were obtained from the base of the skull through the vertex without intravenous contrast. COMPARISON:  May 18, 2020. FINDINGS: Brain: No evidence of acute infarction, hemorrhage, hydrocephalus, extra-axial collection or mass lesion/mass effect. Similar large area of encephalomalacia in the parietal and occipital lobes. Streak artifact from surgical clips in this region limits evaluation. Vascular: No hyperdense vessel identified. Calcific intracranial  atherosclerosis. Skull: No acute fracture. Prior right parietal craniectomy and cranioplasty. Sinuses/Orbits: Visualized sinuses are clear. Other: Clear mastoid air cells. IMPRESSION: 1. No evidence of acute intracranial abnormality. 2. Prior right parietal craniectomy and cranioplasty with similar parieto-occipital encephalomalacia. Electronically Signed   By: Margaretha Sheffield M.D.   On: 07/29/2021 17:47    Procedures Procedures   Medications Ordered in ED Medications  meclizine (ANTIVERT) tablet 25 mg (25 mg Oral Given 07/29/21 1619)  lactated ringers bolus 500 mL (0 mLs Intravenous Stopped 07/29/21 1741)    ED Course  I have reviewed the triage vital signs and the nursing notes.  Pertinent labs & imaging results that were available during my care of the patient were reviewed by me and considered in my medical decision making (see chart for details).    MDM Rules/Calculators/A&P                           Patient endorses that she passed out earlier today but when she stands up to walk it feels like she is off balance more like a vertigo.  Given her prior brain history I ordered an MRI but apparently she cannot get this due to surgical clips.  CT was obtained and is unremarkable.  After Antivert and fluids she is actually feeling a lot better and feels well enough for discharge.  I did offer admission due to the syncopal part but she declines.  No significant electrolyte disturbance.  Given return precautions. Final Clinical Impression(s) /  ED Diagnoses Final diagnoses:  Dizziness    Rx / DC Orders ED Discharge Orders          Ordered    meclizine (ANTIVERT) 25 MG tablet  3 times daily PRN        07/29/21 1827             Sherwood Gambler, MD 07/29/21 2355

## 2021-07-29 NOTE — ED Triage Notes (Signed)
Syncopal episode x 2 today

## 2021-07-29 NOTE — ED Notes (Signed)
Pt returned from CT °

## 2021-07-29 NOTE — ED Notes (Signed)
Patient transported to CT 

## 2021-08-07 DIAGNOSIS — R42 Dizziness and giddiness: Secondary | ICD-10-CM | POA: Insufficient documentation

## 2021-08-09 DIAGNOSIS — H9202 Otalgia, left ear: Secondary | ICD-10-CM | POA: Insufficient documentation

## 2021-08-21 ENCOUNTER — Ambulatory Visit
Admission: EM | Admit: 2021-08-21 | Discharge: 2021-08-21 | Disposition: A | Discharge status: Home / Self Care | Payer: Medicare Other | Attending: Emergency Medicine | Admitting: Emergency Medicine

## 2021-08-21 ENCOUNTER — Encounter: Payer: Self-pay | Admitting: Emergency Medicine

## 2021-08-21 ENCOUNTER — Other Ambulatory Visit: Payer: Self-pay

## 2021-08-21 DIAGNOSIS — Z20822 Contact with and (suspected) exposure to covid-19: Secondary | ICD-10-CM | POA: Diagnosis present

## 2021-08-21 DIAGNOSIS — R300 Dysuria: Secondary | ICD-10-CM | POA: Insufficient documentation

## 2021-08-21 LAB — POCT URINALYSIS DIP (MANUAL ENTRY)
Bilirubin, UA: NEGATIVE
Glucose, UA: 100 mg/dL — AB
Leukocytes, UA: NEGATIVE
Nitrite, UA: POSITIVE — AB
Protein Ur, POC: 30 mg/dL — AB
Spec Grav, UA: 1.015 (ref 1.010–1.025)
Urobilinogen, UA: 0.2 E.U./dL
pH, UA: 6.5 (ref 5.0–8.0)

## 2021-08-21 MED ORDER — CEPHALEXIN 500 MG PO CAPS: 500.0000 mg | ORAL_CAPSULE | Freq: Four times a day (QID) | ORAL | 0 refills | Status: DC

## 2021-08-21 NOTE — ED Triage Notes (Signed)
Has been taking AZO.

## 2021-08-21 NOTE — ED Provider Notes (Signed)
[facility]   CC: Burning with urination  SUBJECTIVE:  Catherine Gonzalez is a 70 y.o. female who complains of burning with urination and RT flank pain x 1 week. Denies delayed bathroom breaks or excessive caffeine intake. Admits to recent antibiotic treatment.  Has NOT tried OTC medications.  Symptoms are made worse with urination.  Admits to similar symptoms in the past.  Denies fever, chills, nausea, vomiting, abdominal pain, abnormal vaginal discharge or bleeding, hematuria.    Also requests covid test  LMP: No LMP recorded. Patient is postmenopausal.  ROS: As in HPI.  All other pertinent ROS negative.     Past Medical History:  Diagnosis Date   Allergic rhinitis    Anemia    Anxiety    Blood transfusion without reported diagnosis    Depression    Ectopic pregnancy    Finger fracture, left    index   GERD (gastroesophageal reflux disease)    Hepatitis C    Hypertension    Hypothyroidism    Seizures ([place])    Past Surgical History:  Procedure Laterality Date   APPENDECTOMY     BRAIN SURGERY     x 3   BREAST ENHANCEMENT SURGERY     CHOLECYSTECTOMY     CLOSED REDUCTION FINGER WITH PERCUTANEOUS PINNING Left 07/25/2020   Procedure: CLOSED REDUCTION FINGER WITH PERCUTANEOUS PINNING;  Surgeon: Daryll Brod, MD;  Location: [place];  Service: Orthopedics;  Laterality: Left;  AXILLARY BLOCK   ECTOPIC PREGNANCY SURGERY     LUMBAR SPINE SURGERY     TONSILLECTOMY AND ADENOIDECTOMY     Allergies  Allergen Reactions   Codeine Nausea And Vomiting   Pegasys [Peginterferon Alfa-2a] Hives   No current facility-administered medications on file prior to encounter.   Current Outpatient Medications on File Prior to Encounter  Medication Sig Dispense Refill   amLODipine (NORVASC) 5 MG tablet Take 5 mg by mouth daily.     fluticasone (FLONASE) 50 MCG/ACT nasal spray Place 2 sprays into both nostrils daily. 11.1 mL 0   hydrOXYzine (ATARAX/VISTARIL) 25 MG  tablet Take 25 mg by mouth 2 (two) times daily as needed.     levETIRAcetam (KEPPRA) 750 MG tablet TAKE 1 TABLET TWICE A DAY (Patient taking differently: Take 750 mg by mouth 2 (two) times daily. ) 180 tablet 3   levothyroxine (SYNTHROID) 75 MCG tablet Take 75 mcg by mouth daily before breakfast. TAKE 1 1/2 TABS DAILY     meclizine (ANTIVERT) 25 MG tablet Take 1 tablet (25 mg total) by mouth 3 (three) times daily as needed for dizziness. 10 tablet 0   olmesartan (BENICAR) 20 MG tablet Take 20 mg by mouth daily.     ondansetron (ZOFRAN ODT) 4 MG disintegrating tablet Take 1 tablet (4 mg total) by mouth every 8 (eight) hours as needed for nausea or vomiting. 20 tablet 0   sodium bicarbonate 325 MG tablet Take 650 mg by mouth 4 (four) times daily -  with meals and at bedtime.      traMADol (ULTRAM) 50 MG tablet Take 1 tablet (50 mg total) by mouth 2 (two) times daily. 8 tablet 0   traMADol (ULTRAM) 50 MG tablet Take 1 tablet (50 mg total) by mouth every 6 (six) hours as needed. 20 tablet 0   traZODone (DESYREL) 50 MG tablet TAKE 1 TABLET BY MOUTH AT BEDTIME FOR SLEEP     Social History   Socioeconomic History   Marital status: Widowed  Spouse name: Not on file   Number of children: 0   Years of education: Not on file   Highest education level: Not on file  Occupational History   Not on file  Tobacco Use   Smoking status: Every Day    Packs/day: 0.25    Years: 40.00    Pack years: 10.00    Types: Cigarettes   Smokeless tobacco: Never  Vaping Use   Vaping Use: Never used  Substance and Sexual Activity   Alcohol use: Yes    Comment: OCC   Drug use: No   Sexual activity: Never  Other Topics Concern   Not on file  Social History Narrative   Not on file   Social Determinants of Health   Financial Resource Strain: Not on file  Food Insecurity: Not on file  Transportation Needs: Not on file  Physical Activity: Not on file  Stress: Not on file  Social Connections: Not on file   Intimate Partner Violence: Not on file   Family History  Problem Relation Age of Onset   Heart failure Mother    Lung cancer Father    Alcohol abuse Brother    Leukemia Brother    Colon cancer Neg Hx     OBJECTIVE:  Vitals:   08/21/21 1201  BP: 130/67  Pulse: (!) 109  Resp: 18  Temp: 99.2 F (37.3 C)  TempSrc: Oral  SpO2: 97%   General appearance: AOx3 in no acute distress HEENT: NCAT.  Oropharynx clear.  Lungs: clear to auscultation bilaterally without adventitious breath sounds Heart: regular rate and rhythm.  Abdomen: soft; non-distended; no tenderness; bowel sounds present; no guarding Back: + RT sided CVA tenderness Extremities: no edema; symmetrical with no gross deformities Skin: warm and dry Neurologic: Ambulates from chair to exam table without difficulty Psychological: alert and cooperative; normal mood and affect  Labs Reviewed  POCT URINALYSIS DIP (MANUAL ENTRY) - Abnormal; Notable for the following components:      Result Value   Color, UA straw (*)    Glucose, UA =100 (*)    Ketones, POC UA small (15) (*)    Blood, UA trace-intact (*)    Protein Ur, POC =30 (*)    Nitrite, UA Positive (*)    All other components within normal limits  NOVEL CORONAVIRUS, NAA  URINE CULTURE    ASSESSMENT & PLAN:  1. Exposure to COVID-19 virus   2. Dysuria     Meds ordered this encounter  Medications   cephALEXin (KEFLEX) 500 MG capsule    Sig: Take 1 capsule (500 mg total) by mouth 4 (four) times daily.    Dispense:  20 capsule    Refill:  0    Order Specific Question:   Supervising Provider    Answer:   Raylene Everts [[id number]]   Urine concerning for infection Urine culture sent.  We will call you with the results.   Push fluids and get plenty of rest.   Take antibiotic as directed and to completion Follow up with PCP if symptoms persists Return here or go to ER if you have any new or worsening symptoms such as fever, worsening abdominal pain,  nausea/vomiting, flank pain, etc...  Outlined signs and symptoms indicating need for more acute intervention. Patient verbalized understanding. After Visit Summary given.      Lestine Box, PA-C 08/21/21 1238

## 2021-08-21 NOTE — ED Triage Notes (Signed)
Burning on urination x 1 week.  Recently took Penicillin for an ear infection.

## 2021-08-21 NOTE — Discharge Instructions (Signed)
Urine concerning for infection Urine culture sent.  We will call you with the results.   Push fluids and get plenty of rest.   Take antibiotic as directed and to completion Follow up with PCP if symptoms persists Return here or go to ER if you have any new or worsening symptoms such as fever, worsening abdominal pain, nausea/vomiting, flank pain, etc... 

## 2021-08-22 LAB — NOVEL CORONAVIRUS, NAA: SARS-CoV-2, NAA: NOT DETECTED

## 2021-08-22 LAB — SARS-COV-2, NAA 2 DAY TAT

## 2021-08-23 LAB — URINE CULTURE

## 2021-09-03 ENCOUNTER — Ambulatory Visit
Admission: EM | Admit: 2021-09-03 | Discharge: 2021-09-03 | Disposition: A | Discharge status: Home / Self Care | Payer: Medicare Other | Attending: Family Medicine | Admitting: Family Medicine

## 2021-09-03 ENCOUNTER — Ambulatory Visit: Payer: Self-pay

## 2021-09-03 ENCOUNTER — Other Ambulatory Visit: Payer: Self-pay

## 2021-09-03 DIAGNOSIS — R300 Dysuria: Secondary | ICD-10-CM | POA: Diagnosis not present

## 2021-09-03 DIAGNOSIS — N898 Other specified noninflammatory disorders of vagina: Secondary | ICD-10-CM | POA: Diagnosis not present

## 2021-09-03 LAB — POCT URINALYSIS DIP (MANUAL ENTRY)
Bilirubin, UA: NEGATIVE
Glucose, UA: NEGATIVE mg/dL
Ketones, POC UA: NEGATIVE mg/dL
Nitrite, UA: NEGATIVE
Protein Ur, POC: 30 mg/dL — AB
Spec Grav, UA: 1.02 (ref 1.010–1.025)
Urobilinogen, UA: 0.2 E.U./dL
pH, UA: 7 (ref 5.0–8.0)

## 2021-09-03 MED ORDER — FLUCONAZOLE 150 MG PO TABS: ORAL_TABLET | ORAL | 0 refills | Status: DC

## 2021-09-03 MED ORDER — CEPHALEXIN 500 MG PO CAPS: 500.0000 mg | ORAL_CAPSULE | Freq: Two times a day (BID) | ORAL | 0 refills | Status: DC

## 2021-09-03 NOTE — ED Provider Notes (Signed)
[place]    ASSESSMENT & PLAN:  1. Dysuria   2. Vaginal itching    Begin: Meds ordered this encounter  Medications   cephALEXin (KEFLEX) 500 MG capsule    Sig: Take 1 capsule (500 mg total) by mouth 2 (two) times daily.    Dispense:  10 capsule    Refill:  0   fluconazole (DIFLUCAN) 150 MG tablet    Sig: Take one tablet by mouth as a single dose. May repeat in 3 days if symptoms persist.    Dispense:  2 tablet    Refill:  0   No signs of pyelonephritis.  Recommend:  Follow-up Information     Celene Squibb, MD.   Specialty: Internal Medicine Why: If worsening or failing to improve as anticipated. Contact information: [facility] [id number] [phone]                 Outlined signs and symptoms indicating need for more acute intervention. Patient verbalized understanding. After Visit Summary given.  SUBJECTIVE:  Catherine Gonzalez is a 70 y.o. female who complains of urinary frequency, dysuria; Since last week. Also vaginal itching. Was on antibiotic for UTI but did not complete secondary to inconclusive urine culture. Without associated flank pain, fever, chills, vaginal discharge or bleeding. Gross hematuria: not present. No specific aggravating or alleviating factors reported. No LE edema. Normal PO intake without n/v/d. Without specific abdominal pain.   LMP: No LMP recorded. Patient is postmenopausal.   OBJECTIVE:  Vitals:   09/03/21 1134  BP: (!) 149/82  Pulse: 100  Resp: 18  Temp: 99 F (37.2 C)  TempSrc: Oral  SpO2: 97%   General appearance: alert; no distress HENT: oropharynx: moist Lungs: unlabored respirations Abdomen: soft, non-tender; bowel sounds normal; no masses or organomegaly; no guarding or rebound tenderness Back: no CVA tenderness Extremities: no edema; symmetrical with no gross deformities Skin: warm and dry Neurologic: normal gait Psychological: alert and cooperative; normal mood and  affect  Labs Reviewed  POCT URINALYSIS DIP (MANUAL ENTRY) - Abnormal; Notable for the following components:      Result Value   Blood, UA moderate (*)    Protein Ur, POC =30 (*)    Leukocytes, UA Trace (*)    All other components within normal limits    Allergies  Allergen Reactions   Codeine Nausea And Vomiting   Pegasys [Peginterferon Alfa-2a] Hives    Past Medical History:  Diagnosis Date   Allergic rhinitis    Anemia    Anxiety    Blood transfusion without reported diagnosis    Depression    Ectopic pregnancy    Finger fracture, left    index   GERD (gastroesophageal reflux disease)    Hepatitis C    Hypertension    Hypothyroidism    Seizures ([facility])    Social History   Socioeconomic History   Marital status: Widowed    Spouse name: Not on file   Number of children: 0   Years of education: Not on file   Highest education level: Not on file  Occupational History   Not on file  Tobacco Use   Smoking status: Every Day    Packs/day: 0.25    Years: 40.00    Pack years: 10.00    Types: Cigarettes   Smokeless tobacco: Never  Vaping Use   Vaping Use: Never used  Substance and Sexual Activity   Alcohol use: Yes  Comment: OCC   Drug use: No   Sexual activity: Never  Other Topics Concern   Not on file  Social History Narrative   Not on file   Social Determinants of Health   Financial Resource Strain: Not on file  Food Insecurity: Not on file  Transportation Needs: Not on file  Physical Activity: Not on file  Stress: Not on file  Social Connections: Not on file  Intimate Partner Violence: Not on file   Family History  Problem Relation Age of Onset   Heart failure Mother    Lung cancer Father    Alcohol abuse Brother    Leukemia Brother    Colon cancer Neg Hx         Vanessa Kick, MD 09/03/21 1448

## 2021-09-03 NOTE — ED Triage Notes (Signed)
Pt presents with continued pain with urination and itching. Patient was seen and diagnosed with a yeast infection. Pt has tried monistat with no relief.

## 2021-09-19 ENCOUNTER — Encounter: Payer: Self-pay | Admitting: Urology

## 2021-09-19 ENCOUNTER — Ambulatory Visit (INDEPENDENT_AMBULATORY_CARE_PROVIDER_SITE_OTHER): Payer: Medicare Other | Admitting: Urology

## 2021-09-19 ENCOUNTER — Other Ambulatory Visit: Payer: Self-pay

## 2021-09-19 VITALS — BP 115/60 | Temp 99.1°F | Wt 108.0 lb

## 2021-09-19 DIAGNOSIS — R3129 Other microscopic hematuria: Secondary | ICD-10-CM | POA: Diagnosis not present

## 2021-09-19 DIAGNOSIS — R300 Dysuria: Secondary | ICD-10-CM | POA: Diagnosis not present

## 2021-09-19 LAB — URINALYSIS, ROUTINE W REFLEX MICROSCOPIC
Bilirubin, UA: NEGATIVE
Glucose, UA: NEGATIVE
Ketones, UA: NEGATIVE
Leukocytes,UA: NEGATIVE
Nitrite, UA: NEGATIVE
Specific Gravity, UA: 1.02 (ref 1.005–1.030)
Urobilinogen, Ur: 0.2 mg/dL (ref 0.2–1.0)
pH, UA: 6 (ref 5.0–7.5)

## 2021-09-19 LAB — MICROSCOPIC EXAMINATION
Renal Epithel, UA: NONE SEEN /hpf
WBC, UA: NONE SEEN /hpf (ref 0–5)

## 2021-09-19 NOTE — Progress Notes (Signed)
Assessment: 1. Microscopic hematuria   2. Dysuria     Plan: Today I had a discussion with the patient regarding the findings of microscopic hematuria including the implications and differential diagnoses associated with it.  I also discussed recommendations for further evaluation including the rationale for upper tract imaging and cystoscopy.  I discussed the nature of these procedures including potential risk and complications.  The patient expressed an understanding of these issues. Schedule for CT hematuria protocol and cystoscopy Urine culture today  Chief Complaint:  Chief Complaint  Patient presents with   Dysuria     History of Present Illness:  Catherine Gonzalez is a 70 y.o. year old female who is seen in consultation from Celene Squibb, MD  for evaluation of microscopic hematuria and dysuria.  She presented to the emergency room with vertigo and was diagnosed with a possible UTI.  She did report some dysuria and vaginal itching at that time.  She was treated with antibiotics.  Her symptoms did not improve.  She was again seen at urgent care and diagnosed with a vaginal yeast infection.  She tried over-the-counter medication without significant improvement.  A dipstick urinalysis from 09/11/2021 showed 3+ blood.  She has not had any gross hematuria.  She reports decreased dysuria and a decrease in the vaginal itching.  No flank pain.  She does report some discomfort at the start of urination.  She also reports some intermittent right lower quadrant discomfort which she rates as a 4/10.  No history of kidney stones.  She does have a remote history of UTI approximately 2 years ago.  CT imaging from 6/21 showed bilateral adrenal hyperplasia, 2 small cyst in the left kidney, no stones or hydronephrosis.  No recent imaging has been performed.    Past Medical History:  Past Medical History:  Diagnosis Date   Allergic rhinitis    Anemia    Anxiety    Blood transfusion without  reported diagnosis    Depression    Ectopic pregnancy    Finger fracture, left    index   GERD (gastroesophageal reflux disease)    Hepatitis C    Hypertension    Hypothyroidism    Seizures (HCC)     Past Surgical History:  Past Surgical History:  Procedure Laterality Date   APPENDECTOMY     BRAIN SURGERY     x 3   BREAST ENHANCEMENT SURGERY     CHOLECYSTECTOMY     CLOSED REDUCTION FINGER WITH PERCUTANEOUS PINNING Left 07/25/2020   Procedure: CLOSED REDUCTION FINGER WITH PERCUTANEOUS PINNING;  Surgeon: Daryll Brod, MD;  Location: [facility];  Service: Orthopedics;  Laterality: Left;  AXILLARY BLOCK   ECTOPIC PREGNANCY SURGERY     LUMBAR SPINE SURGERY     TONSILLECTOMY AND ADENOIDECTOMY      Allergies:  Allergies  Allergen Reactions   Codeine Nausea And Vomiting   Pegasys [Peginterferon Alfa-2a] Hives    Family History:  Family History  Problem Relation Age of Onset   Heart failure Mother    Lung cancer Father    Alcohol abuse Brother    Leukemia Brother    Colon cancer Neg Hx     Social History:  Social History   Tobacco Use   Smoking status: Every Day    Packs/day: 0.25    Years: 40.00    Pack years: 10.00    Types: Cigarettes   Smokeless tobacco: Never  Vaping Use   Vaping Use:  Never used  Substance Use Topics   Alcohol use: Yes    Comment: OCC   Drug use: No    Review of symptoms:  Constitutional:  Negative for unexplained weight loss, night sweats, fever, chills ENT:  Negative for nose bleeds, sinus pain, painful swallowing CV:  Negative for chest pain, shortness of breath, exercise intolerance, palpitations, loss of consciousness Resp:  Negative for cough, wheezing, shortness of breath GI:  Negative for nausea, vomiting, diarrhea, bloody stools GU:  Positives noted in HPI; otherwise negative for gross hematuria, urinary incontinence Neuro:  Negative for seizures, poor balance, limb weakness, slurred speech Psych:  Negative for  lack of energy, depression, anxiety Endocrine:  Negative for polydipsia, polyuria, symptoms of hypoglycemia (dizziness, hunger, sweating) Hematologic:  Negative for anemia, purpura, petechia, prolonged or excessive bleeding, use of anticoagulants  Allergic:  Negative for difficulty breathing or choking as a result of exposure to anything; no shellfish allergy; no allergic response (rash/itch) to materials, foods  Physical exam: BP 115/60   Temp 99.1 F (37.3 C)   Wt 108 lb (49 kg)   BMI 17.97 kg/m  GENERAL APPEARANCE:  Well appearing, well developed, well nourished, NAD HEENT: Atraumatic, Normocephalic, oropharynx clear. NECK: Supple without lymphadenopathy or thyromegaly. LUNGS: Clear to auscultation bilaterally. HEART: Regular Rate and Rhythm without murmurs, gallops, or rubs. ABDOMEN: Soft, non-tender, No Masses. EXTREMITIES: Moves all extremities well.  Without clubbing, cyanosis, or edema. NEUROLOGIC:  Alert and oriented x 3, normal gait, CN II-XII grossly intact.  MENTAL STATUS:  Appropriate. BACK:  Non-tender to palpation.  No CVAT SKIN:  Warm, dry and intact.    Results: U/A:  3-10 RBCs, few bacteria

## 2021-09-19 NOTE — Progress Notes (Signed)
Urological Symptom Review  Patient is experiencing the following symptoms: Burning/pain with urination Get up at night to urinate Trouble starting stream Blood in urine Urinary tract infection   Review of Systems  Gastrointestinal (upper)  : Indigestion/heartburn  Gastrointestinal (lower) : Negative for lower GI symptoms  Constitutional : Negative for symptoms  Skin: Negative for skin symptoms  Eyes: Negative for eye symptoms  Ear/Nose/Throat : Negative for Ear/Nose/Throat symptoms  Hematologic/Lymphatic: Negative for Hematologic/Lymphatic symptoms  Cardiovascular : Negative for cardiovascular symptoms  Respiratory : Negative for respiratory symptoms  Endocrine: Negative for endocrine symptoms  Musculoskeletal: Back pain  Neurological: Dizziness  Psychologic: Negative for psychiatric symptoms

## 2021-09-22 LAB — URINE CULTURE

## 2021-09-23 ENCOUNTER — Telehealth: Payer: Self-pay

## 2021-09-23 ENCOUNTER — Other Ambulatory Visit: Payer: Self-pay

## 2021-09-23 ENCOUNTER — Encounter (HOSPITAL_COMMUNITY): Payer: Self-pay | Admitting: Radiology

## 2021-09-23 ENCOUNTER — Ambulatory Visit (HOSPITAL_COMMUNITY)
Admission: RE | Admit: 2021-09-23 | Discharge: 2021-09-23 | Disposition: A | Discharge status: Home / Self Care | Payer: Medicare Other | Source: Ambulatory Visit | Attending: Urology | Admitting: Urology

## 2021-09-23 DIAGNOSIS — R3129 Other microscopic hematuria: Secondary | ICD-10-CM | POA: Diagnosis present

## 2021-09-23 LAB — POCT I-STAT CREATININE: Creatinine, Ser: 0.9 mg/dL (ref 0.44–1.00)

## 2021-09-23 MED ORDER — SULFAMETHOXAZOLE-TRIMETHOPRIM 800-160 MG PO TABS
1.0000 | ORAL_TABLET | Freq: Two times a day (BID) | ORAL | 0 refills | Status: AC
Start: 2021-09-23 — End: 2021-09-28

## 2021-09-23 MED ORDER — IOHEXOL 350 MG/ML SOLN: 25.0000 mL | Freq: Once | INTRAVENOUS | Status: DC | PRN

## 2021-09-23 MED ORDER — IOHEXOL 350 MG/ML SOLN
25.0000 mL | Freq: Once | INTRAVENOUS | Status: AC | PRN
  Administered 2021-09-23: 25 mL via INTRAVENOUS

## 2021-09-23 NOTE — Telephone Encounter (Signed)
Called and lvm for pt to return call

## 2021-09-23 NOTE — Telephone Encounter (Signed)
-----   Message from Primus Bravo, MD sent at 09/23/2021 10:20 AM EDT ----- Please notify patient to begin Bactrim twice a day for treatment of UTI.  Prescription sent.  Keep appointment for follow-up as scheduled.

## 2021-09-23 NOTE — Addendum Note (Signed)
Addended by: Primus Bravo on: 09/23/2021 10:20 AM   Modules accepted: Orders

## 2021-09-24 NOTE — Telephone Encounter (Signed)
Patient called and made aware.

## 2021-09-30 ENCOUNTER — Ambulatory Visit (INDEPENDENT_AMBULATORY_CARE_PROVIDER_SITE_OTHER): Payer: Medicare Other | Admitting: Urology

## 2021-09-30 ENCOUNTER — Encounter: Payer: Self-pay | Admitting: Urology

## 2021-09-30 ENCOUNTER — Other Ambulatory Visit: Payer: Self-pay

## 2021-09-30 VITALS — BP 101/45 | HR 116 | Temp 98.9°F

## 2021-09-30 DIAGNOSIS — R3129 Other microscopic hematuria: Secondary | ICD-10-CM

## 2021-09-30 DIAGNOSIS — Z87440 Personal history of urinary (tract) infections: Secondary | ICD-10-CM | POA: Diagnosis not present

## 2021-09-30 DIAGNOSIS — N952 Postmenopausal atrophic vaginitis: Secondary | ICD-10-CM

## 2021-09-30 LAB — URINALYSIS, ROUTINE W REFLEX MICROSCOPIC
Bilirubin, UA: NEGATIVE
Glucose, UA: NEGATIVE
Ketones, UA: NEGATIVE
Leukocytes,UA: NEGATIVE
Nitrite, UA: NEGATIVE
Protein,UA: NEGATIVE
Specific Gravity, UA: 1.02 (ref 1.005–1.030)
Urobilinogen, Ur: 0.2 mg/dL (ref 0.2–1.0)
pH, UA: 5.5 (ref 5.0–7.5)

## 2021-09-30 MED ORDER — FLUCONAZOLE 150 MG PO TABS: 150.0000 mg | ORAL_TABLET | Freq: Once | ORAL | 0 refills | Status: AC

## 2021-09-30 MED ORDER — ESTRADIOL 0.1 MG/GM VA CREA: TOPICAL_CREAM | VAGINAL | 12 refills | Status: DC

## 2021-09-30 MED ORDER — CIPROFLOXACIN HCL 500 MG PO TABS
500.0000 mg | ORAL_TABLET | Freq: Once | ORAL | Status: AC
  Administered 2021-09-30: 500 mg via ORAL

## 2021-09-30 NOTE — Progress Notes (Signed)
Urological Symptom Review  Patient is experiencing the following symptoms: Burning/pain with urination Get up at night to urinate   Review of Systems  Gastrointestinal (upper)  : Negative for upper GI symptoms  Gastrointestinal (lower) : Negative for lower GI symptoms  Constitutional : Negative for symptoms  Skin: Negative for skin symptoms  Eyes: Negative for eye symptoms  Ear/Nose/Throat : Sinus problems  Hematologic/Lymphatic: Negative for Hematologic/Lymphatic symptoms  Cardiovascular : Negative for cardiovascular symptoms  Respiratory : Negative for respiratory symptoms  Endocrine: Negative for endocrine symptoms  Musculoskeletal: Back pain  Neurological: Negative for neurological symptoms  Psychologic: Negative for psychiatric symptoms

## 2021-09-30 NOTE — Progress Notes (Signed)
Assessment: 1. Microscopic hematuria   2. History of UTI   3. Atrophic vaginitis     Plan: I personally reviewed the CT study from 09/25/2021 which showed no obvious renal mass, renal or ureteral calculi or evidence of obstruction or filling defect.  These results as well as the results of cystoscopy discussed with the patient today.  No life-threatening or serious causes for the microscopic hematuria identified. Cipro x 1 following cystoscopy She does have some evidence of vaginal irritation with atrophic changes which may be the source of her dysuria Recommend treatment for vaginal candidiasis with fluconazole 150 mg x 1 dose Recommend vaginal hormone cream 2-3 times per week. Return to office in 6 weeks  Chief Complaint: Chief Complaint  Patient presents with   Hematuria   Dysuria     HPI: Catherine Gonzalez is a 70 y.o. female who presents for continued evaluation of microscopic hematuria and dysuria.  She presented to the emergency room with vertigo and was diagnosed with a possible UTI.  She did report some dysuria and vaginal itching at that time.  She was treated with antibiotics.  Her symptoms did not improve.  She was again seen at urgent care and diagnosed with a vaginal yeast infection.  She tried over-the-counter medication without significant improvement.  A dipstick urinalysis from 09/11/2021 showed 3+ blood.  No history of gross hematuria.  She reported decreased dysuria and a decrease in the vaginal itching.  No flank pain.  She noted some discomfort at the start of urination.  She also reported some intermittent right lower quadrant discomfort which she rated as a 4/10.  No history of kidney stones.  She does have a remote history of UTI approximately 2 years ago.  CT imaging from 6/21 showed bilateral adrenal hyperplasia, 2 small cyst in the left kidney, no stones or hydronephrosis.   Urine culture from 1/20 grew >100 K. Proteus.  She was treated with Bactrim.  CT  imaging from 09/23/2021 showed no renal or ureteral calculi, no renal masses, and no obstruction. She presents today for cystoscopy. She continues to have some dysuria and vaginal irritation.  No flank pain or gross hematuria.   Portions of the above documentation were copied from a prior visit for review purposes only.  Allergies: Allergies  Allergen Reactions   Codeine Nausea And Vomiting   Pegasys [Peginterferon Alfa-2a] Hives    PMH: Past Medical History:  Diagnosis Date   Allergic rhinitis    Anemia    Anxiety    Blood transfusion without reported diagnosis    Depression    Ectopic pregnancy    Finger fracture, left    index   GERD (gastroesophageal reflux disease)    Hepatitis C    Hypertension    Hypothyroidism    Seizures (HCC)     PSH: Past Surgical History:  Procedure Laterality Date   APPENDECTOMY     BRAIN SURGERY     x 3   BREAST ENHANCEMENT SURGERY     CHOLECYSTECTOMY     CLOSED REDUCTION FINGER WITH PERCUTANEOUS PINNING Left 07/25/2020   Procedure: CLOSED REDUCTION FINGER WITH PERCUTANEOUS PINNING;  Surgeon: Daryll Brod, MD;  Location: [facility];  Service: Orthopedics;  Laterality: Left;  AXILLARY BLOCK   ECTOPIC PREGNANCY SURGERY     LUMBAR SPINE SURGERY     TONSILLECTOMY AND ADENOIDECTOMY      SH: Social History   Tobacco Use   Smoking status: Every Day  Packs/day: 0.25    Years: 40.00    Pack years: 10.00    Types: Cigarettes   Smokeless tobacco: Never  Vaping Use   Vaping Use: Never used  Substance Use Topics   Alcohol use: Yes    Comment: OCC   Drug use: No    ROS: Constitutional:  Negative for fever, chills, weight loss CV: Negative for chest pain, previous MI, hypertension Respiratory:  Negative for shortness of breath, wheezing, sleep apnea, frequent cough GI:  Negative for nausea, vomiting, bloody stool, GERD  PE: BP (!) 101/45   Pulse (!) 116   Temp 98.9 F (37.2 C)  GENERAL APPEARANCE:  Well  appearing, well developed, well nourished, NAD HEENT:  Atraumatic, normocephalic, oropharynx clear NECK:  Supple without lymphadenopathy or thyromegaly ABDOMEN:  Soft, non-tender, no masses EXTREMITIES:  Moves all extremities well, without clubbing, cyanosis, or edema NEUROLOGIC:  Alert and oriented x 3, normal gait, CN II-XII grossly intact MENTAL STATUS:  appropriate BACK:  Non-tender to palpation, No CVAT SKIN:  Warm, dry, and intact GU: Vagina: atrophic, vaginal erythema, and vaginal tenderness    Results: U/A dipstick:  2+ blood, -LE, - nitrite  CT ABDOMEN AND PELVIS WITHOUT AND WITH CONTRAST   TECHNIQUE: Multidetector CT imaging of the abdomen and pelvis was performed following the standard protocol before and following the bolus administration of intravenous contrast.   CONTRAST:  59mL OMNIPAQUE IOHEXOL 350 MG/ML SOLN   COMPARISON:  05/18/2020   FINDINGS: Lower chest: No acute abnormality.  Coronary artery calcification.   Hepatobiliary: No focal liver abnormality is seen. Status post cholecystectomy. No biliary dilatation.   Pancreas: Unremarkable. No pancreatic ductal dilatation or surrounding inflammatory changes.   Spleen: Normal in size without significant abnormality.   Adrenals/Urinary Tract: Adrenal glands are unremarkable. Kidneys are normal, without renal calculi, solid lesion, or hydronephrosis. No urinary tract filling defect on delayed phase imaging. Bladder is unremarkable.   Stomach/Bowel: Stomach is within normal limits. Appendix is not clearly visualized. No evidence of bowel wall thickening, distention, or inflammatory changes. Sigmoid diverticula.   Vascular/Lymphatic: Aortic atherosclerosis. No enlarged abdominal or pelvic lymph nodes.   Reproductive: No mass or other significant abnormality.   Other: No abdominal wall hernia or abnormality. No abdominopelvic ascites.   Musculoskeletal: No acute or significant osseous findings.    IMPRESSION: 1. No CT findings to explain hematuria. No evidence of urinary tract calculus, mass, or hydronephrosis. No urinary tract filling defect on delayed phase imaging. 2. Sigmoid diverticulosis without evidence for diverticulitis. 3. Coronary artery disease.   Aortic Atherosclerosis (ICD10-I70.0).     Electronically Signed   By: Delanna Ahmadi M.D.   On: 09/25/2021 08:51  Procedure:  Flexible Cystourethroscopy  Pre-operative Diagnosis: Microscopic hematuria  Post-operative Diagnosis: Microscopic hematuria  Anesthesia:  local with lidocaine jelly  Surgical Narrative:  After appropriate informed consent was obtained, the patient was prepped and draped in the usual sterile fashion in the supine position.  He was correctly identified and the proper procedure delineated prior to proceeding.  Sterile lidocaine gel was instilled in the urethra. The flexible cystoscope was introduced without difficulty.  Findings:  Anterior urethra: Normal  Bladder: Normal  Ureteral orifices: normal  Additional findings: None  Saline bladder wash for cytology was not performed.    The cystoscope was then removed.  The patient tolerated the procedure well.

## 2021-10-29 DIAGNOSIS — R519 Headache, unspecified: Secondary | ICD-10-CM | POA: Insufficient documentation

## 2021-11-06 ENCOUNTER — Other Ambulatory Visit: Payer: Self-pay

## 2021-11-06 ENCOUNTER — Ambulatory Visit (INDEPENDENT_AMBULATORY_CARE_PROVIDER_SITE_OTHER): Payer: Medicare Other | Admitting: Urology

## 2021-11-06 ENCOUNTER — Encounter: Payer: Self-pay | Admitting: Urology

## 2021-11-06 VITALS — BP 163/78 | HR 121

## 2021-11-06 DIAGNOSIS — Z87440 Personal history of urinary (tract) infections: Secondary | ICD-10-CM

## 2021-11-06 DIAGNOSIS — N952 Postmenopausal atrophic vaginitis: Secondary | ICD-10-CM

## 2021-11-06 DIAGNOSIS — R3129 Other microscopic hematuria: Secondary | ICD-10-CM

## 2021-11-06 DIAGNOSIS — R300 Dysuria: Secondary | ICD-10-CM | POA: Diagnosis not present

## 2021-11-06 LAB — URINALYSIS, ROUTINE W REFLEX MICROSCOPIC
Bilirubin, UA: NEGATIVE
Glucose, UA: NEGATIVE
Leukocytes,UA: NEGATIVE
Nitrite, UA: NEGATIVE
Specific Gravity, UA: 1.02 (ref 1.005–1.030)
Urobilinogen, Ur: 1 mg/dL (ref 0.2–1.0)
pH, UA: 7 (ref 5.0–7.5)

## 2021-11-06 LAB — MICROSCOPIC EXAMINATION
Bacteria, UA: NONE SEEN
Renal Epithel, UA: NONE SEEN /hpf
WBC, UA: NONE SEEN /hpf (ref 0–5)

## 2021-11-06 NOTE — Progress Notes (Signed)
Assessment: 1. Microscopic hematuria; negative evaluation 11/22   2. History of UTI   3. Dysuria   4. Atrophic vaginitis     Plan: Continue vaginal hormone cream 2-3 times per week. Return to office in 3-4 months.  Chief Complaint: Chief Complaint  Patient presents with   Hematuria     HPI: Catherine Gonzalez is a 70 y.o. female who presents for continued evaluation of microscopic hematuria and dysuria.  She presented to the emergency room with vertigo and was diagnosed with a possible UTI.  She did report some dysuria and vaginal itching at that time.  She was treated with antibiotics.  Her symptoms did not improve.  She was again seen at urgent care and diagnosed with a vaginal yeast infection.  She tried over-the-counter medication without significant improvement.  A dipstick urinalysis from 09/11/2021 showed 3+ blood.  No history of gross hematuria.  She reported decreased dysuria and a decrease in the vaginal itching.  No flank pain.  She noted some discomfort at the start of urination.  She also reported some intermittent right lower quadrant discomfort which she rated as a 4/10.  No history of kidney stones.  She does have a remote history of UTI approximately 2 years ago.  CT imaging from 6/21 showed bilateral adrenal hyperplasia, 2 small cyst in the left kidney, no stones or hydronephrosis.   Urine culture from 1/20 grew >100 K. Proteus.  She was treated with Bactrim.  CT imaging from 09/23/2021 showed no renal or ureteral calculi, no renal masses, and no obstruction. Cystoscopy from 09/30/2021 showed no urethral or bladder abnormalities.  Pelvic exam showed atrophic vaginitis.  She was started on vaginal hormone cream and treated for vaginal candidiasis with fluconazole x1.   She returns today for follow-up.  She reports that she is doing very well.  She is not having any dysuria or vaginal irritation.  She is using the vaginal hormone cream 2-3 times per week without side  effect.  No gross hematuria or flank pain.  She is very happy with her current situation.  Portions of the above documentation were copied from a prior visit for review purposes only.  Allergies: Allergies  Allergen Reactions   Codeine Nausea And Vomiting   Pegasys [Peginterferon Alfa-2a] Hives    PMH: Past Medical History:  Diagnosis Date   Allergic rhinitis    Anemia    Anxiety    Blood transfusion without reported diagnosis    Depression    Ectopic pregnancy    Finger fracture, left    index   GERD (gastroesophageal reflux disease)    Hepatitis C    Hypertension    Hypothyroidism    Seizures (HCC)     PSH: Past Surgical History:  Procedure Laterality Date   APPENDECTOMY     BRAIN SURGERY     x 3   BREAST ENHANCEMENT SURGERY     CHOLECYSTECTOMY     CLOSED REDUCTION FINGER WITH PERCUTANEOUS PINNING Left 07/25/2020   Procedure: CLOSED REDUCTION FINGER WITH PERCUTANEOUS PINNING;  Surgeon: Daryll Brod, MD;  Location: [facility];  Service: Orthopedics;  Laterality: Left;  AXILLARY BLOCK   ECTOPIC PREGNANCY SURGERY     LUMBAR SPINE SURGERY     TONSILLECTOMY AND ADENOIDECTOMY      SH: Social History   Tobacco Use   Smoking status: Every Day    Packs/day: 0.25    Years: 40.00    Pack years: 10.00    Types:  Cigarettes   Smokeless tobacco: Never  Vaping Use   Vaping Use: Never used  Substance Use Topics   Alcohol use: Yes    Comment: OCC   Drug use: No   ROS: Constitutional:  Negative for fever, chills, weight loss CV: Negative for chest pain, previous MI, hypertension Respiratory:  Negative for shortness of breath, wheezing, sleep apnea, frequent cough GI:  Negative for nausea, vomiting, bloody stool, GERD  PE: BP (!) 163/78   Pulse (!) 121  GENERAL APPEARANCE:  Well appearing, well developed, well nourished, NAD HEENT:  Atraumatic, normocephalic, oropharynx clear NECK:  Supple without lymphadenopathy or thyromegaly ABDOMEN:  Soft,  non-tender, no masses EXTREMITIES:  Moves all extremities well, without clubbing, cyanosis, or edema NEUROLOGIC:  Alert and oriented x 3, normal gait, CN II-XII grossly intact MENTAL STATUS:  appropriate BACK:  Non-tender to palpation, No CVAT SKIN:  Warm, dry, and intact   Results: U/A:  3-10 RBC

## 2021-11-06 NOTE — Progress Notes (Signed)

## 2021-11-10 ENCOUNTER — Ambulatory Visit: Payer: Medicare Other | Admitting: Urology

## 2021-12-25 DIAGNOSIS — R0981 Nasal congestion: Secondary | ICD-10-CM | POA: Insufficient documentation

## 2021-12-25 DIAGNOSIS — R519 Headache, unspecified: Secondary | ICD-10-CM | POA: Diagnosis not present

## 2021-12-25 DIAGNOSIS — R6883 Chills (without fever): Secondary | ICD-10-CM | POA: Diagnosis not present

## 2022-01-08 DIAGNOSIS — R7301 Impaired fasting glucose: Secondary | ICD-10-CM | POA: Insufficient documentation

## 2022-01-08 DIAGNOSIS — E039 Hypothyroidism, unspecified: Secondary | ICD-10-CM | POA: Diagnosis not present

## 2022-01-08 DIAGNOSIS — E782 Mixed hyperlipidemia: Secondary | ICD-10-CM | POA: Diagnosis not present

## 2022-01-08 DIAGNOSIS — I10 Essential (primary) hypertension: Secondary | ICD-10-CM | POA: Diagnosis not present

## 2022-01-10 DIAGNOSIS — R7303 Prediabetes: Secondary | ICD-10-CM | POA: Insufficient documentation

## 2022-01-12 DIAGNOSIS — J302 Other seasonal allergic rhinitis: Secondary | ICD-10-CM | POA: Diagnosis not present

## 2022-01-12 DIAGNOSIS — Z0001 Encounter for general adult medical examination with abnormal findings: Secondary | ICD-10-CM | POA: Diagnosis not present

## 2022-01-12 DIAGNOSIS — I10 Essential (primary) hypertension: Secondary | ICD-10-CM | POA: Diagnosis not present

## 2022-01-12 DIAGNOSIS — D72828 Other elevated white blood cell count: Secondary | ICD-10-CM | POA: Diagnosis not present

## 2022-01-12 DIAGNOSIS — Z86011 Personal history of benign neoplasm of the brain: Secondary | ICD-10-CM | POA: Diagnosis not present

## 2022-01-12 DIAGNOSIS — F411 Generalized anxiety disorder: Secondary | ICD-10-CM | POA: Diagnosis not present

## 2022-01-12 DIAGNOSIS — R270 Ataxia, unspecified: Secondary | ICD-10-CM | POA: Diagnosis not present

## 2022-01-12 DIAGNOSIS — E039 Hypothyroidism, unspecified: Secondary | ICD-10-CM | POA: Diagnosis not present

## 2022-01-12 DIAGNOSIS — E878 Other disorders of electrolyte and fluid balance, not elsewhere classified: Secondary | ICD-10-CM | POA: Diagnosis not present

## 2022-01-12 DIAGNOSIS — F5101 Primary insomnia: Secondary | ICD-10-CM | POA: Diagnosis not present

## 2022-01-12 DIAGNOSIS — E782 Mixed hyperlipidemia: Secondary | ICD-10-CM | POA: Diagnosis not present

## 2022-01-12 DIAGNOSIS — K219 Gastro-esophageal reflux disease without esophagitis: Secondary | ICD-10-CM | POA: Diagnosis not present

## 2022-02-04 ENCOUNTER — Other Ambulatory Visit: Payer: Self-pay

## 2022-02-04 ENCOUNTER — Encounter: Payer: Self-pay | Admitting: Urology

## 2022-02-04 ENCOUNTER — Ambulatory Visit (INDEPENDENT_AMBULATORY_CARE_PROVIDER_SITE_OTHER): Payer: Medicare Other | Admitting: Urology

## 2022-02-04 VITALS — BP 170/94 | HR 118

## 2022-02-04 DIAGNOSIS — R3129 Other microscopic hematuria: Secondary | ICD-10-CM

## 2022-02-04 DIAGNOSIS — Z87440 Personal history of urinary (tract) infections: Secondary | ICD-10-CM | POA: Diagnosis not present

## 2022-02-04 DIAGNOSIS — N952 Postmenopausal atrophic vaginitis: Secondary | ICD-10-CM

## 2022-02-04 LAB — URINALYSIS, ROUTINE W REFLEX MICROSCOPIC
Bilirubin, UA: NEGATIVE
Glucose, UA: NEGATIVE
Ketones, UA: NEGATIVE
Leukocytes,UA: NEGATIVE
Nitrite, UA: NEGATIVE
Specific Gravity, UA: 1.025 (ref 1.005–1.030)
Urobilinogen, Ur: 0.2 mg/dL (ref 0.2–1.0)
pH, UA: 6 (ref 5.0–7.5)

## 2022-02-04 LAB — MICROSCOPIC EXAMINATION: WBC, UA: NONE SEEN /hpf (ref 0–5)

## 2022-02-04 NOTE — Progress Notes (Addendum)
? ?Assessment: ?1. Microscopic hematuria; negative evaluation 11/22   ?2. History of UTI   ?3. Atrophic vaginitis   ? ? ?Plan: ?Continue vaginal hormone cream 2-3 times per week. ?Return to office in 6 months ? ?Chief Complaint: ?Chief Complaint  ?Patient presents with  ? Hematuria  ? ? ?HPI: ?Catherine Gonzalez is a 71 y.o. female who presents for continued evaluation of microscopic hematuria and dysuria.  She presented to the emergency room with vertigo and was diagnosed with a possible UTI.  She did report some dysuria and vaginal itching at that time.  She was treated with antibiotics.  Her symptoms did not improve.  She was again seen at urgent care and diagnosed with a vaginal yeast infection.  She tried over-the-counter medication without significant improvement.  A dipstick urinalysis from 09/11/2021 showed 3+ blood.  No history of gross hematuria.  She reported decreased dysuria and a decrease in the vaginal itching.  No flank pain.  She noted some discomfort at the start of urination.  She also reported some intermittent right lower quadrant discomfort which she rated as a 4/10.  No history of kidney stones.  She does have a remote history of UTI approximately 2 years ago.  CT imaging from 6/21 showed bilateral adrenal hyperplasia, 2 small cyst in the left kidney, no stones or hydronephrosis.   ?Urine culture from 1/20 grew >100 K. Proteus.  She was treated with Bactrim.  CT imaging from 09/23/2021 showed no renal or ureteral calculi, no renal masses, and no obstruction. ?Cystoscopy from 09/30/2021 showed no urethral or bladder abnormalities.  Pelvic exam showed atrophic vaginitis.  She was started on vaginal hormone cream and treated for vaginal candidiasis with fluconazole x1. ?  ?At her visit in 12/22, she was doing very well.  She was not having any dysuria or vaginal irritation.  She was using the vaginal hormone cream 2-3 times per week without side effect.  No gross hematuria or flank pain.    ? ?She returns today for follow-up.  She continues to use vaginal hormone cream 3 times per week.  She reports occasional dysuria.  No gross hematuria or flank pain. ? ?Portions of the above documentation were copied from a prior visit for review purposes only. ? ?Allergies: ?Allergies  ?Allergen Reactions  ? Codeine Nausea And Vomiting  ? Pegasys [Peginterferon Alfa-2a] Hives  ? ? ?PMH: ?Past Medical History:  ?Diagnosis Date  ? Allergic rhinitis   ? Anemia   ? Anxiety   ? Blood transfusion without reported diagnosis   ? Depression   ? Ectopic pregnancy   ? Finger fracture, left   ? index  ? GERD (gastroesophageal reflux disease)   ? Hepatitis C   ? Hypertension   ? Hypothyroidism   ? Seizures ([facility])   ? ? ?PSH: ?Past Surgical History:  ?Procedure Laterality Date  ? APPENDECTOMY    ? BRAIN SURGERY    ? x 3  ? BREAST ENHANCEMENT SURGERY    ? CHOLECYSTECTOMY    ? CLOSED REDUCTION FINGER WITH PERCUTANEOUS PINNING Left 07/25/2020  ? Procedure: CLOSED REDUCTION FINGER WITH PERCUTANEOUS PINNING;  Surgeon: Daryll Brod, MD;  Location: [facility];  Service: Orthopedics;  Laterality: Left;  AXILLARY BLOCK  ? ECTOPIC PREGNANCY SURGERY    ? LUMBAR SPINE SURGERY    ? TONSILLECTOMY AND ADENOIDECTOMY    ? ? ?SH: ?Social History  ? ?Tobacco Use  ? Smoking status: Every Day  ?  Packs/day: 0.25  ?  Years: 40.00  ?  Pack years: 10.00  ?  Types: Cigarettes  ? Smokeless tobacco: Never  ?Vaping Use  ? Vaping Use: Never used  ?Substance Use Topics  ? Alcohol use: Yes  ?  Comment: OCC  ? Drug use: No  ? ?ROS: ?Constitutional:  Negative for fever, chills, weight loss ?CV: Negative for chest pain, previous MI, hypertension ?Respiratory:  Negative for shortness of breath, wheezing, sleep apnea, frequent cough ?GI:  Negative for nausea, vomiting, bloody stool, GERD ? ?PE: ?BP (!) 170/94   Pulse (!) 118  ?GENERAL APPEARANCE:  Well appearing, well developed, well nourished, NAD ?HEENT:  Atraumatic, normocephalic, oropharynx  clear ?NECK:  Supple without lymphadenopathy or thyromegaly ?ABDOMEN:  Soft, non-tender, no masses ?EXTREMITIES:  Moves all extremities well, without clubbing, cyanosis, or edema ?NEUROLOGIC:  Alert and oriented x 3, normal gait, CN II-XII grossly intact ?MENTAL STATUS:  appropriate ?BACK:  Non-tender to palpation, No CVAT ?SKIN:  Warm, dry, and intact ? ?Results: ?U/A: 0-2 RBC, few bacteria ?

## 2022-02-04 NOTE — Addendum Note (Signed)
Addended byIris Pert on: 02/04/2022 11:58 AM ? ? Modules accepted: Orders ? ?

## 2022-03-05 DIAGNOSIS — K219 Gastro-esophageal reflux disease without esophagitis: Secondary | ICD-10-CM | POA: Diagnosis not present

## 2022-03-05 DIAGNOSIS — R519 Headache, unspecified: Secondary | ICD-10-CM | POA: Diagnosis not present

## 2022-03-05 DIAGNOSIS — E039 Hypothyroidism, unspecified: Secondary | ICD-10-CM | POA: Diagnosis not present

## 2022-03-05 DIAGNOSIS — I10 Essential (primary) hypertension: Secondary | ICD-10-CM | POA: Diagnosis not present

## 2022-03-05 DIAGNOSIS — F329 Major depressive disorder, single episode, unspecified: Secondary | ICD-10-CM | POA: Diagnosis not present

## 2022-03-05 DIAGNOSIS — R0981 Nasal congestion: Secondary | ICD-10-CM | POA: Diagnosis not present

## 2022-03-05 DIAGNOSIS — J309 Allergic rhinitis, unspecified: Secondary | ICD-10-CM | POA: Diagnosis not present

## 2022-04-14 DIAGNOSIS — D72828 Other elevated white blood cell count: Secondary | ICD-10-CM | POA: Diagnosis not present

## 2022-04-14 DIAGNOSIS — E039 Hypothyroidism, unspecified: Secondary | ICD-10-CM | POA: Diagnosis not present

## 2022-04-14 DIAGNOSIS — R7301 Impaired fasting glucose: Secondary | ICD-10-CM | POA: Diagnosis not present

## 2022-04-22 DIAGNOSIS — F411 Generalized anxiety disorder: Secondary | ICD-10-CM | POA: Diagnosis not present

## 2022-04-22 DIAGNOSIS — Z86011 Personal history of benign neoplasm of the brain: Secondary | ICD-10-CM | POA: Diagnosis not present

## 2022-04-22 DIAGNOSIS — E878 Other disorders of electrolyte and fluid balance, not elsewhere classified: Secondary | ICD-10-CM | POA: Diagnosis not present

## 2022-04-22 DIAGNOSIS — F5101 Primary insomnia: Secondary | ICD-10-CM | POA: Diagnosis not present

## 2022-04-22 DIAGNOSIS — E039 Hypothyroidism, unspecified: Secondary | ICD-10-CM | POA: Diagnosis not present

## 2022-04-22 DIAGNOSIS — R270 Ataxia, unspecified: Secondary | ICD-10-CM | POA: Diagnosis not present

## 2022-04-22 DIAGNOSIS — K219 Gastro-esophageal reflux disease without esophagitis: Secondary | ICD-10-CM | POA: Diagnosis not present

## 2022-04-22 DIAGNOSIS — E782 Mixed hyperlipidemia: Secondary | ICD-10-CM | POA: Diagnosis not present

## 2022-04-22 DIAGNOSIS — R2689 Other abnormalities of gait and mobility: Secondary | ICD-10-CM | POA: Diagnosis not present

## 2022-04-22 DIAGNOSIS — J302 Other seasonal allergic rhinitis: Secondary | ICD-10-CM | POA: Diagnosis not present

## 2022-04-22 DIAGNOSIS — I10 Essential (primary) hypertension: Secondary | ICD-10-CM | POA: Diagnosis not present

## 2022-04-22 DIAGNOSIS — D72828 Other elevated white blood cell count: Secondary | ICD-10-CM | POA: Diagnosis not present

## 2022-06-03 DIAGNOSIS — E039 Hypothyroidism, unspecified: Secondary | ICD-10-CM | POA: Diagnosis not present

## 2022-06-17 DIAGNOSIS — R000 Tachycardia, unspecified: Secondary | ICD-10-CM | POA: Insufficient documentation

## 2022-06-17 DIAGNOSIS — Z716 Tobacco abuse counseling: Secondary | ICD-10-CM | POA: Diagnosis not present

## 2022-06-17 DIAGNOSIS — F17200 Nicotine dependence, unspecified, uncomplicated: Secondary | ICD-10-CM | POA: Diagnosis not present

## 2022-06-17 DIAGNOSIS — J019 Acute sinusitis, unspecified: Secondary | ICD-10-CM | POA: Diagnosis not present

## 2022-08-04 ENCOUNTER — Ambulatory Visit: Payer: Medicare Other | Admitting: Urology

## 2022-08-04 DIAGNOSIS — E039 Hypothyroidism, unspecified: Secondary | ICD-10-CM | POA: Diagnosis not present

## 2022-08-12 ENCOUNTER — Ambulatory Visit (INDEPENDENT_AMBULATORY_CARE_PROVIDER_SITE_OTHER): Payer: Medicare Other | Admitting: Urology

## 2022-08-12 ENCOUNTER — Encounter: Payer: Self-pay | Admitting: Urology

## 2022-08-12 VITALS — BP 138/64 | HR 106 | Ht 65.0 in | Wt 108.0 lb

## 2022-08-12 DIAGNOSIS — R3129 Other microscopic hematuria: Secondary | ICD-10-CM | POA: Diagnosis not present

## 2022-08-12 DIAGNOSIS — N952 Postmenopausal atrophic vaginitis: Secondary | ICD-10-CM

## 2022-08-12 DIAGNOSIS — Z87440 Personal history of urinary (tract) infections: Secondary | ICD-10-CM

## 2022-08-12 LAB — MICROSCOPIC EXAMINATION: Renal Epithel, UA: NONE SEEN /hpf

## 2022-08-12 LAB — URINALYSIS, ROUTINE W REFLEX MICROSCOPIC
Bilirubin, UA: NEGATIVE
Glucose, UA: NEGATIVE
Ketones, UA: NEGATIVE
Leukocytes,UA: NEGATIVE
Nitrite, UA: NEGATIVE
Protein,UA: NEGATIVE
Specific Gravity, UA: 1.01 (ref 1.005–1.030)
Urobilinogen, Ur: 0.2 mg/dL (ref 0.2–1.0)
pH, UA: 6 (ref 5.0–7.5)

## 2022-08-12 NOTE — Progress Notes (Signed)
Assessment: 1. Microscopic hematuria; negative evaluation 11/22   2. History of UTI   3. Atrophic vaginitis     Plan: Her microscopic hematuria remains stable.  She has previously had a negative evaluation in November 2022 which was negative for any serious or life-threatening causes.  Repeat evaluation is not indicated at this time. Continue vaginal hormone cream 2-3 times per week. Return to office in 6 months  Chief Complaint: Chief Complaint  Patient presents with   Hematuria    HPI: Catherine Gonzalez is a 71 y.o. female who presents for continued evaluation of microscopic hematuria, history of UTI's, and atrophic vaginitis.  She presented to the emergency room in 10/22 with vertigo and was diagnosed with a possible UTI.  She did report some dysuria and vaginal itching at that time.  She was treated with antibiotics.  Her symptoms did not improve.  She was again seen at urgent care and diagnosed with a vaginal yeast infection.  She tried over-the-counter medication without significant improvement.  A dipstick urinalysis from 09/11/2021 showed 3+ blood.  No history of gross hematuria.  She reported decreased dysuria and a decrease in the vaginal itching.  No flank pain.  She noted some discomfort at the start of urination.  She also reported some intermittent right lower quadrant discomfort which she rated as a 4/10.  No history of kidney stones.  She does have a remote history of UTI approximately 2 years ago.  CT imaging from 6/21 showed bilateral adrenal hyperplasia, 2 small cyst in the left kidney, no stones or hydronephrosis.   Urine culture from 1/20 grew >100 K. Proteus.  She was treated with Bactrim.   CT imaging from 09/23/2021 showed no renal or ureteral calculi, no renal masses, and no obstruction. Cystoscopy from 09/30/2021 showed no urethral or bladder abnormalities.  Pelvic exam showed atrophic vaginitis.  She was started on vaginal hormone cream and treated for vaginal  candidiasis with fluconazole x1.   At her visit in 12/22, she was doing very well.  She was not having any dysuria or vaginal irritation.  She was using the vaginal hormone cream 2-3 times per week without side effect.  No gross hematuria or flank pain.   At her visit in 3/23, she continued to use vaginal hormone cream 3 times per week.  She reported occasional dysuria.  No gross hematuria or flank pain.  She returns today for follow-up.  She has not had any episodes of gross hematuria.  She reports occasional dysuria.  She continues to use vaginal hormone cream 2-3 times per week.  No flank pain.  She has not been treated for any UTIs in the past 6 months.  Portions of the above documentation were copied from a prior visit for review purposes only.  Allergies: Allergies  Allergen Reactions   Codeine Nausea And Vomiting   Pegasys [Peginterferon Alfa-2a] Hives    PMH: Past Medical History:  Diagnosis Date   Allergic rhinitis    Anemia    Anxiety    Blood transfusion without reported diagnosis    Depression    Ectopic pregnancy    Finger fracture, left    index   GERD (gastroesophageal reflux disease)    Hepatitis C    Hypertension    Hypothyroidism    Seizures (HCC)     PSH: Past Surgical History:  Procedure Laterality Date   APPENDECTOMY     BRAIN SURGERY     x 3   BREAST ENHANCEMENT  SURGERY     CHOLECYSTECTOMY     CLOSED REDUCTION FINGER WITH PERCUTANEOUS PINNING Left 07/25/2020   Procedure: CLOSED REDUCTION FINGER WITH PERCUTANEOUS PINNING;  Surgeon: Daryll Brod, MD;  Location: [facility];  Service: Orthopedics;  Laterality: Left;  AXILLARY BLOCK   ECTOPIC PREGNANCY SURGERY     LUMBAR SPINE SURGERY     TONSILLECTOMY AND ADENOIDECTOMY      SH: Social History   Tobacco Use   Smoking status: Every Day    Packs/day: 0.25    Years: 40.00    Total pack years: 10.00    Types: Cigarettes   Smokeless tobacco: Never  Vaping Use   Vaping Use: Never  used  Substance Use Topics   Alcohol use: Yes    Comment: OCC   Drug use: No   ROS: Constitutional:  Negative for fever, chills, weight loss CV: Negative for chest pain, previous MI, hypertension Respiratory:  Negative for shortness of breath, wheezing, sleep apnea, frequent cough GI:  Negative for nausea, vomiting, bloody stool, GERD  PE: BP 138/64   Pulse (!) 106   Ht '5\' 5"'$  (1.651 m)   Wt 108 lb (49 kg)   BMI 17.97 kg/m  GENERAL APPEARANCE:  Well appearing, well developed, well nourished, NAD HEENT:  Atraumatic, normocephalic, oropharynx clear NECK:  Supple without lymphadenopathy or thyromegaly ABDOMEN:  Soft, non-tender, no masses EXTREMITIES:  Moves all extremities well, without clubbing, cyanosis, or edema NEUROLOGIC:  Alert and oriented x 3, normal gait, CN II-XII grossly intact MENTAL STATUS:  appropriate BACK:  Non-tender to palpation, No CVAT SKIN:  Warm, dry, and intact  Results: U/A: 0-5 WBCs, 3-10 RBCs, few bacteria

## 2022-08-29 DIAGNOSIS — R519 Headache, unspecified: Secondary | ICD-10-CM | POA: Diagnosis not present

## 2022-08-29 DIAGNOSIS — J302 Other seasonal allergic rhinitis: Secondary | ICD-10-CM | POA: Diagnosis not present

## 2022-08-29 DIAGNOSIS — J019 Acute sinusitis, unspecified: Secondary | ICD-10-CM | POA: Diagnosis not present

## 2022-08-29 DIAGNOSIS — R0981 Nasal congestion: Secondary | ICD-10-CM | POA: Diagnosis not present

## 2022-09-20 DIAGNOSIS — Z23 Encounter for immunization: Secondary | ICD-10-CM | POA: Diagnosis not present

## 2022-10-19 DIAGNOSIS — E782 Mixed hyperlipidemia: Secondary | ICD-10-CM | POA: Diagnosis not present

## 2022-10-26 DIAGNOSIS — E878 Other disorders of electrolyte and fluid balance, not elsewhere classified: Secondary | ICD-10-CM | POA: Diagnosis not present

## 2022-10-26 DIAGNOSIS — J302 Other seasonal allergic rhinitis: Secondary | ICD-10-CM | POA: Diagnosis not present

## 2022-10-26 DIAGNOSIS — D72828 Other elevated white blood cell count: Secondary | ICD-10-CM | POA: Diagnosis not present

## 2022-10-26 DIAGNOSIS — R300 Dysuria: Secondary | ICD-10-CM | POA: Diagnosis not present

## 2022-10-26 DIAGNOSIS — E039 Hypothyroidism, unspecified: Secondary | ICD-10-CM | POA: Diagnosis not present

## 2022-10-26 DIAGNOSIS — R2689 Other abnormalities of gait and mobility: Secondary | ICD-10-CM | POA: Diagnosis not present

## 2022-10-26 DIAGNOSIS — K219 Gastro-esophageal reflux disease without esophagitis: Secondary | ICD-10-CM | POA: Diagnosis not present

## 2022-10-26 DIAGNOSIS — R270 Ataxia, unspecified: Secondary | ICD-10-CM | POA: Diagnosis not present

## 2022-10-26 DIAGNOSIS — I10 Essential (primary) hypertension: Secondary | ICD-10-CM | POA: Diagnosis not present

## 2022-10-26 DIAGNOSIS — F411 Generalized anxiety disorder: Secondary | ICD-10-CM | POA: Diagnosis not present

## 2022-10-26 DIAGNOSIS — E782 Mixed hyperlipidemia: Secondary | ICD-10-CM | POA: Diagnosis not present

## 2022-10-26 DIAGNOSIS — Z86011 Personal history of benign neoplasm of the brain: Secondary | ICD-10-CM | POA: Diagnosis not present

## 2022-12-02 DIAGNOSIS — F17200 Nicotine dependence, unspecified, uncomplicated: Secondary | ICD-10-CM | POA: Diagnosis not present

## 2022-12-02 DIAGNOSIS — F17210 Nicotine dependence, cigarettes, uncomplicated: Secondary | ICD-10-CM | POA: Diagnosis not present

## 2022-12-02 DIAGNOSIS — J0100 Acute maxillary sinusitis, unspecified: Secondary | ICD-10-CM | POA: Diagnosis not present

## 2023-01-01 ENCOUNTER — Other Ambulatory Visit: Payer: Self-pay | Admitting: Urology

## 2023-01-01 DIAGNOSIS — N952 Postmenopausal atrophic vaginitis: Secondary | ICD-10-CM

## 2023-02-17 ENCOUNTER — Ambulatory Visit (INDEPENDENT_AMBULATORY_CARE_PROVIDER_SITE_OTHER): Payer: Medicare Other | Admitting: Urology

## 2023-02-17 ENCOUNTER — Encounter: Payer: Self-pay | Admitting: Urology

## 2023-02-17 DIAGNOSIS — N952 Postmenopausal atrophic vaginitis: Secondary | ICD-10-CM

## 2023-02-17 DIAGNOSIS — R3129 Other microscopic hematuria: Secondary | ICD-10-CM

## 2023-02-17 LAB — URINALYSIS, ROUTINE W REFLEX MICROSCOPIC
Bilirubin, UA: NEGATIVE
Glucose, UA: NEGATIVE
Ketones, UA: NEGATIVE
Leukocytes,UA: NEGATIVE
Nitrite, UA: NEGATIVE
Protein,UA: NEGATIVE
Specific Gravity, UA: 1.02 (ref 1.005–1.030)
Urobilinogen, Ur: 0.2 mg/dL (ref 0.2–1.0)
pH, UA: 7.5 (ref 5.0–7.5)

## 2023-02-17 LAB — MICROSCOPIC EXAMINATION

## 2023-02-17 MED ORDER — ESTRADIOL 0.1 MG/GM VA CREA: TOPICAL_CREAM | VAGINAL | 12 refills | Status: DC

## 2023-02-17 NOTE — Patient Instructions (Signed)

## 2023-02-17 NOTE — Progress Notes (Unsigned)
02/17/2023 3:15 PM   Catherine Gonzalez 1951-04-22 [id number]  Referring provider: Celene Squibb, MD [place],  [place] [address]  Followup microhematuria   HPI: No gross hematuria since last. She uses estrogen cream 3x per week for atrophic vaginitis.    PMH: Past Medical History:  Diagnosis Date   Allergic rhinitis    Anemia    Anxiety    Blood transfusion without reported diagnosis    Depression    Ectopic pregnancy    Finger fracture, left    index   GERD (gastroesophageal reflux disease)    Hepatitis C    Hypertension    Hypothyroidism    Seizures ([facility])     Surgical History: Past Surgical History:  Procedure Laterality Date   APPENDECTOMY     BRAIN SURGERY     x 3   BREAST ENHANCEMENT SURGERY     CHOLECYSTECTOMY     CLOSED REDUCTION FINGER WITH PERCUTANEOUS PINNING Left 07/25/2020   Procedure: CLOSED REDUCTION FINGER WITH PERCUTANEOUS PINNING;  Surgeon: Daryll Brod, MD;  Location: [facility];  Service: Orthopedics;  Laterality: Left;  AXILLARY BLOCK   ECTOPIC PREGNANCY SURGERY     LUMBAR SPINE SURGERY     TONSILLECTOMY AND ADENOIDECTOMY      Home Medications:  Allergies as of 02/17/2023       Reactions   Codeine Nausea And Vomiting   Pegasys [peginterferon Alfa-2a] Hives        Medication List        Accurate as of February 17, 2023  3:15 PM. If you have any questions, ask your nurse or doctor.          amLODipine 5 MG tablet Commonly known as: NORVASC Take 5 mg by mouth daily.   busPIRone 5 MG tablet Commonly known as: BUSPAR Take 5 mg by mouth 2 (two) times daily.   estradiol 0.1 MG/GM vaginal cream Commonly known as: ESTRACE APPLY 1 GRAM VAGINALLY 2 TO 3 TIMES PER WEEK   fluticasone 50 MCG/ACT nasal spray Commonly known as: FLONASE Place 2 sprays into both nostrils daily.   hydrOXYzine 25 MG tablet Commonly known as: ATARAX Take 25 mg by mouth 2 (two) times daily as needed.   levETIRAcetam 750  MG tablet Commonly known as: KEPPRA TAKE 1 TABLET TWICE A DAY What changed:  how much to take how to take this when to take this additional instructions   levothyroxine 75 MCG tablet Commonly known as: SYNTHROID Take 75 mcg by mouth daily before breakfast. TAKE 1 1/2 TABS DAILY   meclizine 25 MG tablet Commonly known as: ANTIVERT Take 1 tablet (25 mg total) by mouth 3 (three) times daily as needed for dizziness.   olmesartan 20 MG tablet Commonly known as: BENICAR Take 20 mg by mouth daily.   ondansetron 4 MG disintegrating tablet Commonly known as: Zofran ODT Take 1 tablet (4 mg total) by mouth every 8 (eight) hours as needed for nausea or vomiting.   sodium bicarbonate 325 MG tablet Take 650 mg by mouth 4 (four) times daily -  with meals and at bedtime.   traMADol 50 MG tablet Commonly known as: ULTRAM Take 1 tablet (50 mg total) by mouth 2 (two) times daily.   traMADol 50 MG tablet Commonly known as: Ultram Take 1 tablet (50 mg total) by mouth every 6 (six) hours as needed.   traZODone 50 MG tablet Commonly known as: DESYREL TAKE 1 TABLET BY MOUTH  AT BEDTIME FOR SLEEP        Allergies:  Allergies  Allergen Reactions   Codeine Nausea And Vomiting   Pegasys [Peginterferon Alfa-2a] Hives    Family History: Family History  Problem Relation Age of Onset   Heart failure Mother    Lung cancer Father    Alcohol abuse Brother    Leukemia Brother    Colon cancer Neg Hx     Social History:  reports that she has been smoking cigarettes. She has a 10.00 pack-year smoking history. She has never used smokeless tobacco. She reports current alcohol use. She reports that she does not use drugs.  ROS: All other review of systems were reviewed and are negative except what is noted above in HPI  Physical Exam: There were no vitals taken for this visit.  Constitutional:  Alert and oriented, No acute distress. HEENT: [place] AT, moist mucus membranes.  Trachea midline, no  masses. Cardiovascular: No clubbing, cyanosis, or edema. Respiratory: Normal respiratory effort, no increased work of breathing. GI: Abdomen is soft, nontender, nondistended, no abdominal masses GU: No CVA tenderness.  Lymph: No cervical or inguinal lymphadenopathy. Skin: No rashes, bruises or suspicious lesions. Neurologic: Grossly intact, no focal deficits, moving all 4 extremities. Psychiatric: Normal mood and affect.  Laboratory Data: Lab Results  Component Value Date   WBC 11.6 (H) 07/29/2021   HGB 14.0 07/29/2021   HCT 40.8 07/29/2021   MCV 95.3 07/29/2021   PLT 314 07/29/2021    Lab Results  Component Value Date   CREATININE 0.90 09/23/2021    No results found for: "PSA"  No results found for: "TESTOSTERONE"  No results found for: "HGBA1C"  Urinalysis    Component Value Date/Time   COLORURINE STRAW (A) 05/18/2020 1427   APPEARANCEUR Clear 08/12/2022 1104   LABSPEC 1.042 (H) 05/18/2020 1427   PHURINE 6.0 05/18/2020 1427   GLUCOSEU Negative 08/12/2022 1104   HGBUR SMALL (A) 05/18/2020 1427   BILIRUBINUR Negative 08/12/2022 1104   KETONESUR negative 09/03/2021 1147   KETONESUR 5 (A) 05/18/2020 1427   PROTEINUR Negative 08/12/2022 1104   PROTEINUR NEGATIVE 05/18/2020 1427   UROBILINOGEN 0.2 09/03/2021 1147   UROBILINOGEN 0.2 03/20/2013 0805   NITRITE Negative 08/12/2022 1104   NITRITE NEGATIVE 05/18/2020 1427   LEUKOCYTESUR Negative 08/12/2022 1104   LEUKOCYTESUR NEGATIVE 05/18/2020 1427    Lab Results  Component Value Date   LABMICR See below: 08/12/2022   WBCUA 0-5 08/12/2022   LABEPIT 0-10 08/12/2022   MUCUS Present 11/06/2021   BACTERIA Few (A) 08/12/2022    Pertinent Imaging: *** No results found for this or any previous visit.  No results found for this or any previous visit.  No results found for this or any previous visit.  No results found for this or any previous visit.  No results found for this or any previous visit.  No valid  procedures specified. Results for orders placed during the hospital encounter of 09/23/21  CT HEMATURIA WORKUP  Narrative CLINICAL DATA:  Microscopic hematuria, frequent UTIs  EXAM: CT ABDOMEN AND PELVIS WITHOUT AND WITH CONTRAST  TECHNIQUE: Multidetector CT imaging of the abdomen and pelvis was performed following the standard protocol before and following the bolus administration of intravenous contrast.  CONTRAST:  11mL OMNIPAQUE IOHEXOL 350 MG/ML SOLN  COMPARISON:  05/18/2020  FINDINGS: Lower chest: No acute abnormality.  Coronary artery calcification.  Hepatobiliary: No focal liver abnormality is seen. Status post cholecystectomy. No biliary dilatation.  Pancreas: Unremarkable. No pancreatic  ductal dilatation or surrounding inflammatory changes.  Spleen: Normal in size without significant abnormality.  Adrenals/Urinary Tract: Adrenal glands are unremarkable. Kidneys are normal, without renal calculi, solid lesion, or hydronephrosis. No urinary tract filling defect on delayed phase imaging. Bladder is unremarkable.  Stomach/Bowel: Stomach is within normal limits. Appendix is not clearly visualized. No evidence of bowel wall thickening, distention, or inflammatory changes. Sigmoid diverticula.  Vascular/Lymphatic: Aortic atherosclerosis. No enlarged abdominal or pelvic lymph nodes.  Reproductive: No mass or other significant abnormality.  Other: No abdominal wall hernia or abnormality. No abdominopelvic ascites.  Musculoskeletal: No acute or significant osseous findings.  IMPRESSION: 1. No CT findings to explain hematuria. No evidence of urinary tract calculus, mass, or hydronephrosis. No urinary tract filling defect on delayed phase imaging. 2. Sigmoid diverticulosis without evidence for diverticulitis. 3. Coronary artery disease.  Aortic Atherosclerosis (ICD10-I70.0).   Electronically Signed By: Delanna Ahmadi M.D. On: 09/25/2021 08:51  No results  found for this or any previous visit.   Assessment & Plan:    1. Microscopic hematuria ***  2. Atrophic vaginitis *** - Urinalysis, Routine w reflex microscopic   No follow-ups on file.  Nicolette Bang, MD  [facility]

## 2023-02-18 ENCOUNTER — Encounter: Payer: Self-pay | Admitting: Urology

## 2023-03-27 ENCOUNTER — Other Ambulatory Visit: Payer: Self-pay

## 2023-03-27 ENCOUNTER — Encounter (HOSPITAL_COMMUNITY): Payer: Self-pay | Admitting: Emergency Medicine

## 2023-03-27 ENCOUNTER — Emergency Department (HOSPITAL_COMMUNITY): Payer: Medicare Other

## 2023-03-27 ENCOUNTER — Inpatient Hospital Stay (HOSPITAL_COMMUNITY)
Admission: EM | Admit: 2023-03-27 | Discharge: 2023-03-31 | DRG: 535 | Disposition: A | Discharge status: Skilled Nursing Facility | Payer: Medicare Other | Attending: Family Medicine | Admitting: Family Medicine

## 2023-03-27 ENCOUNTER — Observation Stay (HOSPITAL_COMMUNITY): Payer: Medicare Other

## 2023-03-27 DIAGNOSIS — S3282XA Multiple fractures of pelvis without disruption of pelvic ring, initial encounter for closed fracture: Secondary | ICD-10-CM | POA: Diagnosis not present

## 2023-03-27 DIAGNOSIS — D72829 Elevated white blood cell count, unspecified: Secondary | ICD-10-CM

## 2023-03-27 DIAGNOSIS — D62 Acute posthemorrhagic anemia: Secondary | ICD-10-CM | POA: Diagnosis not present

## 2023-03-27 DIAGNOSIS — R569 Unspecified convulsions: Secondary | ICD-10-CM | POA: Diagnosis present

## 2023-03-27 DIAGNOSIS — K219 Gastro-esophageal reflux disease without esophagitis: Secondary | ICD-10-CM | POA: Diagnosis present

## 2023-03-27 DIAGNOSIS — D638 Anemia in other chronic diseases classified elsewhere: Secondary | ICD-10-CM | POA: Diagnosis not present

## 2023-03-27 DIAGNOSIS — M5489 Other dorsalgia: Secondary | ICD-10-CM | POA: Diagnosis not present

## 2023-03-27 DIAGNOSIS — E876 Hypokalemia: Secondary | ICD-10-CM | POA: Diagnosis present

## 2023-03-27 DIAGNOSIS — Z79899 Other long term (current) drug therapy: Secondary | ICD-10-CM

## 2023-03-27 DIAGNOSIS — R000 Tachycardia, unspecified: Secondary | ICD-10-CM | POA: Diagnosis not present

## 2023-03-27 DIAGNOSIS — Z801 Family history of malignant neoplasm of trachea, bronchus and lung: Secondary | ICD-10-CM

## 2023-03-27 DIAGNOSIS — J9601 Acute respiratory failure with hypoxia: Secondary | ICD-10-CM | POA: Diagnosis present

## 2023-03-27 DIAGNOSIS — J309 Allergic rhinitis, unspecified: Secondary | ICD-10-CM | POA: Diagnosis present

## 2023-03-27 DIAGNOSIS — R079 Chest pain, unspecified: Secondary | ICD-10-CM | POA: Diagnosis not present

## 2023-03-27 DIAGNOSIS — S329XXA Fracture of unspecified parts of lumbosacral spine and pelvis, initial encounter for closed fracture: Secondary | ICD-10-CM | POA: Diagnosis present

## 2023-03-27 DIAGNOSIS — S32591A Other specified fracture of right pubis, initial encounter for closed fracture: Secondary | ICD-10-CM | POA: Diagnosis not present

## 2023-03-27 DIAGNOSIS — F32A Depression, unspecified: Secondary | ICD-10-CM | POA: Insufficient documentation

## 2023-03-27 DIAGNOSIS — S300XXA Contusion of lower back and pelvis, initial encounter: Secondary | ICD-10-CM | POA: Diagnosis present

## 2023-03-27 DIAGNOSIS — I959 Hypotension, unspecified: Secondary | ICD-10-CM | POA: Diagnosis present

## 2023-03-27 DIAGNOSIS — E878 Other disorders of electrolyte and fluid balance, not elsewhere classified: Secondary | ICD-10-CM | POA: Diagnosis present

## 2023-03-27 DIAGNOSIS — J449 Chronic obstructive pulmonary disease, unspecified: Secondary | ICD-10-CM | POA: Diagnosis present

## 2023-03-27 DIAGNOSIS — Z9049 Acquired absence of other specified parts of digestive tract: Secondary | ICD-10-CM

## 2023-03-27 DIAGNOSIS — F419 Anxiety disorder, unspecified: Secondary | ICD-10-CM | POA: Diagnosis present

## 2023-03-27 DIAGNOSIS — W010XXA Fall on same level from slipping, tripping and stumbling without subsequent striking against object, initial encounter: Secondary | ICD-10-CM | POA: Diagnosis present

## 2023-03-27 DIAGNOSIS — F17210 Nicotine dependence, cigarettes, uncomplicated: Secondary | ICD-10-CM | POA: Diagnosis present

## 2023-03-27 DIAGNOSIS — G8194 Hemiplegia, unspecified affecting left nondominant side: Secondary | ICD-10-CM | POA: Diagnosis not present

## 2023-03-27 DIAGNOSIS — S32511A Fracture of superior rim of right pubis, initial encounter for closed fracture: Secondary | ICD-10-CM | POA: Diagnosis not present

## 2023-03-27 DIAGNOSIS — Z888 Allergy status to other drugs, medicaments and biological substances status: Secondary | ICD-10-CM

## 2023-03-27 DIAGNOSIS — Z8249 Family history of ischemic heart disease and other diseases of the circulatory system: Secondary | ICD-10-CM

## 2023-03-27 DIAGNOSIS — Z885 Allergy status to narcotic agent status: Secondary | ICD-10-CM

## 2023-03-27 DIAGNOSIS — E039 Hypothyroidism, unspecified: Secondary | ICD-10-CM | POA: Diagnosis present

## 2023-03-27 DIAGNOSIS — Z86011 Personal history of benign neoplasm of the brain: Secondary | ICD-10-CM

## 2023-03-27 DIAGNOSIS — Z79890 Hormone replacement therapy (postmenopausal): Secondary | ICD-10-CM

## 2023-03-27 DIAGNOSIS — M1611 Unilateral primary osteoarthritis, right hip: Secondary | ICD-10-CM | POA: Diagnosis not present

## 2023-03-27 DIAGNOSIS — R262 Difficulty in walking, not elsewhere classified: Secondary | ICD-10-CM | POA: Diagnosis present

## 2023-03-27 DIAGNOSIS — S32409A Unspecified fracture of unspecified acetabulum, initial encounter for closed fracture: Secondary | ICD-10-CM | POA: Diagnosis not present

## 2023-03-27 DIAGNOSIS — Y92000 Kitchen of unspecified non-institutional (private) residence as  the place of occurrence of the external cause: Secondary | ICD-10-CM

## 2023-03-27 DIAGNOSIS — I10 Essential (primary) hypertension: Secondary | ICD-10-CM | POA: Diagnosis present

## 2023-03-27 LAB — URINALYSIS, ROUTINE W REFLEX MICROSCOPIC
Bilirubin Urine: NEGATIVE
Glucose, UA: NEGATIVE mg/dL
Hgb urine dipstick: NEGATIVE
Ketones, ur: 20 mg/dL — AB
Leukocytes,Ua: NEGATIVE
Nitrite: NEGATIVE
Protein, ur: NEGATIVE mg/dL
Specific Gravity, Urine: 1.01 (ref 1.005–1.030)
pH: 7 (ref 5.0–8.0)

## 2023-03-27 LAB — BASIC METABOLIC PANEL
Anion gap: 12 (ref 5–15)
BUN: 19 mg/dL (ref 8–23)
CO2: 28 mmol/L (ref 22–32)
Calcium: 9.3 mg/dL (ref 8.9–10.3)
Chloride: 95 mmol/L — ABNORMAL LOW (ref 98–111)
Creatinine, Ser: 0.78 mg/dL (ref 0.44–1.00)
GFR, Estimated: >60 mL/min (ref >60)
Glucose, Bld: 106 mg/dL — ABNORMAL HIGH (ref 70–99)
Potassium: 3.3 mmol/L — ABNORMAL LOW (ref 3.5–5.1)
Sodium: 135 mmol/L (ref 135–145)

## 2023-03-27 LAB — CBC WITH DIFFERENTIAL/PLATELET
Abs Immature Granulocytes: 0.12 10*3/uL — ABNORMAL HIGH (ref 0.00–0.07)
Basophils Absolute: 0.1 10*3/uL (ref 0.0–0.1)
Basophils Relative: 0 %
Eosinophils Absolute: 0 10*3/uL (ref 0.0–0.5)
Eosinophils Relative: 0 %
HCT: 39.8 % (ref 36.0–46.0)
Hemoglobin: 13.7 g/dL (ref 12.0–15.0)
Immature Granulocytes: 1 %
Lymphocytes Relative: 6 %
Lymphs Abs: 1.3 10*3/uL (ref 0.7–4.0)
MCH: 31.2 pg (ref 26.0–34.0)
MCHC: 34.4 g/dL (ref 30.0–36.0)
MCV: 90.7 fL (ref 80.0–100.0)
Monocytes Absolute: 1.5 10*3/uL — ABNORMAL HIGH (ref 0.1–1.0)
Monocytes Relative: 7 %
Neutro Abs: 18.6 10*3/uL — ABNORMAL HIGH (ref 1.7–7.7)
Neutrophils Relative %: 86 %
Platelets: 285 10*3/uL (ref 150–400)
RBC: 4.39 MIL/uL (ref 3.87–5.11)
RDW: 12.7 % (ref 11.5–15.5)
WBC: 21.6 10*3/uL — ABNORMAL HIGH (ref 4.0–10.5)
nRBC: 0 % (ref 0.0–0.2)

## 2023-03-27 LAB — CK TOTAL AND CKMB (NOT AT ARMC): Total CK: 109 U/L (ref 38–234)

## 2023-03-27 LAB — C-REACTIVE PROTEIN: CRP: 0.5 mg/dL (ref <1.0)

## 2023-03-27 LAB — TSH: TSH: 1.076 u[IU]/mL (ref 0.350–4.500)

## 2023-03-27 LAB — SEDIMENTATION RATE: Sed Rate: 5 mm/hr (ref 0–22)

## 2023-03-27 LAB — TYPE AND SCREEN: Antibody Screen: NEGATIVE

## 2023-03-27 LAB — PROTIME-INR
INR: 1 (ref 0.8–1.2)
Prothrombin Time: 13.1 seconds (ref 11.4–15.2)

## 2023-03-27 LAB — MAGNESIUM: Magnesium: 1.6 mg/dL — ABNORMAL LOW (ref 1.7–2.4)

## 2023-03-27 LAB — PHOSPHORUS: Phosphorus: 2.9 mg/dL (ref 2.5–4.6)

## 2023-03-27 MED ORDER — SENNOSIDES-DOCUSATE SODIUM 8.6-50 MG PO TABS: 1.0000 | ORAL_TABLET | Freq: Every evening | ORAL | Status: DC | PRN

## 2023-03-27 MED ORDER — BUSPIRONE HCL 5 MG PO TABS
5.0000 mg | ORAL_TABLET | Freq: Two times a day (BID) | ORAL | Status: DC
  Administered 2023-03-27 – 2023-03-31 (×8): 5 mg via ORAL
  Filled 2023-03-27 (×9): qty 1

## 2023-03-27 MED ORDER — FLUTICASONE PROPIONATE 50 MCG/ACT NA SUSP
2.0000 | Freq: Every day | NASAL | Status: DC
  Administered 2023-03-29 – 2023-03-31 (×3): 2 via NASAL

## 2023-03-27 MED ORDER — FLEET ENEMA 7-19 GM/118ML RE ENEM: 1.0000 | ENEMA | Freq: Once | RECTAL | Status: DC | PRN

## 2023-03-27 MED ORDER — ENOXAPARIN SODIUM 40 MG/0.4ML IJ SOSY
40.0000 mg | PREFILLED_SYRINGE | INTRAMUSCULAR | Status: DC
  Administered 2023-03-27 – 2023-03-28 (×2): 40 mg via SUBCUTANEOUS
  Filled 2023-03-27 (×2): qty 0.4

## 2023-03-27 MED ORDER — HYDRALAZINE HCL 20 MG/ML IJ SOLN: 10.0000 mg | INTRAMUSCULAR | Status: DC | PRN

## 2023-03-27 MED ORDER — TRAZODONE HCL 50 MG PO TABS
25.0000 mg | ORAL_TABLET | Freq: Every evening | ORAL | Status: DC | PRN
  Administered 2023-03-27 – 2023-03-28 (×2): 25 mg via ORAL
  Filled 2023-03-27 (×3): qty 1

## 2023-03-27 MED ORDER — FLUTICASONE PROPIONATE 50 MCG/ACT NA SUSP
1.0000 | Freq: Every day | NASAL | Status: DC
  Administered 2023-03-27 – 2023-03-28 (×2): 1 via NASAL
  Filled 2023-03-27: qty 16

## 2023-03-27 MED ORDER — SODIUM CHLORIDE 0.9 % IV SOLN: 250.0000 mL | INTRAVENOUS | Status: DC | PRN

## 2023-03-27 MED ORDER — ENOXAPARIN SODIUM 40 MG/0.4ML IJ SOSY: 40.0000 mg | PREFILLED_SYRINGE | INTRAMUSCULAR | Status: DC

## 2023-03-27 MED ORDER — AMLODIPINE BESYLATE 5 MG PO TABS
5.0000 mg | ORAL_TABLET | Freq: Every day | ORAL | Status: DC
  Filled 2023-03-27 (×2): qty 1

## 2023-03-27 MED ORDER — LEVETIRACETAM 500 MG PO TABS
750.0000 mg | ORAL_TABLET | Freq: Two times a day (BID) | ORAL | Status: DC
  Administered 2023-03-27 – 2023-03-31 (×8): 750 mg via ORAL
  Filled 2023-03-27 (×8): qty 1

## 2023-03-27 MED ORDER — TRAMADOL HCL 50 MG PO TABS
50.0000 mg | ORAL_TABLET | Freq: Two times a day (BID) | ORAL | Status: DC
  Administered 2023-03-27 – 2023-03-29 (×4): 50 mg via ORAL
  Filled 2023-03-27 (×6): qty 1

## 2023-03-27 MED ORDER — SODIUM CHLORIDE 0.9% FLUSH
3.0000 mL | Freq: Two times a day (BID) | INTRAVENOUS | Status: DC
  Administered 2023-03-27 – 2023-03-31 (×9): 3 mL via INTRAVENOUS

## 2023-03-27 MED ORDER — SODIUM CHLORIDE 0.9 % IV SOLN: INTRAVENOUS | Status: DC

## 2023-03-27 MED ORDER — ACETAMINOPHEN 650 MG RE SUPP: 650.0000 mg | Freq: Four times a day (QID) | RECTAL | Status: DC | PRN

## 2023-03-27 MED ORDER — ACETAMINOPHEN 325 MG PO TABS: 650.0000 mg | ORAL_TABLET | Freq: Four times a day (QID) | ORAL | Status: DC | PRN

## 2023-03-27 MED ORDER — IPRATROPIUM BROMIDE 0.02 % IN SOLN: 0.5000 mg | Freq: Four times a day (QID) | RESPIRATORY_TRACT | Status: DC | PRN

## 2023-03-27 MED ORDER — ONDANSETRON HCL 4 MG PO TABS
4.0000 mg | ORAL_TABLET | Freq: Four times a day (QID) | ORAL | Status: DC | PRN
  Administered 2023-03-28 – 2023-03-29 (×3): 4 mg via ORAL
  Filled 2023-03-27 (×4): qty 1

## 2023-03-27 MED ORDER — HYDROXYZINE HCL 25 MG PO TABS
25.0000 mg | ORAL_TABLET | Freq: Two times a day (BID) | ORAL | Status: DC | PRN
  Administered 2023-03-27 – 2023-03-28 (×2): 25 mg via ORAL
  Filled 2023-03-27 (×2): qty 1

## 2023-03-27 MED ORDER — LEVOTHYROXINE SODIUM 75 MCG PO TABS
75.0000 ug | ORAL_TABLET | Freq: Every day | ORAL | Status: DC
  Administered 2023-03-28 – 2023-03-31 (×4): 75 ug via ORAL
  Filled 2023-03-27 (×4): qty 1

## 2023-03-27 MED ORDER — HYDROMORPHONE HCL 1 MG/ML IJ SOLN
1.0000 mg | INTRAMUSCULAR | Status: DC | PRN
  Administered 2023-03-27 – 2023-03-31 (×10): 1 mg via INTRAVENOUS
  Filled 2023-03-27 (×10): qty 1

## 2023-03-27 MED ORDER — POTASSIUM CHLORIDE CRYS ER 20 MEQ PO TBCR
40.0000 meq | EXTENDED_RELEASE_TABLET | Freq: Once | ORAL | Status: AC
  Administered 2023-03-27: 40 meq via ORAL
  Filled 2023-03-27: qty 2

## 2023-03-27 MED ORDER — FENTANYL CITRATE PF 50 MCG/ML IJ SOSY
50.0000 ug | PREFILLED_SYRINGE | INTRAMUSCULAR | Status: AC | PRN
  Administered 2023-03-27 (×2): 50 ug via INTRAVENOUS
  Filled 2023-03-27 (×2): qty 1

## 2023-03-27 MED ORDER — BISACODYL 5 MG PO TBEC: 5.0000 mg | DELAYED_RELEASE_TABLET | Freq: Every day | ORAL | Status: DC | PRN

## 2023-03-27 MED ORDER — SODIUM CHLORIDE 0.9% FLUSH: 3.0000 mL | INTRAVENOUS | Status: DC | PRN

## 2023-03-27 MED ORDER — OXYCODONE HCL 5 MG PO TABS
5.0000 mg | ORAL_TABLET | ORAL | Status: DC | PRN
  Administered 2023-03-27 – 2023-03-31 (×9): 5 mg via ORAL
  Filled 2023-03-27 (×10): qty 1

## 2023-03-27 MED ORDER — LEVALBUTEROL HCL 0.63 MG/3ML IN NEBU: 0.6300 mg | INHALATION_SOLUTION | Freq: Four times a day (QID) | RESPIRATORY_TRACT | Status: DC | PRN

## 2023-03-27 MED ORDER — SODIUM CHLORIDE 0.9% FLUSH
3.0000 mL | Freq: Two times a day (BID) | INTRAVENOUS | Status: DC
  Administered 2023-03-27 (×2): 3 mL via INTRAVENOUS

## 2023-03-27 MED ORDER — ONDANSETRON HCL 4 MG/2ML IJ SOLN
4.0000 mg | Freq: Four times a day (QID) | INTRAMUSCULAR | Status: DC | PRN
  Administered 2023-03-28: 4 mg via INTRAVENOUS
  Filled 2023-03-27: qty 2

## 2023-03-27 NOTE — ED Provider Notes (Signed)
[place] EMERGENCY DEPARTMENT AT [facility] Provider Note   CSN: [id number] Arrival date & time: 03/27/23  1240     History  Chief Complaint  Patient presents with   Mariann Laster is a 72 y.o. female.   Fall   This patient is a 72 year old female, she has a history of hypertension on amlodipine, she has thyroid disorder, she presents to the hospital today with a complaint of a fall that occurred in her kitchen.  She reports having a brain tumor in her years approximately 40 or 50 years ago and since that time has had difficulty with her left arm and somewhat with her left leg.  She walks with a cane occasionally but not usually.  She presents today with pain in her right groin after having a fall in her kitchen where she states that she lost her balance.  She denies hitting her head and states that she was able to get up off of the ground but with significant pain and discomfort in her right groin and buttock area.    Home Medications Prior to Admission medications   Medication Sig Start Date End Date Taking? Authorizing Provider  amLODipine (NORVASC) 5 MG tablet Take 5 mg by mouth daily.    [provider]  busPIRone (BUSPAR) 5 MG tablet Take 5 mg by mouth 2 (two) times daily. 01/26/22   [provider]  estradiol (ESTRACE) 0.1 MG/GM vaginal cream APPLY 1 GRAM VAGINALLY 5 TIMES PER WEEK 02/17/23   McKenzie, Mardene Celeste, MD  fluticasone (FLONASE) 50 MCG/ACT nasal spray Place 2 sprays into both nostrils daily. 05/18/20   Henderly, Britni A, PA-C  hydrOXYzine (ATARAX/VISTARIL) 25 MG tablet Take 25 mg by mouth 2 (two) times daily as needed. Patient not taking: Reported on 02/17/2023 06/05/20   [provider]  levETIRAcetam (KEPPRA) 750 MG tablet TAKE 1 TABLET TWICE A DAY Patient taking differently: Take 750 mg by mouth 2 (two) times daily. 04/11/14   Donita Brooks, MD  levothyroxine (SYNTHROID) 75 MCG tablet Take 75 mcg by mouth daily  before breakfast. TAKE 1 1/2 TABS DAILY    [provider]  meclizine (ANTIVERT) 25 MG tablet Take 1 tablet (25 mg total) by mouth 3 (three) times daily as needed for dizziness. Patient not taking: Reported on 02/17/2023 07/29/21   Pricilla Loveless, MD  olmesartan (BENICAR) 20 MG tablet Take 20 mg by mouth daily.    [provider]  ondansetron (ZOFRAN ODT) 4 MG disintegrating tablet Take 1 tablet (4 mg total) by mouth every 8 (eight) hours as needed for nausea or vomiting. Patient not taking: Reported on 02/17/2023 05/18/20   Henderly, Britni A, PA-C  sodium bicarbonate 325 MG tablet Take 650 mg by mouth 4 (four) times daily -  with meals and at bedtime.     [provider]  traMADol (ULTRAM) 50 MG tablet Take 1 tablet (50 mg total) by mouth 2 (two) times daily. Patient not taking: Reported on 02/17/2023 07/17/20   Durward Parcel, FNP  traMADol (ULTRAM) 50 MG tablet Take 1 tablet (50 mg total) by mouth every 6 (six) hours as needed. Patient not taking: Reported on 02/17/2023 07/25/20   Cindee Salt, MD  traZODone (DESYREL) 50 MG tablet TAKE 1 TABLET BY MOUTH AT BEDTIME FOR SLEEP 06/25/20   [provider]      Allergies    Codeine and Pegasys [peginterferon alfa-2a]    Review of Systems  Review of Systems  All other systems reviewed and are negative.   Physical Exam Updated Vital Signs BP (!) 147/85 (BP Location: Left Arm)   Pulse 98   Temp 98 F (36.7 C) (Oral)   Resp 18   Ht 1.651 m (5\' 5" )   Wt 49 kg   SpO2 92%   BMI 17.98 kg/m  Physical Exam Vitals and nursing note reviewed.  Constitutional:      General: She is not in acute distress.    Appearance: She is well-developed.  HENT:     Head: Normocephalic and atraumatic.     Mouth/Throat:     Pharynx: No oropharyngeal exudate.  Eyes:     General: No scleral icterus.       Right eye: No discharge.        Left eye: No discharge.     Conjunctiva/sclera: Conjunctivae normal.     Pupils:  Pupils are equal, round, and reactive to light.  Neck:     Thyroid: No thyromegaly.     Vascular: No JVD.  Cardiovascular:     Rate and Rhythm: Normal rate and regular rhythm.     Heart sounds: Normal heart sounds. No murmur heard.    No friction rub. No gallop.  Pulmonary:     Effort: Pulmonary effort is normal. No respiratory distress.     Breath sounds: Normal breath sounds. No wheezing or rales.  Abdominal:     General: Bowel sounds are normal. There is no distension.     Palpations: Abdomen is soft. There is no mass.     Tenderness: There is no abdominal tenderness.  Musculoskeletal:        General: Tenderness and signs of injury present.     Cervical back: Normal range of motion and neck supple.     Right lower leg: No edema.     Left lower leg: No edema.     Comments: Decreased range of motion of the right hip secondary to pain, she cannot flex the hip and she has tenderness over the greater trochanter as well as tenderness over the pelvis when stressed with anterior posterior stress.  No leg length discrepancies, normal joints of the ankles and the knees bilaterally, left lower extremity is normal, right upper extremity is normal, left upper extremity is at baseline considering patient's deficits  Lymphadenopathy:     Cervical: No cervical adenopathy.  Skin:    General: Skin is warm and dry.     Findings: No erythema or rash.  Neurological:     Mental Status: She is alert.     Coordination: Coordination normal.  Psychiatric:        Behavior: Behavior normal.     ED Results / Procedures / Treatments   Labs (all labs ordered are listed, but only abnormal results are displayed) Labs Reviewed  BASIC METABOLIC PANEL - Abnormal; Notable for the following components:      Result Value   Potassium 3.3 (*)    Chloride 95 (*)    Glucose, Bld 106 (*)    All other components within normal limits  CBC WITH DIFFERENTIAL/PLATELET - Abnormal; Notable for the following components:    WBC 21.6 (*)    Neutro Abs 18.6 (*)    Monocytes Absolute 1.5 (*)    Abs Immature Granulocytes 0.12 (*)    All other components within normal limits  PROTIME-INR  TYPE AND SCREEN    EKG None  Radiology DG Chest [facility] 1 [address]  Result Date: 03/27/2023 CLINICAL DATA:  Fall today.  Low back and left groin pain. EXAM: PORTABLE CHEST 1 VIEW COMPARISON:  Chest radiographs 03/20/2013. FINDINGS: 1405 hours. The heart size and mediastinal contours are stable without evidence of mediastinal hematoma. There is mild aortic atherosclerosis. The lungs are clear. There is no pleural effusion or pneumothorax. No acute fractures are identified. There are densely calcified breast implants bilaterally. IMPRESSION: No evidence of acute chest injury or active cardiopulmonary process. Aortic atherosclerosis. Electronically Signed   By: Carey Bullocks M.D.   On: 03/27/2023 14:23   DG Hip Unilat With Pelvis 2-3 Views Right  Result Date: 03/27/2023 CLINICAL DATA:  Fall today.  Reported low back and left groin pain. EXAM: DG HIP (WITH OR WITHOUT PELVIS) 2-3V RIGHT COMPARISON:  Pelvic CT 09/23/2021. FINDINGS: The bones are demineralized. There are acute appearing right parasymphyseal and superior pubic rami fractures. No other acute fractures are identified. Advanced asymmetric right hip osteoarthritis again noted with bone-on-bone apposition. There is no evidence of proximal femur fracture, dislocation or osteonecrosis. There are coarse calcifications in the soft tissues of the proximal right thigh which are likely related to remote trauma. Aortoiliac atherosclerosis noted. IMPRESSION: Acute appearing right parasymphyseal and superior pubic rami fractures. No evidence of proximal femur fracture or dislocation. Electronically Signed   By: Carey Bullocks M.D.   On: 03/27/2023 14:22    Procedures Procedures    Medications Ordered in ED Medications  0.9 %  sodium chloride infusion ( Intravenous New Bag/Given 03/27/23  1344)  fentaNYL (SUBLIMAZE) injection 50 mcg (50 mcg Intravenous Given 03/27/23 1345)    ED Course/ Medical Decision Making/ A&P                             Medical Decision Making Amount and/or Complexity of Data Reviewed Labs: ordered. Radiology: ordered.  Risk Prescription drug management. Decision regarding hospitalization.    This patient presents to the ED for concern of what appears to be a possible fracture of the pelvis or the hip, she has had a fall with tenderness in that area, this involves an extensive number of treatment options, and is a complaint that carries with it a high risk of complications and morbidity.  The differential diagnosis includes fracture, dislocation, contusion, strain   Co morbidities that complicate the patient evaluation  Chronic debility from prior brain mass   Additional history obtained:  Additional history obtained from medical record and paramedics External records from outside source obtained and reviewed including normal vital signs prehospital other than mild tachycardia.  Follows with local family doctors, no recent admissions to the hospital based on review of the medical record.  Multiple family doctor visits   Lab Tests:  I Ordered, and personally interpreted labs.  The pertinent results include: Leukocytosis, no other acute findings on CBC.  Metabolic panel shows minimal hypochloremia and hypokalemia no renal dysfunction.  INR of 1.0   Imaging Studies ordered:  I ordered imaging studies including x-ray of the pelvis and the chest. I independently visualized and interpreted imaging which showed acute fractures of the right parasymphyseal and superior pubic rami, no femur fracture, chest x-ray unremarkable I agree with the radiologist interpretation   Cardiac Monitoring: / EKG:  The patient was maintained on a cardiac monitor.  I personally viewed and interpreted the cardiac monitored which showed an underlying rhythm of:  Normal sinus rhythm   Consultations Obtained:  I requested consultation with the  hospitalist Dr. Flossie Dibble,  and discussed lab and imaging findings as well as pertinent plan - they recommend: Admit the patient to the hospital Will consult with orthopedics for formal recommendations from Ortho -discussed with Dr. Linna Caprice who recommends the patient can be weightbearing with a walker, physical therapy in the hospital, pain control and can follow-up with him in 4 weeks, after CT scan has resulted he will follow it and leave other recommendations as needed in a note.  The patient does not need to be transferred and at this time is nonsurgical.   Problem List / ED Course / Critical interventions / Medication management  Patient has fractures of the pelvis and will likely need physical therapy pain control and possible rehab I ordered medication including pain medication for fracture Reevaluation of the patient after these medicines showed that the patient improved I have reviewed the patients home medicines and have made adjustments as needed   Social Determinants of Health:  Difficulty walking secondary to brain tumor   Test / Admission - Considered:  Will admit to the hospital         Final Clinical Impression(s) / ED Diagnoses Final diagnoses:  Closed fracture of superior ramus of right pubis, initial encounter (HCC)  Leukocytosis, unspecified type     Eber Hong, MD 03/27/23 1512

## 2023-03-27 NOTE — Assessment & Plan Note (Signed)
History of brain tumor on removal over 40 years ago And residual of left upper extremity and left lower extremity weakness with mild contraction Post tumor removal - seizures (last episode over 20 years ago) Controlled on Keppra will continue current dose

## 2023-03-27 NOTE — ED Notes (Signed)
Patient reports that she takes her Keppra at 69, notified attending new order given to change time.

## 2023-03-27 NOTE — Hospital Course (Addendum)
Catherine Gonzalez is a 72 year old female with a past medical history of HTN, hypothyroidism, remote childhood history of brain tumor-with residual of left sided weakness with gait abnormality... Presented status post accidental fall and pain to pelvic area post fall. Patient reports that her fall was accidental as she lost her balance in the kitchen, she did not hit her head, but post fall has been experiencing right groin pain.  She was able to pull herself up, but could not ambulate due to pain in the right groin and buttocks area. Denies of having any recent illnesses.  ED evaluation/course: Blood pressure (!) 147/85, pulse 98, temperature 98 F (36.7 C), temperature source Oral, resp. rate 18, height 5\' 5"  (1.651 m), weight 49 kg, SpO2 92 %.  CBC WBC 21.6, with neutrophils of 18.6 BMP within normal limits  Chest x-ray-no acute finding  X-ray of the right hip  -acute right parasymphyseal superior pubic rami fracture  Due to significant pain patient was not able to ambulate or be safely discharged Requested for patient to be admitted for PT evaluation, pain management

## 2023-03-27 NOTE — Assessment & Plan Note (Signed)
-   Will check serum TSH, continue home dose Synthroid

## 2023-03-27 NOTE — H&P (Signed)
History and Physical   Patient: Catherine Gonzalez                            PCP: Benita Stabile, MD                    DOB: 1951-03-24            DOA: 03/27/2023 ZOX:[id number]             DOS: 03/27/2023, 4:03 PM  Benita Stabile, MD  Patient coming from:   HOME  I have personally reviewed patient's medical records, in electronic medical records, including:  [place] [facility], and care everywhere.    Chief Complaint:   Chief Complaint  Patient presents with   Fall    History of present illness:    Catherine Gonzalez is a 72 year old female with a past medical history of HTN, hypothyroidism, remote childhood history of brain tumor-with residual of left sided weakness with gait abnormality... Presented status post accidental fall and pain to pelvic area post fall. Patient reports that her fall was accidental as she lost her balance in the kitchen, she did not hit her head, but post fall has been experiencing right groin pain.  She was able to pull herself up, but could not ambulate due to pain in the right groin and buttocks area. Denies of having any recent illnesses.  ED evaluation/course: Blood pressure (!) 147/85, pulse 98, temperature 98 F (36.7 C), temperature source Oral, resp. rate 18, height 5\' 5"  (1.651 m), weight 49 kg, SpO2 92 %.  CBC WBC 21.6, with neutrophils of 18.6 BMP within normal limits  Chest x-ray-no acute finding  X-ray of the right hip  -acute right parasymphyseal superior pubic rami fracture  Due to significant pain patient was not able to ambulate or be safely discharged Requested for patient to be admitted for PT evaluation, pain management   Patient Denies having: Fever, Chills, Cough, SOB, Chest Pain, Abd pain, N/V/D, headache, dizziness, lightheadedness,  Dysuria, no other Joint pain, rash, open wounds   Review of Systems: As per HPI, otherwise 10 point review of systems were negative.    ----------------------------------------------------------------------------------------------------------------------  Allergies  Allergen Reactions   Codeine Nausea And Vomiting   Pegasys [Peginterferon Alfa-2a] Hives    Home MEDs:  Prior to Admission medications   Medication Sig Start Date End Date Taking? Authorizing Provider  amLODipine (NORVASC) 5 MG tablet Take 5 mg by mouth daily.    [provider]  busPIRone (BUSPAR) 5 MG tablet Take 5 mg by mouth 2 (two) times daily. 01/26/22   [provider]  estradiol (ESTRACE) 0.1 MG/GM vaginal cream APPLY 1 GRAM VAGINALLY 5 TIMES PER WEEK 02/17/23   McKenzie, Mardene Celeste, MD  fluticasone (FLONASE) 50 MCG/ACT nasal spray Place 2 sprays into both nostrils daily. 05/18/20   Henderly, Britni A, PA-C  hydrOXYzine (ATARAX/VISTARIL) 25 MG tablet Take 25 mg by mouth 2 (two) times daily as needed. Patient not taking: Reported on 02/17/2023 06/05/20   [provider]  levETIRAcetam (KEPPRA) 750 MG tablet TAKE 1 TABLET TWICE A DAY Patient taking differently: Take 750 mg by mouth 2 (two) times daily. 04/11/14   Donita Brooks, MD  levothyroxine (SYNTHROID) 75 MCG tablet Take 75 mcg by mouth daily before breakfast. TAKE 1 1/2 TABS DAILY    [provider]  meclizine (ANTIVERT) 25 MG tablet Take 1 tablet (25 mg total)  by mouth 3 (three) times daily as needed for dizziness. Patient not taking: Reported on 02/17/2023 07/29/21   Pricilla Loveless, MD  olmesartan (BENICAR) 20 MG tablet Take 20 mg by mouth daily.    [provider]  ondansetron (ZOFRAN ODT) 4 MG disintegrating tablet Take 1 tablet (4 mg total) by mouth every 8 (eight) hours as needed for nausea or vomiting. Patient not taking: Reported on 02/17/2023 05/18/20   Henderly, Britni A, PA-C  sodium bicarbonate 325 MG tablet Take 650 mg by mouth 4 (four) times daily -  with meals and at bedtime.     [provider]  traMADol (ULTRAM) 50 MG tablet Take 1  tablet (50 mg total) by mouth 2 (two) times daily. Patient not taking: Reported on 02/17/2023 07/17/20   Durward Parcel, FNP  traMADol (ULTRAM) 50 MG tablet Take 1 tablet (50 mg total) by mouth every 6 (six) hours as needed. Patient not taking: Reported on 02/17/2023 07/25/20   Cindee Salt, MD  traZODone (DESYREL) 50 MG tablet TAKE 1 TABLET BY MOUTH AT BEDTIME FOR SLEEP 06/25/20   [provider]    PRN MEDs: sodium chloride, acetaminophen **OR** acetaminophen, bisacodyl, fentaNYL (SUBLIMAZE) injection, hydrALAZINE, HYDROmorphone (DILAUDID) injection, hydrOXYzine, ipratropium, levalbuterol, ondansetron **OR** ondansetron (ZOFRAN) IV, oxyCODONE, senna-docusate, sodium chloride flush, sodium phosphate, traZODone  Past Medical History:  Diagnosis Date   Allergic rhinitis    Anemia    Anxiety    Blood transfusion without reported diagnosis    Depression    Ectopic pregnancy    Finger fracture, left    index   GERD (gastroesophageal reflux disease)    Hepatitis C    Hypertension    Hypothyroidism    Seizures (HCC)     Past Surgical History:  Procedure Laterality Date   APPENDECTOMY     BRAIN SURGERY     x 3   BREAST ENHANCEMENT SURGERY     CHOLECYSTECTOMY     CLOSED REDUCTION FINGER WITH PERCUTANEOUS PINNING Left 07/25/2020   Procedure: CLOSED REDUCTION FINGER WITH PERCUTANEOUS PINNING;  Surgeon: Cindee Salt, MD;  Location: [facility];  Service: Orthopedics;  Laterality: Left;  AXILLARY BLOCK   ECTOPIC PREGNANCY SURGERY     LUMBAR SPINE SURGERY     TONSILLECTOMY AND ADENOIDECTOMY       reports that she has been smoking cigarettes. She has a 10.00 pack-year smoking history. She has never used smokeless tobacco. She reports current alcohol use. She reports that she does not use drugs.   Family History  Problem Relation Age of Onset   Heart failure Mother    Lung cancer Father    Alcohol abuse Brother    Leukemia Brother    Colon cancer Neg Hx      Physical Exam:   Vitals:   03/27/23 1247 03/27/23 1252 03/27/23 1301  BP: (!) 142/75  (!) 147/85  Pulse: (!) 103  98  Resp: 20  18  Temp: 98.5 F (36.9 C)  98 F (36.7 C)  TempSrc: Oral  Oral  SpO2: 94%  92%  Weight:  49 kg   Height:  5\' 5"  (1.651 m)    Constitutional: NAD, calm, comfortable Eyes: PERRL, lids and conjunctivae normal ENMT: Mucous membranes are moist. Posterior pharynx clear of any exudate or lesions.Normal dentition.  Neck: normal, supple, no masses, no thyromegaly Respiratory: clear to auscultation bilaterally, no wheezing, no crackles. Normal respiratory effort. No accessory muscle use.  Cardiovascular: Regular rate and rhythm, no murmurs /  rubs / gallops. No extremity edema. 2+ pedal pulses. No carotid bruits.  Abdomen: no tenderness, no masses palpated. No hepatosplenomegaly. Bowel sounds positive.  Musculoskeletal: Lower back tenderness, decreased range of motion right hip secondary to pain, limited flexion, tenderness over the greater trochanteric area tenderness over pelvic, anterior posterior with stress, no leg length discrepancy normal knee and ankle  no clubbing / cyanosis. No joint deformity upper extremities... With exception of chronic weakness on the left,  no contractures. Normal muscle tone.  Neurologic: CN II-XII grossly intact. Sensation intact, DTR normal. Strength 5/5 in all 4.  Psychiatric: Normal judgment and insight. Alert and oriented x 3. Normal mood.  Skin: no rashes, lesions, ulcers. No induration Decubitus/ulcers: Wounds: per nursing documentation    Labs on admission:    I have personally reviewed following labs and imaging studies  CBC: Recent Labs  Lab 03/27/23 1302  WBC 21.6*  NEUTROABS 18.6*  HGB 13.7  HCT 39.8  MCV 90.7  PLT 285   Basic Metabolic Panel: Recent Labs  Lab 03/27/23 1302  NA 135  K 3.3*  CL 95*  CO2 28  GLUCOSE 106*  BUN 19  CREATININE 0.78  CALCIUM 9.3    Recent Labs  Lab 03/27/23 1302   INR 1.0   Last A1C:  No results found for: "HGBA1C"   Radiologic Exams on Admission:   CT PELVIS WO CONTRAST  Result Date: 03/27/2023 CLINICAL DATA:  Pelvic fracture, fall. EXAM: CT PELVIS WITHOUT CONTRAST TECHNIQUE: Multidetector CT imaging of the pelvis was performed following the standard protocol without intravenous contrast. RADIATION DOSE REDUCTION: This exam was performed according to the departmental dose-optimization program which includes automated exposure control, adjustment of the mA and/or kV according to patient size and/or use of iterative reconstruction technique. COMPARISON:  Right hip x-ray 03/27/2023. CT abdomen and pelvis 09/23/2021. FINDINGS: Urinary Tract:  No abnormality visualized. Bowel:  No acute findings.  Sigmoid colon diverticulosis present. Vascular/Lymphatic: Atherosclerotic calcifications are present. No pathologically enlarged lymph nodes. Reproductive:  No mass or other significant abnormality Other: There is a small amount of extraperitoneal pelvic hemorrhage on the right. Musculoskeletal: Bones are osteopenic. There is an acute comminuted fracture of the right superior pubic ramus medially extending to the pubic symphysis. There is an acute nondisplaced fracture through the right inferior pubic ramus. There is no dislocation. There is intramuscular edema and hemorrhage surrounding the fracture sites. There are mild degenerative changes of the left hip and moderate severe degenerative changes of the right hip. Degenerative changes also affect the visualized lower lumbar spine. IMPRESSION: 1. Acute comminuted fracture of the right superior pubic ramus extending to the pubic symphysis. 2. Acute nondisplaced fracture of the right inferior pubic ramus. 3. Small amount of extraperitoneal pelvic hemorrhage on the right. Electronically Signed   By: Darliss Cheney M.D.   On: 03/27/2023 15:40   DG Chest Port 1 View  Result Date: 03/27/2023 CLINICAL DATA:  Fall today.  Low  back and left groin pain. EXAM: PORTABLE CHEST 1 VIEW COMPARISON:  Chest radiographs 03/20/2013. FINDINGS: 1405 hours. The heart size and mediastinal contours are stable without evidence of mediastinal hematoma. There is mild aortic atherosclerosis. The lungs are clear. There is no pleural effusion or pneumothorax. No acute fractures are identified. There are densely calcified breast implants bilaterally. IMPRESSION: No evidence of acute chest injury or active cardiopulmonary process. Aortic atherosclerosis. Electronically Signed   By: Carey Bullocks M.D.   On: 03/27/2023 14:23   DG Hip Unilat With  Pelvis 2-3 Views Right  Result Date: 03/27/2023 CLINICAL DATA:  Fall today.  Reported low back and left groin pain. EXAM: DG HIP (WITH OR WITHOUT PELVIS) 2-3V RIGHT COMPARISON:  Pelvic CT 09/23/2021. FINDINGS: The bones are demineralized. There are acute appearing right parasymphyseal and superior pubic rami fractures. No other acute fractures are identified. Advanced asymmetric right hip osteoarthritis again noted with bone-on-bone apposition. There is no evidence of proximal femur fracture, dislocation or osteonecrosis. There are coarse calcifications in the soft tissues of the proximal right thigh which are likely related to remote trauma. Aortoiliac atherosclerosis noted. IMPRESSION: Acute appearing right parasymphyseal and superior pubic rami fractures. No evidence of proximal femur fracture or dislocation. Electronically Signed   By: Carey Bullocks M.D.   On: 03/27/2023 14:22    EKG:   Independently reviewed.  Orders placed or performed during the hospital encounter of 03/27/23   ED EKG   ED EKG   EKG 12-Lead   -------------------------------------------------------------------------------------------------------------------------  Assessment / Plan:   Principal Problem:   Pelvic fracture (HCC) Active Problems:   Hypertension   Leukocytosis   Seizures (HCC)   Hypokalemia   Depression    Hypothyroidism   Assessment and Plan: * Pelvic fracture (HCC) - Admitting for close observation -X-ray of the right hip  -acute right parasymphyseal superior pubic rami fracture -Will consult PT OT -Pain management with p.o. and as needed IV analgesics -Fall precautions -Will check vitamin D level   Seizures (HCC) History of brain tumor on removal over 40 years ago And residual of left upper extremity and left lower extremity weakness with mild contraction Post tumor removal - seizures (last episode over 20 years ago) Controlled on Keppra will continue current dose  Leukocytosis -Likely reactive due to pelvic fracture, fall -Afebrile normotensive, chest x-ray clear, will check UA -No signs of infection we will holding antibiotic use  Hypertension -Blood pressure currently stable -Resuming home medication amlodipine, holding Benicar  Hypothyroidism - Will check serum TSH, continue home dose Synthroid  Depression -Continuing home medication of BuSpar, Atarax  Hypokalemia - Potassium level at 3.3, will replete orally -Check magnesium level and replete accordingly     Consults called:  Ortho team at [facility] ( called by EDP Dr. Hyacinth Meeker) ------------------------------------------------------------------------------------------------------------------------- DVT prophylaxis:  enoxaparin (LOVENOX) injection 40 mg Start: 03/27/23 2200 TED hose Start: 03/27/23 1446 SCDs Start: 03/27/23 1446   Code Status:   Code Status: Full Code   Admission status: Patient will be admitted as Observation, with a greater than 2 midnight length of stay. Level of care: Med-Surg   Family Communication:  none at bedside  (The above findings and plan of care has been discussed with patient in detail, the patient expressed understanding and agreement of above plan)   --------------------------------------------------------------------------------------------------------------------------------------------------  Disposition Plan:  Anticipated 1-2 days Status is: Observation The patient remains OBS appropriate and will d/c before 2 midnights.  ----------------------------------------------------------------------------------------------------------------------------------------------------  Time spent:  20  Min.  Was spent seeing and evaluating the patient, reviewing all medical records, drawn plan of care.  SIGNED: Kendell Bane, MD, FHM. FAAFP. [place] [facility], Pager  (Please use [email or web address] to page/ or secure chat through epic) If 7PM-7AM, please contact night-coverage [email or web address],  03/27/2023, 4:03 PM

## 2023-03-27 NOTE — Assessment & Plan Note (Signed)
-  Likely reactive due to pelvic fracture, fall -Afebrile normotensive, chest x-ray clear, will check UA -No signs of infection we will holding antibiotic use

## 2023-03-27 NOTE — Assessment & Plan Note (Addendum)
-  Tensive blood pressure this morning 90/62,  holding home medication of amlodipine and Benicar

## 2023-03-27 NOTE — Assessment & Plan Note (Addendum)
-  X-ray of the right hip  -acute right parasymphyseal superior pubic rami fracture -Still complaining of pain and discomfort addressed -Will consult PT OT -Optimizing pain management with p.o. and as needed IV analgesics -Fall precautions -Anticipating discharge to SNF

## 2023-03-27 NOTE — ED Triage Notes (Signed)
Pt arrives via EMS with reports of a fall today while going into the kitchen. Pt endorses lower back pain and left groin pain.

## 2023-03-27 NOTE — Assessment & Plan Note (Signed)
-  Continuing home medication of BuSpar, Atarax

## 2023-03-27 NOTE — Assessment & Plan Note (Signed)
-   Potassium level at 3.3,>> 4.2, 4.2  -Potassium was repleted with magnesium

## 2023-03-28 DIAGNOSIS — D62 Acute posthemorrhagic anemia: Secondary | ICD-10-CM | POA: Diagnosis present

## 2023-03-28 DIAGNOSIS — K219 Gastro-esophageal reflux disease without esophagitis: Secondary | ICD-10-CM | POA: Diagnosis present

## 2023-03-28 DIAGNOSIS — F419 Anxiety disorder, unspecified: Secondary | ICD-10-CM | POA: Diagnosis present

## 2023-03-28 DIAGNOSIS — F32A Depression, unspecified: Secondary | ICD-10-CM | POA: Diagnosis present

## 2023-03-28 DIAGNOSIS — F17210 Nicotine dependence, cigarettes, uncomplicated: Secondary | ICD-10-CM | POA: Diagnosis present

## 2023-03-28 DIAGNOSIS — S3282XA Multiple fractures of pelvis without disruption of pelvic ring, initial encounter for closed fracture: Secondary | ICD-10-CM | POA: Diagnosis not present

## 2023-03-28 DIAGNOSIS — S32591A Other specified fracture of right pubis, initial encounter for closed fracture: Secondary | ICD-10-CM | POA: Diagnosis present

## 2023-03-28 DIAGNOSIS — E441 Mild protein-calorie malnutrition: Secondary | ICD-10-CM | POA: Diagnosis present

## 2023-03-28 DIAGNOSIS — W010XXA Fall on same level from slipping, tripping and stumbling without subsequent striking against object, initial encounter: Secondary | ICD-10-CM | POA: Diagnosis present

## 2023-03-28 DIAGNOSIS — Z86011 Personal history of benign neoplasm of the brain: Secondary | ICD-10-CM | POA: Diagnosis not present

## 2023-03-28 DIAGNOSIS — G8194 Hemiplegia, unspecified affecting left nondominant side: Secondary | ICD-10-CM | POA: Diagnosis present

## 2023-03-28 DIAGNOSIS — M6281 Muscle weakness (generalized): Secondary | ICD-10-CM | POA: Diagnosis present

## 2023-03-28 DIAGNOSIS — J449 Chronic obstructive pulmonary disease, unspecified: Secondary | ICD-10-CM | POA: Diagnosis present

## 2023-03-28 DIAGNOSIS — R262 Difficulty in walking, not elsewhere classified: Secondary | ICD-10-CM | POA: Diagnosis present

## 2023-03-28 DIAGNOSIS — I959 Hypotension, unspecified: Secondary | ICD-10-CM | POA: Diagnosis present

## 2023-03-28 DIAGNOSIS — J309 Allergic rhinitis, unspecified: Secondary | ICD-10-CM | POA: Diagnosis present

## 2023-03-28 DIAGNOSIS — J9601 Acute respiratory failure with hypoxia: Secondary | ICD-10-CM | POA: Diagnosis present

## 2023-03-28 DIAGNOSIS — J438 Other emphysema: Secondary | ICD-10-CM | POA: Diagnosis not present

## 2023-03-28 DIAGNOSIS — S3210XA Unspecified fracture of sacrum, initial encounter for closed fracture: Secondary | ICD-10-CM | POA: Diagnosis not present

## 2023-03-28 DIAGNOSIS — D638 Anemia in other chronic diseases classified elsewhere: Secondary | ICD-10-CM | POA: Diagnosis present

## 2023-03-28 DIAGNOSIS — S32409A Unspecified fracture of unspecified acetabulum, initial encounter for closed fracture: Secondary | ICD-10-CM | POA: Diagnosis not present

## 2023-03-28 DIAGNOSIS — Z8249 Family history of ischemic heart disease and other diseases of the circulatory system: Secondary | ICD-10-CM | POA: Diagnosis not present

## 2023-03-28 DIAGNOSIS — E876 Hypokalemia: Secondary | ICD-10-CM | POA: Diagnosis present

## 2023-03-28 DIAGNOSIS — S329XXD Fracture of unspecified parts of lumbosacral spine and pelvis, subsequent encounter for fracture with routine healing: Secondary | ICD-10-CM | POA: Diagnosis not present

## 2023-03-28 DIAGNOSIS — S329XXA Fracture of unspecified parts of lumbosacral spine and pelvis, initial encounter for closed fracture: Secondary | ICD-10-CM | POA: Diagnosis not present

## 2023-03-28 DIAGNOSIS — F339 Major depressive disorder, recurrent, unspecified: Secondary | ICD-10-CM | POA: Diagnosis present

## 2023-03-28 DIAGNOSIS — I10 Essential (primary) hypertension: Secondary | ICD-10-CM | POA: Diagnosis present

## 2023-03-28 DIAGNOSIS — D72829 Elevated white blood cell count, unspecified: Secondary | ICD-10-CM | POA: Diagnosis present

## 2023-03-28 DIAGNOSIS — S32592A Other specified fracture of left pubis, initial encounter for closed fracture: Secondary | ICD-10-CM | POA: Diagnosis not present

## 2023-03-28 DIAGNOSIS — E039 Hypothyroidism, unspecified: Secondary | ICD-10-CM | POA: Diagnosis present

## 2023-03-28 DIAGNOSIS — Z85841 Personal history of malignant neoplasm of brain: Secondary | ICD-10-CM | POA: Diagnosis not present

## 2023-03-28 DIAGNOSIS — R569 Unspecified convulsions: Secondary | ICD-10-CM | POA: Diagnosis present

## 2023-03-28 DIAGNOSIS — F411 Generalized anxiety disorder: Secondary | ICD-10-CM | POA: Diagnosis present

## 2023-03-28 DIAGNOSIS — Y92000 Kitchen of unspecified non-institutional (private) residence as  the place of occurrence of the external cause: Secondary | ICD-10-CM | POA: Diagnosis not present

## 2023-03-28 DIAGNOSIS — Z79890 Hormone replacement therapy (postmenopausal): Secondary | ICD-10-CM | POA: Diagnosis not present

## 2023-03-28 DIAGNOSIS — R1319 Other dysphagia: Secondary | ICD-10-CM | POA: Diagnosis present

## 2023-03-28 DIAGNOSIS — S300XXA Contusion of lower back and pelvis, initial encounter: Secondary | ICD-10-CM | POA: Diagnosis present

## 2023-03-28 DIAGNOSIS — E878 Other disorders of electrolyte and fluid balance, not elsewhere classified: Secondary | ICD-10-CM | POA: Diagnosis present

## 2023-03-28 LAB — BASIC METABOLIC PANEL
Anion gap: 9 (ref 5–15)
BUN: 16 mg/dL (ref 8–23)
CO2: 26 mmol/L (ref 22–32)
Calcium: 8.8 mg/dL — ABNORMAL LOW (ref 8.9–10.3)
Chloride: 101 mmol/L (ref 98–111)
Creatinine, Ser: 0.99 mg/dL (ref 0.44–1.00)
GFR, Estimated: >60 mL/min (ref >60)
Glucose, Bld: 168 mg/dL — ABNORMAL HIGH (ref 70–99)
Potassium: 3.9 mmol/L (ref 3.5–5.1)
Sodium: 136 mmol/L (ref 135–145)

## 2023-03-28 LAB — PROTIME-INR
INR: 1.2 (ref 0.8–1.2)
Prothrombin Time: 14.6 seconds (ref 11.4–15.2)

## 2023-03-28 LAB — VITAMIN D 25 HYDROXY (VIT D DEFICIENCY, FRACTURES): Vit D, 25-Hydroxy: 37.36 ng/mL (ref 30–100)

## 2023-03-28 LAB — CBC
HCT: 32.6 % — ABNORMAL LOW (ref 36.0–46.0)
Hemoglobin: 10.7 g/dL — ABNORMAL LOW (ref 12.0–15.0)
MCH: 30.7 pg (ref 26.0–34.0)
MCHC: 32.8 g/dL (ref 30.0–36.0)
MCV: 93.4 fL (ref 80.0–100.0)
Platelets: 238 10*3/uL (ref 150–400)
RBC: 3.49 MIL/uL — ABNORMAL LOW (ref 3.87–5.11)
RDW: 12.9 % (ref 11.5–15.5)
WBC: 18.2 10*3/uL — ABNORMAL HIGH (ref 4.0–10.5)
nRBC: 0 % (ref 0.0–0.2)

## 2023-03-28 LAB — GLUCOSE, CAPILLARY
Glucose-Capillary: 172 mg/dL — ABNORMAL HIGH (ref 70–99)
Glucose-Capillary: 113 mg/dL — ABNORMAL HIGH (ref 70–99)

## 2023-03-28 LAB — APTT: aPTT: 32 seconds (ref 24–36)

## 2023-03-28 LAB — CK TOTAL AND CKMB (NOT AT ARMC): CK, MB: 2.2 ng/mL (ref 0.5–5.0)

## 2023-03-28 LAB — HIV ANTIBODY (ROUTINE TESTING W REFLEX): HIV Screen 4th Generation wRfx: NONREACTIVE

## 2023-03-28 MED ORDER — SODIUM BICARBONATE 650 MG PO TABS
325.0000 mg | ORAL_TABLET | Freq: Three times a day (TID) | ORAL | Status: DC
  Administered 2023-03-28 – 2023-03-31 (×9): 325 mg via ORAL
  Filled 2023-03-28 (×12): qty 1

## 2023-03-28 MED ORDER — AMLODIPINE BESYLATE 5 MG PO TABS: 5.0000 mg | ORAL_TABLET | Freq: Every day | ORAL | Status: DC

## 2023-03-28 MED ORDER — PANTOPRAZOLE SODIUM 40 MG PO TBEC
40.0000 mg | DELAYED_RELEASE_TABLET | Freq: Every day | ORAL | Status: DC
  Administered 2023-03-28 – 2023-03-31 (×4): 40 mg via ORAL
  Filled 2023-03-28 (×4): qty 1

## 2023-03-28 NOTE — Progress Notes (Signed)
PROGRESS NOTE    Patient: Catherine Gonzalez                            PCP: Benita Stabile, MD                    DOB: 04-02-1951            DOA: 03/27/2023 ZOX:[id number]             DOS: 03/28/2023, 10:40 AM   LOS: 0 days   Date of Service: The patient was seen and examined on 03/28/2023  Subjective:   The patient was seen and examined this morning. Mildly hypotensive, and pain otherwise hemodynamically stable Currently 90/62, afebrile   Brief Narrative:   SKYLYNNE SCHLECHTER is a 72 year old female with a past medical history of HTN, hypothyroidism, remote childhood history of brain tumor-with residual of left sided weakness with gait abnormality... Presented status post accidental fall and pain to pelvic area post fall. Patient reports that her fall was accidental as she lost her balance in the kitchen, she did not hit her head, but post fall has been experiencing right groin pain.  She was able to pull herself up, but could not ambulate due to pain in the right groin and buttocks area. Denies of having any recent illnesses.  ED evaluation/course: Blood pressure (!) 147/85, pulse 98, temperature 98 F (36.7 C), temperature source Oral, resp. rate 18, height 5\' 5"  (1.651 m), weight 49 kg, SpO2 92 %.  CBC WBC 21.6, with neutrophils of 18.6 BMP within normal limits  Chest x-ray-no acute finding  X-ray of the right hip  -acute right parasymphyseal superior pubic rami fracture  Due to significant pain patient was not able to ambulate or be safely discharged Requested for patient to be admitted for PT evaluation, pain management    Assessment & Plan:   Principal Problem:   Pelvic fracture (HCC) Active Problems:   Hypertension   Leukocytosis   Seizures (HCC)   Hypokalemia   Depression   Hypothyroidism     Assessment and Plan: * Pelvic fracture (HCC) - Admitting for close observation -X-ray of the right hip  -acute right parasymphyseal superior pubic rami  fracture -Will consult PT OT -Pain management with p.o. and as needed IV analgesics -Fall precautions -Will check vitamin D level   Seizures (HCC) History of brain tumor on removal over 40 years ago And residual of left upper extremity and left lower extremity weakness with mild contraction Post tumor removal - seizures (last episode over 20 years ago) Controlled on Keppra will continue current dose  Leukocytosis -Likely reactive due to pelvic fracture, fall -Afebrile normotensive, chest x-ray clear, UA within normal limits -No signs of infection we will holding antibiotic use  Hypertension -Tensive blood pressure this morning 90/62,  holding home medication of amlodipine and Benicar   Hypothyroidism - TSH normal at 1.076 - continue home dose Synthroid  Depression -Continuing home medication of BuSpar, Atarax  Hypokalemia - Potassium level at 3.3, will replete orally -Check magnesium level and replete accordingly            ----------------------------------------------------------------------------------------------------------------------------------------------- Nutritional status:  The patient's BMI is: Body mass index is 19.92 kg/m. I agree with the assessment and plan as outlined ----------------------------------------------------------------------------------------------------------------------  DVT prophylaxis:  enoxaparin (LOVENOX) injection 40 mg Start: 03/27/23 2200 TED hose Start: 03/27/23 1446 SCDs Start: 03/27/23 1446   Code Status:   Code  Status: Full Code  Family Communication: No family member present at bedside- attempt will be made to update daily The above findings and plan of care has been discussed with patient (and family)  in detail,  they expressed understanding and agreement of above. -Advance care planning has been discussed.   Admission status:   Status is: Inpatient Remains inpatient appropriate because: Needing significant  pain management for, with IV analgesics, PT OT evaluation, unable to ambulate   Disposition: From  - home             Planning for discharge in 1-2 days: to SNF  Procedures:   No admission procedures for hospital encounter.   Antimicrobials:  Anti-infectives (From admission, onward)    None        Medication:   [START ON 03/29/2023] amLODipine  5 mg Oral Daily   busPIRone  5 mg Oral BID   enoxaparin (LOVENOX) injection  40 mg Subcutaneous Q24H   fluticasone  1 spray Each Nare Daily   fluticasone  2 spray Each Nare Daily   levETIRAcetam  750 mg Oral BID   levothyroxine  75 mcg Oral QAC breakfast   sodium chloride flush  3 mL Intravenous Q12H   traMADol  50 mg Oral BID    acetaminophen **OR** acetaminophen, bisacodyl, hydrALAZINE, HYDROmorphone (DILAUDID) injection, hydrOXYzine, ipratropium, levalbuterol, ondansetron **OR** ondansetron (ZOFRAN) IV, oxyCODONE, senna-docusate, sodium phosphate, traZODone   Objective:   Vitals:   03/27/23 2055 03/28/23 0454 03/28/23 0516 03/28/23 0524  BP: (!) 111/95  90/62   Pulse: (!) 108  (!) 102 99  Resp: 20  20   Temp: 98.4 F (36.9 C)     TempSrc:      SpO2:   95%   Weight:  54.3 kg    Height:        Intake/Output Summary (Last 24 hours) at 03/28/2023 1040 Last data filed at 03/28/2023 1036 Gross per 24 hour  Intake 600 ml  Output 800 ml  Net -200 ml   Filed Weights   03/27/23 1252 03/28/23 0454  Weight: 49 kg 54.3 kg     Physical examination:    General:  AAO x 3,  cooperative, distress complaining of pelvic pain  HEENT:  Normocephalic, PERRL, otherwise with in Normal limits   Neuro:  CNII-XII intact. , normal motor and sensation, reflexes intact   Lungs:   Clear to auscultation BL, Respirations unlabored,  No wheezes / crackles  Cardio:    S1/S2, RRR, No murmure, No Rubs or Gallops   Abdomen:  Soft, non-tender, bowel sounds active all four quadrants, no guarding or peritoneal signs.  Muscular  skeletal:   Hips/pelvic pain Limited exam -global generalized weaknesses - in bed, able to move all 4 extremities,  -Chronic left-sided lower upper extremity weakness with mild contraction of upper extremity 2+ pulses,  symmetric, No pitting edema  Skin:  Dry, warm to touch, negative for any Rashes,  Wounds: Please see nursing documentation        -----------------------------------------------------------------------------------------------------------------------------    LABs:     Latest Ref Rng & Units 03/28/2023    4:25 AM 03/27/2023    1:02 PM 07/29/2021    2:52 PM  CBC  WBC 4.0 - 10.5 K/uL 18.2  21.6  11.6   Hemoglobin 12.0 - 15.0 g/dL 40.9  81.1  91.4   Hematocrit 36.0 - 46.0 % 32.6  39.8  40.8   Platelets 150 - 400 K/uL 238  285  314  Latest Ref Rng & Units 03/28/2023    4:25 AM 03/27/2023    1:02 PM 09/23/2021   10:54 AM  CMP  Glucose 70 - 99 mg/dL 161  096    BUN 8 - 23 mg/dL 16  19    Creatinine 0.45 - 1.00 mg/dL 4.09  8.11  9.14   Sodium 135 - 145 mmol/L 136  135    Potassium 3.5 - 5.1 mmol/L 3.9  3.3    Chloride 98 - 111 mmol/L 101  95    CO2 22 - 32 mmol/L 26  28    Calcium 8.9 - 10.3 mg/dL 8.8  9.3         Micro Results No results found for this or any previous visit (from the past 240 hour(s)).  Radiology Reports CT PELVIS WO CONTRAST  Result Date: 03/27/2023 CLINICAL DATA:  Pelvic fracture, fall. EXAM: CT PELVIS WITHOUT CONTRAST TECHNIQUE: Multidetector CT imaging of the pelvis was performed following the standard protocol without intravenous contrast. RADIATION DOSE REDUCTION: This exam was performed according to the departmental dose-optimization program which includes automated exposure control, adjustment of the mA and/or kV according to patient size and/or use of iterative reconstruction technique. COMPARISON:  Right hip x-ray 03/27/2023. CT abdomen and pelvis 09/23/2021. FINDINGS: Urinary Tract:  No abnormality visualized. Bowel:  No acute findings.   Sigmoid colon diverticulosis present. Vascular/Lymphatic: Atherosclerotic calcifications are present. No pathologically enlarged lymph nodes. Reproductive:  No mass or other significant abnormality Other: There is a small amount of extraperitoneal pelvic hemorrhage on the right. Musculoskeletal: Bones are osteopenic. There is an acute comminuted fracture of the right superior pubic ramus medially extending to the pubic symphysis. There is an acute nondisplaced fracture through the right inferior pubic ramus. There is no dislocation. There is intramuscular edema and hemorrhage surrounding the fracture sites. There are mild degenerative changes of the left hip and moderate severe degenerative changes of the right hip. Degenerative changes also affect the visualized lower lumbar spine. IMPRESSION: 1. Acute comminuted fracture of the right superior pubic ramus extending to the pubic symphysis. 2. Acute nondisplaced fracture of the right inferior pubic ramus. 3. Small amount of extraperitoneal pelvic hemorrhage on the right. Electronically Signed   By: Darliss Cheney M.D.   On: 03/27/2023 15:40   DG Chest Port 1 View  Result Date: 03/27/2023 CLINICAL DATA:  Fall today.  Low back and left groin pain. EXAM: PORTABLE CHEST 1 VIEW COMPARISON:  Chest radiographs 03/20/2013. FINDINGS: 1405 hours. The heart size and mediastinal contours are stable without evidence of mediastinal hematoma. There is mild aortic atherosclerosis. The lungs are clear. There is no pleural effusion or pneumothorax. No acute fractures are identified. There are densely calcified breast implants bilaterally. IMPRESSION: No evidence of acute chest injury or active cardiopulmonary process. Aortic atherosclerosis. Electronically Signed   By: Carey Bullocks M.D.   On: 03/27/2023 14:23   DG Hip Unilat With Pelvis 2-3 Views Right  Result Date: 03/27/2023 CLINICAL DATA:  Fall today.  Reported low back and left groin pain. EXAM: DG HIP (WITH OR WITHOUT  PELVIS) 2-3V RIGHT COMPARISON:  Pelvic CT 09/23/2021. FINDINGS: The bones are demineralized. There are acute appearing right parasymphyseal and superior pubic rami fractures. No other acute fractures are identified. Advanced asymmetric right hip osteoarthritis again noted with bone-on-bone apposition. There is no evidence of proximal femur fracture, dislocation or osteonecrosis. There are coarse calcifications in the soft tissues of the proximal right thigh which are likely related to remote  trauma. Aortoiliac atherosclerosis noted. IMPRESSION: Acute appearing right parasymphyseal and superior pubic rami fractures. No evidence of proximal femur fracture or dislocation. Electronically Signed   By: Carey Bullocks M.D.   On: 03/27/2023 14:22    SIGNED: Kendell Bane, MD, FHM. FAAFP. [facility] - [facility] Time spent - 45 min.  In seeing, evaluating and examining the patient. Reviewing medical records, labs, drawn plan of care. [facility],  Pager (please use [email or web address] to page/ text) Please use Epic Secure Chat for non-urgent communication (7AM-7PM)  If 7PM-7AM, please contact night-coverage [email or web address], 03/28/2023, 10:40 AM

## 2023-03-29 ENCOUNTER — Inpatient Hospital Stay (HOSPITAL_COMMUNITY): Payer: Medicare Other

## 2023-03-29 DIAGNOSIS — D638 Anemia in other chronic diseases classified elsewhere: Secondary | ICD-10-CM | POA: Diagnosis present

## 2023-03-29 DIAGNOSIS — S32409A Unspecified fracture of unspecified acetabulum, initial encounter for closed fracture: Secondary | ICD-10-CM | POA: Diagnosis not present

## 2023-03-29 LAB — CBC
HCT: 24.9 % — ABNORMAL LOW (ref 36.0–46.0)
Hemoglobin: 8.1 g/dL — ABNORMAL LOW (ref 12.0–15.0)
MCH: 30.7 pg (ref 26.0–34.0)
MCHC: 32.5 g/dL (ref 30.0–36.0)
MCV: 94.3 fL (ref 80.0–100.0)
Platelets: 208 10*3/uL (ref 150–400)
RBC: 2.64 MIL/uL — ABNORMAL LOW (ref 3.87–5.11)
RDW: 13.2 % (ref 11.5–15.5)
WBC: 20.2 10*3/uL — ABNORMAL HIGH (ref 4.0–10.5)
nRBC: 0 % (ref 0.0–0.2)

## 2023-03-29 LAB — GLUCOSE, CAPILLARY
Glucose-Capillary: 99 mg/dL (ref 70–99)
Glucose-Capillary: 122 mg/dL — ABNORMAL HIGH (ref 70–99)
Glucose-Capillary: 107 mg/dL — ABNORMAL HIGH (ref 70–99)

## 2023-03-29 LAB — BASIC METABOLIC PANEL
Anion gap: 10 (ref 5–15)
BUN: 15 mg/dL (ref 8–23)
CO2: 22 mmol/L (ref 22–32)
Calcium: 8.2 mg/dL — ABNORMAL LOW (ref 8.9–10.3)
Chloride: 104 mmol/L (ref 98–111)
Creatinine, Ser: 0.92 mg/dL (ref 0.44–1.00)
GFR, Estimated: >60 mL/min (ref >60)
Glucose, Bld: 142 mg/dL — ABNORMAL HIGH (ref 70–99)
Potassium: 4.2 mmol/L (ref 3.5–5.1)
Sodium: 136 mmol/L (ref 135–145)

## 2023-03-29 LAB — IRON AND TIBC
Iron: 16 ug/dL — ABNORMAL LOW (ref 28–170)
Saturation Ratios: 7 % — ABNORMAL LOW (ref 10.4–31.8)
TIBC: 232 ug/dL — ABNORMAL LOW (ref 250–450)
UIBC: 216 ug/dL

## 2023-03-29 NOTE — Plan of Care (Signed)
  Problem: Acute Rehab OT Goals (only OT should resolve) Goal: Pt. Will Perform Grooming Flowsheets (Taken 03/29/2023 0924) Pt Will Perform Grooming:  with min assist  standing Goal: Pt. Will Perform Upper Body Dressing Flowsheets (Taken 03/29/2023 0924) Pt Will Perform Upper Body Dressing:  with modified independence  sitting Goal: Pt. Will Perform Lower Body Dressing Flowsheets (Taken 03/29/2023 0924) Pt Will Perform Lower Body Dressing:  with min guard assist  with min assist  sitting/lateral leans Goal: Pt. Will Transfer To Toilet Flowsheets (Taken 03/29/2023 [phone]) Pt Will Transfer to Toilet:  with modified independence  ambulating Goal: Pt. Will Perform Toileting-Clothing Manipulation Flowsheets (Taken 03/29/2023 0924) Pt Will Perform Toileting - Clothing Manipulation and hygiene:  with modified independence  sitting/lateral leans Goal: Pt/Caregiver Will Perform Home Exercise Program Flowsheets (Taken 03/29/2023 [phone]) Pt/caregiver will Perform Home Exercise Program:  Increased strength  Both right and left upper extremity  Independently  Drequan Ironside OT, MOT

## 2023-03-29 NOTE — Assessment & Plan Note (Signed)
-  Chronic anemia in the setting of pelvic fracture pelvic hematoma - Noted drop in H&H, monitoring closely  Repeat CT scan of pelvis was reviewed, noted for hematoma, with fractures Discussed the case with orthopedic Dr. Romeo Apple -No surgical management, continue to monitor, transfuse PRBC as needed, hold DVT prophylaxis, incentive      Latest Ref Rng & Units 03/30/2023    8:08 AM 03/30/2023    4:11 AM 03/29/2023    4:57 AM  CBC  WBC 4.0 - 10.5 K/uL  15.0  20.2   Hemoglobin 12.0 - 15.0 g/dL 7.2  7.1  8.1   Hematocrit 36.0 - 46.0 % 22.2  22.2  24.9   Platelets 150 - 400 K/uL  208  208    -Withholding prophylaxis Lovenox  -Discussed with patient, pursuing with 2U PRBC blood transfusion Pros and cons of blood pressure patient has been discussed-patient is agreeable

## 2023-03-29 NOTE — Evaluation (Signed)
Occupational Therapy Evaluation Patient Details Name: Catherine Gonzalez MRN: [id number] DOB: Oct 27, 1951 Today's Date: 03/29/2023   History of Present Illness Catherine Gonzalez is a 72 year old female with a past medical history of HTN, hypothyroidism, remote childhood history of brain tumor-with residual of left sided weakness with gait abnormality... Presented status post accidental fall and pain to pelvic area post fall.  Patient reports that her fall was accidental as she lost her balance in the kitchen, she did not hit her head, but post fall has been experiencing right groin pain. (per MD)   Clinical Impression   Pt agreeable to OT and PT co-evaluation. Pt independent at baseline and living alone. Today pt required min to mod A for bed mobility and mod A for functional ambulation and transfers due to increased pain and labored movement. Pt has L UE strength and coordination deficits at baseline. Much assist needed for lower body and standing ADL tasks at this time. Pt was left in the chair with call bell within reach. Pt will benefit from continued OT in the hospital and recommended venue below to increase strength, balance, and endurance for safe ADL's.         Recommendations for follow up therapy are one component of a multi-disciplinary discharge planning process, led by the attending physician.  Recommendations may be updated based on patient status, additional functional criteria and insurance authorization.   Assistance Recommended at Discharge Intermittent Supervision/Assistance  Patient can return home with the following A lot of help with walking and/or transfers;A lot of help with bathing/dressing/bathroom;Assistance with cooking/housework;Assist for transportation    Functional Status Assessment  Patient has had a recent decline in their functional status and demonstrates the ability to make significant improvements in function in a reasonable and predictable amount of time.   Equipment Recommendations  None recommended by OT           Precautions / Restrictions Precautions Precautions: Fall Restrictions Weight Bearing Restrictions: Yes RLE Weight Bearing: Weight bearing as tolerated      Mobility Bed Mobility Overal bed mobility: Needs Assistance Bed Mobility: Supine to Sit     Supine to sit: Min assist, Mod assist     General bed mobility comments: slow labored movement; increased pain    Transfers Overall transfer level: Needs assistance Equipment used: Rolling walker (2 wheels) Transfers: Sit to/from Stand, Bed to chair/wheelchair/BSC Sit to Stand: Min assist, Mod assist     Step pivot transfers: Mod assist     General transfer comment: using RW; much increase in pain ; labored movement      Balance Overall balance assessment: Needs assistance Sitting-balance support: No upper extremity supported, Feet supported Sitting balance-Leahy Scale: Good Sitting balance - Comments: seated at EOB   Standing balance support: Bilateral upper extremity supported, During functional activity, Reliant on assistive device for balance Standing balance-Leahy Scale: Poor Standing balance comment: poor to fair with RW                           ADL either performed or assessed with clinical judgement   ADL Overall ADL's : Needs assistance/impaired     Grooming: Set up;Sitting   Upper Body Bathing: Set up;Sitting   Lower Body Bathing: Maximal assistance;Sitting/lateral leans   Upper Body Dressing : Set up;Sitting   Lower Body Dressing: Maximal assistance;Sitting/lateral leans Lower Body Dressing Details (indicate cue type and reason): Pt was able to doff L sock but required assist to  don while seated in the recliner. Toilet Transfer: Moderate assistance;Ambulation;Rolling walker (2 wheels) Toilet Transfer Details (indicate cue type and reason): Simulated via EOB to chair transfer. Toileting- Clothing Manipulation and Hygiene:  Minimal assistance;Sitting/lateral lean       Functional mobility during ADLs: Moderate assistance;Rolling walker (2 wheels) General ADL Comments: Pt able to ambulate ~8 feet in the room with RW.     Vision Baseline Vision/History: 1 Wears glasses Ability to See in Adequate Light: 1 Impaired Patient Visual Report: No change from baseline Vision Assessment?: No apparent visual deficits                Pertinent Vitals/Pain Pain Assessment Pain Assessment: 0-10 Pain Score: 7  Pain Location: R hip area Pain Descriptors / Indicators: Grimacing, Guarding, Moaning Pain Intervention(s): Limited activity within patient's tolerance, Monitored during session, Repositioned, Premedicated before session     Hand Dominance Right   Extremity/Trunk Assessment Upper Extremity Assessment Upper Extremity Assessment: Generalized weakness;LUE deficits/detail LUE Deficits / Details: Deficits in strenth and coordination at baseline from brain tumor damage. LUE Coordination: decreased fine motor;decreased gross motor   Lower Extremity Assessment Lower Extremity Assessment: Defer to PT evaluation   Cervical / Trunk Assessment Cervical / Trunk Assessment: Normal   Communication Communication Communication: No difficulties   Cognition Arousal/Alertness: Awake/alert Behavior During Therapy: WFL for tasks assessed/performed Overall Cognitive Status: Within Functional Limits for tasks assessed                                                        Home Living Family/patient expects to be discharged to:: Private residence Living Arrangements: Alone Available Help at Discharge: Family;Available PRN/intermittently Type of Home: House Home Access: Level entry     Home Layout: One level     Bathroom Shower/Tub: Chief Strategy Officer: Standard Bathroom Accessibility: Yes How Accessible: Accessible via walker Home Equipment: Cane - single point;Other  (comment);Grab bars - toilet;Grab bars - tub/shower;Shower seat Museum/gallery conservator)          Prior Functioning/Environment Prior Level of Function : Independent/Modified Independent             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 ADLs Comments: Independent; cousin drives pt to grocery store.        OT Problem List: Decreased strength;Decreased range of motion;Impaired balance (sitting and/or standing);Decreased activity tolerance;Pain      OT Treatment/Interventions: Self-care/ADL training;Therapeutic exercise;DME and/or AE instruction;Therapeutic activities;Patient/family education;Balance training    OT Goals(Current goals can be found in the care plan section) Acute Rehab OT Goals Patient Stated Goal: Improve function at rehab. OT Goal Formulation: With patient Time For Goal Achievement: 04/12/23 Potential to Achieve Goals: Good  OT Frequency: Min 2X/week    Co-evaluation PT/OT/SLP Co-Evaluation/Treatment: Yes Reason for Co-Treatment: To address functional/ADL transfers   OT goals addressed during session: ADL's and self-care                       End of Session Equipment Utilized During Treatment: Rolling walker (2 wheels);Gait belt  Activity Tolerance: Patient tolerated treatment well Patient left: in chair;with call bell/phone within reach  OT Visit Diagnosis: Unsteadiness on feet (R26.81);Other abnormalities of gait and mobility (R26.89);History of falling (Z91.81);Muscle weakness (generalized) (M62.81)  Time: 8295-6213 OT Time Calculation (min): 26 min Charges:  OT General Charges $OT Visit: 1 Visit OT Evaluation $OT Eval Low Complexity: 1 Low  Derwin Reddy OT, MOT   Danie Chandler 03/29/2023, 9:21 AM

## 2023-03-29 NOTE — Evaluation (Signed)
Physical Therapy Evaluation Patient Details Name: Catherine Gonzalez MRN: [id number] DOB: 1951-01-12 Today's Date: 03/29/2023  History of Present Illness  Catherine Gonzalez is a 72 year old female with a past medical history of HTN, hypothyroidism, remote childhood history of brain tumor-with residual of left sided weakness with gait abnormality... Presented status post accidental fall and pain to pelvic area post fall.  Patient reports that her fall was accidental as she lost her balance in the kitchen, she did not hit her head, but post fall has been experiencing right groin pain.   Clinical Impression  Patient demonstrates slow labored movement for sitting up at bedside with difficulty moving RLE due to increased pain, very unsteady on feet with frequent near loss of balance during gait training due to poor tolerance weightbearing on RLE and c/o fatigue.  Patient tolerated sitting up in chair after therapy.  Patient will benefit from continued skilled physical therapy in hospital and recommended venue below to increase strength, balance, endurance for safe ADLs and gait.          Recommendations for follow up therapy are one component of a multi-disciplinary discharge planning process, led by the attending physician.  Recommendations may be updated based on patient status, additional functional criteria and insurance authorization.  Follow Up Recommendations Can patient physically be transported by private vehicle: Yes     Assistance Recommended at Discharge    Patient can return home with the following  A lot of help with bathing/dressing/bathroom;A lot of help with walking and/or transfers;Help with stairs or ramp for entrance;Assistance with cooking/housework    Equipment Recommendations Rolling walker (2 wheels)  Recommendations for Other Services       Functional Status Assessment Patient has had a recent decline in their functional status and demonstrates the ability to make  significant improvements in function in a reasonable and predictable amount of time.     Precautions / Restrictions Precautions Precautions: Fall Restrictions Weight Bearing Restrictions: Yes RLE Weight Bearing: Weight bearing as tolerated      Mobility  Bed Mobility Overal bed mobility: Needs Assistance Bed Mobility: Supine to Sit     Supine to sit: Min assist, Mod assist     General bed mobility comments: increased time, labored movement with increased right hip pain    Transfers Overall transfer level: Needs assistance Equipment used: Rolling walker (2 wheels) Transfers: Sit to/from Stand, Bed to chair/wheelchair/BSC Sit to Stand: Min assist, Mod assist   Step pivot transfers: Mod assist       General transfer comment: slow labored movment with increased pain when weightbearing on RLE    Ambulation/Gait Ambulation/Gait assistance: Mod assist Gait Distance (Feet): 15 Feet Assistive device: Rolling walker (2 wheels) Gait Pattern/deviations: Decreased step length - right, Decreased step length - left, Decreased stride length, Decreased stance time - right, Antalgic Gait velocity: decreased     General Gait Details: limited to walking in room with slow labored cadence, increased right hip pain when weightbearing with frequent near loss of balance  Stairs            Wheelchair Mobility    Modified Rankin (Stroke Patients Only)       Balance Overall balance assessment: Needs assistance Sitting-balance support: Feet supported, No upper extremity supported Sitting balance-Leahy Scale: Good Sitting balance - Comments: seated at EOB   Standing balance support: Reliant on assistive device for balance, Bilateral upper extremity supported, During functional activity Standing balance-Leahy Scale: Poor Standing balance comment: fair/poor using RW  Pertinent Vitals/Pain Pain Assessment Pain Assessment: 0-10 Pain  Score: 7  Pain Location: R hip area Pain Descriptors / Indicators: Grimacing, Guarding, Moaning Pain Intervention(s): Limited activity within patient's tolerance, Monitored during session, Premedicated before session, Repositioned    Home Living Family/patient expects to be discharged to:: Private residence Living Arrangements: Alone Available Help at Discharge: Family;Available PRN/intermittently Type of Home: House Home Access: Level entry       Home Layout: One level Home Equipment: Cane - single point;Other (comment);Grab bars - toilet;Grab bars - tub/shower;Shower seat      Prior Function Prior Level of Function : Independent/Modified Independent             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 ADLs Comments: Independent; cousin drives pt to grocery store.     Hand Dominance   Dominant Hand: Right    Extremity/Trunk Assessment   Upper Extremity Assessment Upper Extremity Assessment: Defer to OT evaluation    Lower Extremity Assessment Lower Extremity Assessment: Generalized weakness;RLE deficits/detail;LLE deficits/detail RLE Deficits / Details: grossly 3+/5 RLE: Unable to fully assess due to pain RLE Sensation: WNL RLE Coordination: WNL LLE Deficits / Details: grossly 4-/5 LLE Sensation: WNL LLE Coordination: WNL    Cervical / Trunk Assessment Cervical / Trunk Assessment: Normal  Communication   Communication: No difficulties  Cognition Arousal/Alertness: Awake/alert Behavior During Therapy: WFL for tasks assessed/performed Overall Cognitive Status: Within Functional Limits for tasks assessed                                          General Comments      Exercises     Assessment/Plan    PT Assessment Patient needs continued PT services  PT Problem List Decreased strength;Decreased activity tolerance;Decreased balance;Decreased mobility       PT Treatment Interventions DME instruction;Gait  training;Stair training;Functional mobility training;Therapeutic activities;Therapeutic exercise;Patient/family education;Balance training    PT Goals (Current goals can be found in the Care Plan section)  Acute Rehab PT Goals Patient Stated Goal: return home after rehab PT Goal Formulation: With patient Time For Goal Achievement: 04/12/23 Potential to Achieve Goals: Good    Frequency Min 3X/week     Co-evaluation PT/OT/SLP Co-Evaluation/Treatment: Yes Reason for Co-Treatment: To address functional/ADL transfers PT goals addressed during session: Mobility/safety with mobility;Proper use of DME;Balance         AM-PAC PT "6 Clicks" Mobility  Outcome Measure Help needed turning from your back to your side while in a flat bed without using bedrails?: A Lot Help needed moving from lying on your back to sitting on the side of a flat bed without using bedrails?: A Lot Help needed moving to and from a bed to a chair (including a wheelchair)?: A Lot Help needed standing up from a chair using your arms (e.g., wheelchair or bedside chair)?: A Lot Help needed to walk in hospital room?: A Lot Help needed climbing 3-5 steps with a railing? : A Lot 6 Click Score: 12    End of Session   Activity Tolerance: Patient tolerated treatment well;Patient limited by fatigue Patient left: in chair;with call bell/phone within reach Nurse Communication: Mobility status PT Visit Diagnosis: Unsteadiness on feet (R26.81);Other abnormalities of gait and mobility (R26.89);Muscle weakness (generalized) (M62.81)    Time: 1610-9604 PT Time Calculation (min) (ACUTE ONLY): 24 min   Charges:   PT Evaluation $PT Eval Moderate Complexity: 1 Mod  PT Treatments $Therapeutic Activity: 23-37 mins        2:29 PM, 03/29/23 Ocie Bob, MPT Physical Therapist with [facility] [phone] office [phone] mobile phone

## 2023-03-29 NOTE — TOC Initial Note (Signed)
Transition of Care [facility]) - Initial/Assessment Note    Patient Details  Name: Catherine Gonzalez MRN: [id number] Date of Birth: 1951/03/29  [facility]) CM/SW Contact:    Leitha Bleak, RN Phone Number: 03/29/2023, 12:19 PM  Clinical Narrative:     Patient admitted with pelvis fracture. PT is recommending SNF. Patient is agreeable, wanting PNC. FL2 completed and sent out for bed offers to discuss with patient. CM reached out to [facility], waiting for them to review.               Expected Discharge Plan: Skilled Nursing Facility Barriers to Discharge: Continued Medical Work up   Patient Goals and CMS Choice Patient states their goals for this hospitalization and ongoing recovery are:: agreeable to SNF CMS Medicare.gov Compare Post Acute Care list provided to:: Patient Choice offered to / list presented to : Patient [place] ownership interest in [facility].provided to:: Patient    Expected Discharge Plan and Services       Prior Living Arrangements/Services       Activities of Daily Living Home Assistive Devices/Equipment: Cane (specify quad or straight) ADL Screening (condition at time of admission) Patient's cognitive ability adequate to safely complete daily activities?: Yes Is the patient deaf or have difficulty hearing?: No Does the patient have difficulty seeing, even when wearing glasses/contacts?: No Does the patient have difficulty concentrating, remembering, or making decisions?: No Patient able to express need for assistance with ADLs?: Yes Does the patient have difficulty dressing or bathing?: No Independently performs ADLs?: Yes (appropriate for developmental age) Does the patient have difficulty walking or climbing stairs?: No Weakness of Legs: Right Weakness of Arms/Hands: None  Permission Sought/Granted        Emotional Assessment       Orientation: : Oriented to Self, Oriented to Place, Oriented to  Time, Oriented to  Situation Alcohol / Substance Use: Not Applicable Psych Involvement: No (comment)  Admission diagnosis:  Pelvic fracture (HCC) [S32.9XXA] Leukocytosis, unspecified type [D72.829] Closed fracture of superior ramus of right pubis, initial encounter [facility]) [S32.511A] Patient Active Problem List   Diagnosis Date Noted   Anemia of chronic disease 03/29/2023   Pelvic fracture (HCC) 03/27/2023   Leukocytosis 03/27/2023   Depression 03/27/2023   Hypothyroidism 03/27/2023   Seizures (HCC) 03/27/2023   Syncope and collapse 03/20/2013   Hypokalemia 03/20/2013   Hypertension    PCP:  Benita Stabile, MD Pharmacy:   [facility] [phone] - [place], [place] - [address] AT [facility] [address] [place] [address] Phone: [phone] Fax: [phone]  [id number] - [place], [place] - [address] AT [facility] OF [address] [place] [address] Phone: [phone] Fax: [phone]     Social Determinants of Health (SDOH) Social History: SDOH Screenings   Food Insecurity: No Food Insecurity (03/27/2023)  Housing: Low Risk  (03/27/2023)  Transportation Needs: No Transportation Needs (03/27/2023)  Utilities: Not At Risk (03/27/2023)  Tobacco Use: High Risk (03/27/2023)   SDOH Interventions:     Readmission Risk Interventions     No data to display

## 2023-03-29 NOTE — Progress Notes (Signed)
PROGRESS NOTE    Patient: Catherine Gonzalez                            PCP: Benita Stabile, MD                    DOB: December 13, 1950            DOA: 03/27/2023 ZOX:[id number]             DOS: 03/29/2023, 12:06 PM   LOS: 1 day   Date of Service: The patient was seen and examined on 03/29/2023  Subjective:   The patient was seen and examined this morning, stable.... Still complaining of pain and discomfort Blood pressure stable at 118/71, noted drop in hemoglobin from 10.7-8.1, no signs of bleeding Noted elevated WBC, but remained afebrile, normotensive satting 98% on room air  Brief Narrative:   Catherine Gonzalez is a 72 year old female with a past medical history of HTN, hypothyroidism, remote childhood history of brain tumor-with residual of left sided weakness with gait abnormality... Presented status post accidental fall and pain to pelvic area post fall. Patient reports that her fall was accidental as she lost her balance in the kitchen, she did not hit her head, but post fall has been experiencing right groin pain.  She was able to pull herself up, but could not ambulate due to pain in the right groin and buttocks area. Denies of having any recent illnesses.  ED evaluation/course: Blood pressure (!) 147/85, pulse 98, temperature 98 F (36.7 C), temperature source Oral, resp. rate 18, height 5\' 5"  (1.651 m), weight 49 kg, SpO2 92 %.  CBC WBC 21.6, with neutrophils of 18.6 BMP within normal limits  Chest x-ray-no acute finding  X-ray of the right hip  -acute right parasymphyseal superior pubic rami fracture  Due to significant pain patient was not able to ambulate or be safely discharged Requested for patient to be admitted for PT evaluation, pain management    Assessment & Plan:   Principal Problem:   Pelvic fracture (HCC) Active Problems:   Hypertension   Leukocytosis   Seizures (HCC)   Hypokalemia   Depression   Hypothyroidism   Anemia of chronic  disease     Assessment and Plan: * Pelvic fracture (HCC)  -X-ray of the right hip  -acute right parasymphyseal superior pubic rami fracture -Still complaining of pain and discomfort addressed -Will consult PT OT -Optimizing pain management with p.o. and as needed IV analgesics -Fall precautions -Anticipating discharge to SNF  Seizures (HCC) History of brain tumor on removal over 40 years ago And residual of left upper extremity and left lower extremity weakness with mild contraction Post tumor removal - seizures (last episode over 20 years ago) Controlled on Keppra will continue current dose  Leukocytosis -Likely reactive due to pelvic fracture, fall -Afebrile normotensive, chest x-ray clear, UA within normal limits -No signs of infection we will holding antibiotic use -Will monitor closely  Hypertension -Blood pressure running soft, 108/48 this morning 118/71 - holding home medication of amlodipine and Benicar   Hypothyroidism - TSH normal at 1.076 - continue home dose Synthroid  Depression -Continuing home medication of BuSpar, Atarax  Hypokalemia - Potassium level at 3.3,>> 4.2 today Was repleted with magnesium  Anemia of chronic disease - Noted drop in H&H, monitoring closely - No signs of active bleeding  -Concern for internal pelvic bleeding will obtain a CT abdomen pelvis  Latest Ref Rng & Units 03/29/2023    4:57 AM 03/28/2023    4:25 AM 03/27/2023    1:02 PM  CBC  WBC 4.0 - 10.5 K/uL 20.2  18.2  21.6   Hemoglobin 12.0 - 15.0 g/dL 8.1  16.1  09.6   Hematocrit 36.0 - 46.0 % 24.9  32.6  39.8   Platelets 150 - 400 K/uL 208  238  285    -Withholding prophylaxis Lovenox     ----------------------------------------------------------------------------------------------------------------------------------------------- Nutritional status:  The patient's BMI is: Body mass index is 21.2 kg/m. I agree with the assessment and plan as outlined  ----------------------------------------------------------------------------------------------------------------------  DVT prophylaxis:  TED hose Start: 03/27/23 1446 SCDs Start: 03/27/23 1446   Code Status:   Code Status: Full Code  Family Communication: No family member present at bedside- attempt will be made to update daily The above findings and plan of care has been discussed with patient (and family)  in detail,  they expressed understanding and agreement of above. -Advance care planning has been discussed.   Admission status:   Status is: Inpatient Remains inpatient appropriate because: Needing significant pain management for, with IV analgesics, PT OT evaluation, unable to ambulate   Disposition: From  - home             Planning for discharge in 1-2 days: to SNF  Procedures:   No admission procedures for hospital encounter.   Antimicrobials:  Anti-infectives (From admission, onward)    None        Medication:   busPIRone  5 mg Oral BID   fluticasone  2 spray Each Nare Daily   levETIRAcetam  750 mg Oral BID   levothyroxine  75 mcg Oral QAC breakfast   pantoprazole  40 mg Oral Daily   sodium bicarbonate  325 mg Oral TID AC & HS   sodium chloride flush  3 mL Intravenous Q12H   traMADol  50 mg Oral BID    acetaminophen **OR** acetaminophen, bisacodyl, hydrALAZINE, HYDROmorphone (DILAUDID) injection, hydrOXYzine, ipratropium, levalbuterol, ondansetron **OR** ondansetron (ZOFRAN) IV, oxyCODONE, senna-docusate, sodium phosphate, traZODone   Objective:   Vitals:   03/28/23 2015 03/28/23 2038 03/29/23 0233 03/29/23 0431  BP: 123/62  (!) 108/48 118/71  Pulse: (!) 119 (!) 101 (!) 106 69  Resp: 20  20 20   Temp:    98 F (36.7 C)  TempSrc:    Oral  SpO2: 98%  98% 98%  Weight:    57.8 kg  Height:        Intake/Output Summary (Last 24 hours) at 03/29/2023 1206 Last data filed at 03/29/2023 1100 Gross per 24 hour  Intake 720 ml  Output 451 ml  Net 269 ml    Filed Weights   03/27/23 1252 03/28/23 0454 03/29/23 0431  Weight: 49 kg 54.3 kg 57.8 kg     Physical examination:        General:  AAO x 3,  cooperative, no distress;   HEENT:  Normocephalic, PERRL, otherwise with in Normal limits   Neuro:  CNII-XII intact. , normal motor and sensation, reflexes intact   Lungs:   Clear to auscultation BL, Respirations unlabored,  No wheezes / crackles  Cardio:    S1/S2, RRR, No murmure, No Rubs or Gallops   Abdomen:  Soft, non-tender, bowel sounds active all four quadrants, no guarding or peritoneal signs.  Muscular  skeletal:  Hip/pelvic pain, limited range of motion in lower extremity due to pain  Limited exam -global generalized weaknesses - in  bed, able to move all 4 extremities,   2+ pulses,  symmetric, No pitting edema Chronic left-sided lower upper extremity weakness with mild contraction of upper extremity  Skin:  Dry, warm to touch, negative for any Rashes,  Wounds: Please see nursing documentation         -----------------------------------------------------------------------------------------------------------------------------    LABs:     Latest Ref Rng & Units 03/29/2023    4:57 AM 03/28/2023    4:25 AM 03/27/2023    1:02 PM  CBC  WBC 4.0 - 10.5 K/uL 20.2  18.2  21.6   Hemoglobin 12.0 - 15.0 g/dL 8.1  11.9  14.7   Hematocrit 36.0 - 46.0 % 24.9  32.6  39.8   Platelets 150 - 400 K/uL 208  238  285       Latest Ref Rng & Units 03/29/2023    4:57 AM 03/28/2023    4:25 AM 03/27/2023    1:02 PM  CMP  Glucose 70 - 99 mg/dL 829  562  130   BUN 8 - 23 mg/dL 15  16  19    Creatinine 0.44 - 1.00 mg/dL 8.65  7.84  6.96   Sodium 135 - 145 mmol/L 136  136  135   Potassium 3.5 - 5.1 mmol/L 4.2  3.9  3.3   Chloride 98 - 111 mmol/L 104  101  95   CO2 22 - 32 mmol/L 22  26  28    Calcium 8.9 - 10.3 mg/dL 8.2  8.8  9.3        Micro Results No results found for this or any previous visit (from the past 240  hour(s)).  Radiology Reports No results found.  SIGNED: Kendell Bane, MD, FHM. FAAFP. [facility] - [facility] Time spent - 45 min.  In seeing, evaluating and examining the patient. Reviewing medical records, labs, drawn plan of care. [facility],  Pager (please use [email or web address] to page/ text) Please use Epic Secure Chat for non-urgent communication (7AM-7PM)  If 7PM-7AM, please contact night-coverage [email or web address], 03/29/2023, 12:06 PM

## 2023-03-29 NOTE — Plan of Care (Signed)
  Problem: Acute Rehab PT Goals(only PT should resolve) Goal: Pt Will Go Supine/Side To Sit Outcome: Progressing Flowsheets (Taken 03/29/2023 1430) Pt will go Supine/Side to Sit: with minimal assist Goal: Patient Will Transfer Sit To/From Stand Outcome: Progressing Flowsheets (Taken 03/29/2023 1430) Patient will transfer sit to/from stand: with minimal assist Goal: Pt Will Transfer Bed To Chair/Chair To Bed Outcome: Progressing Flowsheets (Taken 03/29/2023 1430) Pt will Transfer Bed to Chair/Chair to Bed:  with min assist  with mod assist Goal: Pt Will Ambulate Outcome: Progressing Flowsheets (Taken 03/29/2023 1430) Pt will Ambulate:  25 feet  with minimal assist  with moderate assist  with rolling walker   2:31 PM, 03/29/23 Ocie Bob, MPT Physical Therapist with [facility] [phone] office [phone] mobile phone

## 2023-03-29 NOTE — [place] FL2 (Addendum)
MEDICAID FL2 LEVEL OF CARE FORM     IDENTIFICATION  Patient Name: Catherine Gonzalez Birthdate: 23-Sep-1951 Sex: female Admission Date (Current Location): 03/27/2023  [facility] and [place] Number:  [employer] and Address:  [facility],  [address] [place] [address]      Provider Number: [id number]  Attending Physician Name and Address:  Kendell Bane, MD  Relative Name and Phone Number:  Marvel Plan (Other) [phone]    Current Level of Care: Hospital Recommended Level of Care: Skilled Nursing Facility Prior Approval Number:    Date Approved/Denied:   PASRR Number: [id number]  Discharge Plan: SNF    Current Diagnoses: Patient Active Problem List   Diagnosis Date Noted   Pelvic fracture (HCC) 03/27/2023   Leukocytosis 03/27/2023   Depression 03/27/2023   Hypothyroidism 03/27/2023   Seizures (HCC) 03/27/2023   Syncope and collapse 03/20/2013   Hypokalemia 03/20/2013   Hypertension     Orientation RESPIRATION BLADDER Height & Weight     Self, Time, Situation, Place  Normal Continent Weight: 57.8 kg Height:  5\' 5"  (165.1 cm)  BEHAVIORAL SYMPTOMS/MOOD NEUROLOGICAL BOWEL NUTRITION STATUS      Continent Diet (See DC Summary)  AMBULATORY STATUS COMMUNICATION OF NEEDS Skin   Extensive Assist   Normal                       Personal Care Assistance Level of Assistance  Bathing, Feeding, Dressing Bathing Assistance: Maximum assistance Feeding assistance: Independent Dressing Assistance: Limited assistance     Functional Limitations Info  Sight, Hearing, Speech Sight Info: Adequate Hearing Info: Adequate Speech Info: Adequate    SPECIAL CARE FACTORS FREQUENCY  PT (By licensed PT)     PT Frequency: 5 Times a week              Contractures Contractures Info: Not present    Additional Factors Info  Code Status, Allergies Code Status Info: Full Allergies Info: codeine, pegasya           Current  Medications (03/29/2023):  This is the current hospital active medication list Current Facility-Administered Medications  Medication Dose Route Frequency Provider Last Rate Last Admin   0.9 %  sodium chloride infusion   Intravenous Continuous Shetara Launer A, MD   Stopped at 03/27/23 1656   acetaminophen (TYLENOL) tablet 650 mg  650 mg Oral Q6H PRN Nija Koopman A, MD       Or   acetaminophen (TYLENOL) suppository 650 mg  650 mg Rectal Q6H PRN Aquila Delaughter A, MD       bisacodyl (DULCOLAX) EC tablet 5 mg  5 mg Oral Daily PRN Riham Polyakov A, MD       busPIRone (BUSPAR) tablet 5 mg  5 mg Oral BID Mayvis Agudelo A, MD   5 mg at 03/29/23 0829   enoxaparin (LOVENOX) injection 40 mg  40 mg Subcutaneous Q24H Kaitlynne Wenz A, MD   40 mg at 03/28/23 2026   fluticasone (FLONASE) 50 MCG/ACT nasal spray 2 spray  2 spray Each Nare Daily Mohanad Carsten A, MD   2 spray at 03/29/23 0833   hydrALAZINE (APRESOLINE) injection 10 mg  10 mg Intravenous Q4H PRN Ozetta Flatley A, MD       HYDROmorphone (DILAUDID) injection 1 mg  1 mg Intravenous Q2H PRN Monserratt Knezevic A, MD   1 mg at 03/28/23 2343   hydrOXYzine (ATARAX) tablet 25 mg  25 mg Oral BID PRN  Nevin Bloodgood A, MD   25 mg at 03/28/23 2024   ipratropium (ATROVENT) nebulizer solution 0.5 mg  0.5 mg Nebulization Q6H PRN Joeli Fenner A, MD       levalbuterol (XOPENEX) nebulizer solution 0.63 mg  0.63 mg Nebulization Q6H PRN Sarah Zerby A, MD       levETIRAcetam (KEPPRA) tablet 750 mg  750 mg Oral BID Anna Livers A, MD   750 mg at 03/29/23 8119   levothyroxine (SYNTHROID) tablet 75 mcg  75 mcg Oral QAC breakfast Nevin Bloodgood A, MD   75 mcg at 03/29/23 0448   ondansetron (ZOFRAN) tablet 4 mg  4 mg Oral Q6H PRN Nevin Bloodgood A, MD   4 mg at 03/28/23 1442   Or   ondansetron (ZOFRAN) injection 4 mg  4 mg Intravenous Q6H PRN Nevin Bloodgood A, MD   4 mg at 03/28/23 1478   oxyCODONE (Oxy IR/ROXICODONE) immediate release  tablet 5 mg  5 mg Oral Q4H PRN Lillian Ballester A, MD   5 mg at 03/29/23 0232   pantoprazole (PROTONIX) EC tablet 40 mg  40 mg Oral Daily Dyon Rotert A, MD   40 mg at 03/29/23 0830   senna-docusate (Senokot-S) tablet 1 tablet  1 tablet Oral QHS PRN Loui Massenburg A, MD       sodium bicarbonate tablet 325 mg  325 mg Oral TID AC & HS Tricha Ruggirello A, MD   325 mg at 03/29/23 0829   sodium chloride flush (NS) 0.9 % injection 3 mL  3 mL Intravenous Q12H Corben Auzenne A, MD   3 mL at 03/29/23 2956   sodium phosphate (FLEET) 7-19 GM/118ML enema 1 enema  1 enema Rectal Once PRN Norleen Xie A, MD       traMADol (ULTRAM) tablet 50 mg  50 mg Oral BID Nevin Bloodgood A, MD   50 mg at 03/29/23 0829   traZODone (DESYREL) tablet 25 mg  25 mg Oral QHS PRN Kendell Bane, MD   25 mg at 03/28/23 2025     Discharge Medications: Please see discharge summary for a list of discharge medications.  Relevant Imaging Results:  Relevant Lab Results:   Additional Information SS# [id number]  Leitha Bleak, RN

## 2023-03-29 NOTE — TOC Progression Note (Signed)
Transition of Care [facility]) - Progression Note    Patient Details  Name: Catherine Gonzalez MRN: [id number] Date of Birth: 11-06-1951  Transition of Care [facility]) CM/SW Contact  Leitha Bleak, RN Phone Number: 03/29/2023, 5:05 PM  Clinical Narrative:   Discussed bed offers. Patient is agreeable to [facility] rehab. Revonda Standard updated. Patient needs 3 midnights unless [facility] will give a waiver. TOC emailed [email or web address]. MD updated.    Expected Discharge Plan: Skilled Nursing Facility Barriers to Discharge: Continued Medical Work up  Expected Discharge Plan and Services       Social Determinants of Health (SDOH) Interventions SDOH Screenings   Food Insecurity: No Food Insecurity (03/27/2023)  Housing: Low Risk  (03/27/2023)  Transportation Needs: No Transportation Needs (03/27/2023)  Utilities: Not At Risk (03/27/2023)  Tobacco Use: High Risk (03/27/2023)    Readmission Risk Interventions     No data to display

## 2023-03-30 DIAGNOSIS — S32409A Unspecified fracture of unspecified acetabulum, initial encounter for closed fracture: Secondary | ICD-10-CM | POA: Diagnosis not present

## 2023-03-30 LAB — BASIC METABOLIC PANEL
Anion gap: 8 (ref 5–15)
BUN: 16 mg/dL (ref 8–23)
CO2: 23 mmol/L (ref 22–32)
Calcium: 8.1 mg/dL — ABNORMAL LOW (ref 8.9–10.3)
Chloride: 104 mmol/L (ref 98–111)
Creatinine, Ser: 0.86 mg/dL (ref 0.44–1.00)
GFR, Estimated: >60 mL/min (ref >60)
Glucose, Bld: 134 mg/dL — ABNORMAL HIGH (ref 70–99)
Potassium: 4 mmol/L (ref 3.5–5.1)
Sodium: 135 mmol/L (ref 135–145)

## 2023-03-30 LAB — BPAM RBC: Blood Product Expiration Date: 202406012359

## 2023-03-30 LAB — CBC
HCT: 22.2 % — ABNORMAL LOW (ref 36.0–46.0)
Hemoglobin: 7.1 g/dL — ABNORMAL LOW (ref 12.0–15.0)
MCH: 30.5 pg (ref 26.0–34.0)
MCHC: 32 g/dL (ref 30.0–36.0)
MCV: 95.3 fL (ref 80.0–100.0)
Platelets: 208 10*3/uL (ref 150–400)
RBC: 2.33 MIL/uL — ABNORMAL LOW (ref 3.87–5.11)
RDW: 13.6 % (ref 11.5–15.5)
WBC: 15 10*3/uL — ABNORMAL HIGH (ref 4.0–10.5)
nRBC: 0 % (ref 0.0–0.2)

## 2023-03-30 LAB — GLUCOSE, CAPILLARY
Glucose-Capillary: 95 mg/dL (ref 70–99)
Glucose-Capillary: 101 mg/dL — ABNORMAL HIGH (ref 70–99)

## 2023-03-30 LAB — TYPE AND SCREEN
ABO/RH(D): O POS
ABO/RH(D): O POS
Antibody Screen: NEGATIVE
Unit division: 0
Unit division: 0

## 2023-03-30 LAB — PREPARE RBC (CROSSMATCH)

## 2023-03-30 LAB — HEMOGLOBIN AND HEMATOCRIT, BLOOD
HCT: 22.2 % — ABNORMAL LOW (ref 36.0–46.0)
Hemoglobin: 7.2 g/dL — ABNORMAL LOW (ref 12.0–15.0)

## 2023-03-30 MED ORDER — FUROSEMIDE 10 MG/ML IJ SOLN
20.0000 mg | Freq: Once | INTRAMUSCULAR | Status: AC
  Administered 2023-03-30: 20 mg via INTRAVENOUS
  Filled 2023-03-30: qty 2

## 2023-03-30 MED ORDER — SODIUM CHLORIDE 0.9% IV SOLUTION: Freq: Once | INTRAVENOUS | Status: AC

## 2023-03-30 MED ORDER — TRAZODONE HCL 50 MG PO TABS
100.0000 mg | ORAL_TABLET | Freq: Every evening | ORAL | Status: DC | PRN
  Administered 2023-03-30: 100 mg via ORAL
  Filled 2023-03-30: qty 2

## 2023-03-30 MED ORDER — ACETAMINOPHEN 325 MG PO TABS
650.0000 mg | ORAL_TABLET | Freq: Once | ORAL | Status: AC
  Administered 2023-03-30: 650 mg via ORAL
  Filled 2023-03-30: qty 2

## 2023-03-30 NOTE — Progress Notes (Signed)
Patient ID: Catherine Gonzalez, female   DOB: 08/19/1951, 72 y.o.   MRN: [id number]  Dr. Cherlynn Polo ED asked me to place a note on the chart regarding this patient's pelvic fracture as there was concern that the hematoma had expanded  After reviewing the imaging the patient has a mildly displaced pubic ramus fracture.  The initial CT showed a hematoma expected and then a second CT showed a slightly larger hematoma also expected  The fracture is not considered a surgical fracture  The patient can be weightbearing as tolerated. Pain control is necessary to allow that to happen Hemoglobin can be monitored and if necessary depending on vital signs and hemodynamic parameters transfusion can be initiated  If formal consultation is needed place order for full consult

## 2023-03-30 NOTE — Progress Notes (Signed)
Patient's oxygen saturation was in the 70's when nurse tech checked vitals. Applied nasal cannula with 2L oxygen, patient's sat came up to 98 percent.

## 2023-03-30 NOTE — TOC Progression Note (Signed)
Transition of Care [facility]) - Progression Note    Patient Details  Name: Catherine Gonzalez MRN: [id number] Date of Birth: 10-Jun-1951  Transition of Care [facility]) CM/SW Contact  Elliot Gault, LCSW Phone Number: 03/30/2023, 10:20 AM  Clinical Narrative:     TOC following. Per MD, pt needing blood transfusion and not stable for dc today. Updated Jill Side at [facility].  TOC will follow.  Expected Discharge Plan: Skilled Nursing Facility Barriers to Discharge: Continued Medical Work up  Expected Discharge Plan and Services                                               Social Determinants of Health (SDOH) Interventions SDOH Screenings   Food Insecurity: No Food Insecurity (03/27/2023)  Housing: Low Risk  (03/27/2023)  Transportation Needs: No Transportation Needs (03/27/2023)  Utilities: Not At Risk (03/27/2023)  Tobacco Use: High Risk (03/27/2023)    Readmission Risk Interventions     No data to display

## 2023-03-30 NOTE — Progress Notes (Signed)
Patient slept most of the night during this shift. Patient was very lethargic at 2200 hour and PO medication were not given. Patient became more awake during the night and had one episode of yelling out for help. Patient was redirected and has been calm since and wanted to read the newspaper. Patient took her 0600 PO medications and tolerated well. Plan of care on going.

## 2023-03-30 NOTE — Progress Notes (Signed)
PROGRESS NOTE    Patient: Catherine Gonzalez                            PCP: Benita Stabile, MD                    DOB: 1951-10-23            DOA: 03/27/2023 ZOX:[id number]             DOS: 03/30/2023, 11:47 AM   LOS: 2 days   Date of Service: The patient was seen and examined on 03/30/2023  Subjective:   The patient was seen and examined this morning. Heart rate 101, blood pressure soft at 104/61.  Hemoglobin dropped to 7.1 Continues to have pain, needing IV analgesics Satting 93% on 2 L of oxygen   Brief Narrative:   Catherine Gonzalez is a 72 year old female with a past medical history of HTN, hypothyroidism, remote childhood history of brain tumor-with residual of left sided weakness with gait abnormality... Presented status post accidental fall and pain to pelvic area post fall. Patient reports that her fall was accidental as she lost her balance in the kitchen, she did not hit her head, but post fall has been experiencing right groin pain.  She was able to pull herself up, but could not ambulate due to pain in the right groin and buttocks area. Denies of having any recent illnesses.  ED evaluation/course: Blood pressure (!) 147/85, pulse 98, temperature 98 F (36.7 C), temperature source Oral, resp. rate 18, height 5\' 5"  (1.651 m), weight 49 kg, SpO2 92 %.  CBC WBC 21.6, with neutrophils of 18.6 BMP within normal limits  Chest x-ray-no acute finding  X-ray of the right hip  -acute right parasymphyseal superior pubic rami fracture  Due to significant pain patient was not able to ambulate or be safely discharged Requested for patient to be admitted for PT evaluation, pain management    Assessment & Plan:   Principal Problem:   Pelvic fracture (HCC) Active Problems:   Hypertension   Leukocytosis   Seizures (HCC)   Hypokalemia   Depression   Hypothyroidism   Anemia of chronic disease     Assessment and Plan: * Pelvic fracture (HCC)  -X-ray of the right hip   -acute right parasymphyseal superior pubic rami fracture  CT of pelvis from yesterday was reviewed, noted for hematoma, with fractures Discussed the case with orthopedic Dr. Romeo Apple -No surgical invention, continue to monitor closely  -Still complaining of pain and discomfort addressed -Will consult PT OT -Optimizing pain management with p.o. and as needed IV analgesics -Fall precautions -Anticipating discharge to SNF  Seizures (HCC) History of brain tumor on removal over 40 years ago And residual of left upper extremity and left lower extremity weakness with mild contraction Post tumor removal - seizures (last episode over 20 years ago) Controlled on Keppra will continue current dose  Leukocytosis -Likely reactive due to pelvic fracture, fall -Afebrile normotensive, chest x-ray clear, UA within normal limits -No signs of infection we will holding antibiotic use -Will monitor closely  Hypertension -Blood pressure running soft, 108/48 this morning 118/71 - holding home medication of amlodipine and Benicar   Hypothyroidism - TSH normal at 1.076 - continue home dose Synthroid  Depression -Continuing home medication of BuSpar, Atarax  Hypokalemia - Potassium level at 3.3,>> 4.2, 4.2  -Potassium was repleted with magnesium   Anemia of chronic disease -  Chronic anemia in the setting of pelvic fracture pelvic hematoma - Noted drop in H&H, monitoring closely  Repeat CT scan of pelvis was reviewed, noted for hematoma, with fractures Discussed the case with orthopedic Dr. Romeo Apple -No surgical management, continue to monitor, transfuse PRBC as needed, hold DVT prophylaxis, incentive      Latest Ref Rng & Units 03/30/2023    8:08 AM 03/30/2023    4:11 AM 03/29/2023    4:57 AM  CBC  WBC 4.0 - 10.5 K/uL  15.0  20.2   Hemoglobin 12.0 - 15.0 g/dL 7.2  7.1  8.1   Hematocrit 36.0 - 46.0 % 22.2  22.2  24.9   Platelets 150 - 400 K/uL  208  208    -Withholding prophylaxis  Lovenox  -Discussed with patient, pursuing with 2U PRBC blood transfusion Pros and cons of blood pressure patient has been discussed-patient is agreeable     ----------------------------------------------------------------------------------------------------------------------------------------------- Nutritional status:  The patient's BMI is: Body mass index is 22.23 kg/m. I agree with the assessment and plan as outlined ----------------------------------------------------------------------------------------------------------------------  DVT prophylaxis:  TED hose Start: 03/27/23 1446 SCDs Start: 03/27/23 1446   Code Status:   Code Status: Full Code  Family Communication: No family member present at bedside- attempt will be made to update daily The above findings and plan of care has been discussed with patient (and family)  in detail,  they expressed understanding and agreement of above. -Advance care planning has been discussed.   Admission status:   Status is: Inpatient Remains inpatient appropriate because: Needing significant pain management for, with IV analgesics, PT OT evaluation, unable to ambulate   Disposition: From  - home             Planning for discharge in 1-2 days: to SNF  Procedures:   No admission procedures for hospital encounter.   Antimicrobials:  Anti-infectives (From admission, onward)    None        Medication:   busPIRone  5 mg Oral BID   fluticasone  2 spray Each Nare Daily   levETIRAcetam  750 mg Oral BID   levothyroxine  75 mcg Oral QAC breakfast   pantoprazole  40 mg Oral Daily   sodium bicarbonate  325 mg Oral TID AC & HS   sodium chloride flush  3 mL Intravenous Q12H    acetaminophen **OR** acetaminophen, bisacodyl, hydrALAZINE, HYDROmorphone (DILAUDID) injection, hydrOXYzine, ipratropium, levalbuterol, ondansetron **OR** ondansetron (ZOFRAN) IV, oxyCODONE, senna-docusate, sodium phosphate, traZODone   Objective:    Vitals:   03/30/23 0737 03/30/23 0831 03/30/23 1010 03/30/23 1026  BP: 118/61 115/74 104/60 104/61  Pulse: (!) 105 86 (!) 108 (!) 101  Resp: 16  16 16   Temp: 98.2 F (36.8 C)  98.2 F (36.8 C) 97.8 F (36.6 C)  TempSrc: Oral  Oral   SpO2: 94% 98% 93%   Weight:      Height:        Intake/Output Summary (Last 24 hours) at 03/30/2023 1147 Last data filed at 03/30/2023 0903 Gross per 24 hour  Intake 1560 ml  Output 600 ml  Net 960 ml   Filed Weights   03/28/23 0454 03/29/23 0431 03/30/23 0500  Weight: 54.3 kg 57.8 kg 60.6 kg     Physical examination:          General:  AAO x 3,  cooperative, no distress;   HEENT:  Normocephalic, PERRL, otherwise with in Normal limits   Neuro:  CNII-XII intact. , normal motor and  sensation, reflexes intact   Lungs:   Clear to auscultation BL, Respirations unlabored,  No wheezes / crackles  Cardio:    S1/S2, RRR, No murmure, No Rubs or Gallops   Abdomen:  Soft, non-tender, bowel sounds active all four quadrants, no guarding or peritoneal signs.  Muscular  skeletal:  Hip pelvic pain, range of motion limited due to pain Limited exam -global generalized weaknesses - in bed, able to move all 4 extremities,   2+ pulses,  symmetric, No pitting edema Chronic left upper and lower extremity weakness with mild contraction of upper extremity-no changes  Skin:  Dry, warm to touch, negative for any Rashes,  Wounds: Please see nursing documentation        -----------------------------------------------------------------------------------------------------------------------------    LABs:     Latest Ref Rng & Units 03/30/2023    8:08 AM 03/30/2023    4:11 AM 03/29/2023    4:57 AM  CBC  WBC 4.0 - 10.5 K/uL  15.0  20.2   Hemoglobin 12.0 - 15.0 g/dL 7.2  7.1  8.1   Hematocrit 36.0 - 46.0 % 22.2  22.2  24.9   Platelets 150 - 400 K/uL  208  208       Latest Ref Rng & Units 03/30/2023    4:11 AM 03/29/2023    4:57 AM 03/28/2023    4:25 AM   CMP  Glucose 70 - 99 mg/dL 161  096  045   BUN 8 - 23 mg/dL 16  15  16    Creatinine 0.44 - 1.00 mg/dL 4.09  8.11  9.14   Sodium 135 - 145 mmol/L 135  136  136   Potassium 3.5 - 5.1 mmol/L 4.0  4.2  3.9   Chloride 98 - 111 mmol/L 104  104  101   CO2 22 - 32 mmol/L 23  22  26    Calcium 8.9 - 10.3 mg/dL 8.1  8.2  8.8        Micro Results No results found for this or any previous visit (from the past 240 hour(s)).  Radiology Reports CT PELVIS WO CONTRAST  Result Date: 03/29/2023 CLINICAL DATA:  Pelvic pain.  History pelvic fracture. EXAM: CT PELVIS WITHOUT CONTRAST TECHNIQUE: Multidetector CT imaging of the pelvis was performed following the standard protocol without intravenous contrast. RADIATION DOSE REDUCTION: This exam was performed according to the departmental dose-optimization program which includes automated exposure control, adjustment of the mA and/or kV according to patient size and/or use of iterative reconstruction technique. COMPARISON:  March 27, 2023. FINDINGS: Urinary Tract: Visualized ureters are unremarkable. Urinary bladder is displaced slightly to the right due to moderate sized left-sided pelvic hematoma which is new since prior exam. Bowel:  Unremarkable visualized pelvic bowel loops. Vascular/Lymphatic: Aortic atherosclerosis.  No adenopathy is noted. Reproductive:  No mass or other significant abnormality Other: There is interval development of moderate size sided pelvic hematoma as well as development of hematoma inferior to the urinary bladder in the pelvis. Musculoskeletal: Grossly stable appearance of comminuted fracture involving right superior pubic ramus and right-sided pubic symphysis. Nondisplaced fracture involving right inferior pubic ramus is also unchanged. Severe degenerative changes seen involving the right hip joint. IMPRESSION: Interval development of moderate size pelvic hematoma to the left of and inferior to urinary bladder. These results will be called  to the ordering clinician or representative by the Radiologist Assistant, and communication documented in the PACS or zVision Dashboard. Stable appearance of comminuted fracture involving right superior pubic ramus and right-sided pubic  symphysis. Nondisplaced right inferior pubic ramus fracture is also noted. Electronically Signed   By: Lupita Raider M.D.   On: 03/29/2023 15:32    SIGNED: Kendell Bane, MD, FHM. FAAFP. [facility] - [facility] Time spent - 45 min.  In seeing, evaluating and examining the patient. Reviewing medical records, labs, drawn plan of care. [facility],  Pager (please use [email or web address] to page/ text) Please use Epic Secure Chat for non-urgent communication (7AM-7PM)  If 7PM-7AM, please contact night-coverage [email or web address], 03/30/2023, 11:47 AM

## 2023-03-30 NOTE — Progress Notes (Signed)
Mobility Specialist Progress Note:    03/30/23 1315  Mobility  Activity Transferred from bed to chair  Level of Assistance Moderate assist, patient does 50-74%  Assistive Device Front wheel walker  Distance Ambulated (ft) 4 ft  RLE Weight Bearing WBAT  Activity Response Tolerated well  Mobility Referral Yes  $Mobility charge 1 Mobility   Pt eager to get OOB, agreeable to mobility session. Transferred B>C via RW with ModA. Tolerated well, c/o pelvic pain throughout. No report of dizziness, lightheaded or fatigue. Left pt in chair, alarm on, call bell in reach, all needs met.   Feliciana Rossetti Mobility Specialist Please contact via Special educational needs teacher or  Rehab office at [phone]

## 2023-03-31 DIAGNOSIS — S32591A Other specified fracture of right pubis, initial encounter for closed fracture: Secondary | ICD-10-CM | POA: Diagnosis not present

## 2023-03-31 DIAGNOSIS — Z4789 Encounter for other orthopedic aftercare: Secondary | ICD-10-CM | POA: Diagnosis not present

## 2023-03-31 DIAGNOSIS — R5381 Other malaise: Secondary | ICD-10-CM | POA: Diagnosis not present

## 2023-03-31 DIAGNOSIS — S32592A Other specified fracture of left pubis, initial encounter for closed fracture: Secondary | ICD-10-CM

## 2023-03-31 DIAGNOSIS — F331 Major depressive disorder, recurrent, moderate: Secondary | ICD-10-CM | POA: Diagnosis not present

## 2023-03-31 DIAGNOSIS — I10 Essential (primary) hypertension: Secondary | ICD-10-CM | POA: Diagnosis present

## 2023-03-31 DIAGNOSIS — J9601 Acute respiratory failure with hypoxia: Secondary | ICD-10-CM | POA: Diagnosis present

## 2023-03-31 DIAGNOSIS — F339 Major depressive disorder, recurrent, unspecified: Secondary | ICD-10-CM | POA: Diagnosis present

## 2023-03-31 DIAGNOSIS — E876 Hypokalemia: Secondary | ICD-10-CM

## 2023-03-31 DIAGNOSIS — M6281 Muscle weakness (generalized): Secondary | ICD-10-CM | POA: Diagnosis present

## 2023-03-31 DIAGNOSIS — R1319 Other dysphagia: Secondary | ICD-10-CM | POA: Diagnosis present

## 2023-03-31 DIAGNOSIS — J449 Chronic obstructive pulmonary disease, unspecified: Secondary | ICD-10-CM | POA: Diagnosis present

## 2023-03-31 DIAGNOSIS — J438 Other emphysema: Secondary | ICD-10-CM | POA: Diagnosis not present

## 2023-03-31 DIAGNOSIS — R569 Unspecified convulsions: Secondary | ICD-10-CM | POA: Diagnosis present

## 2023-03-31 DIAGNOSIS — S329XXA Fracture of unspecified parts of lumbosacral spine and pelvis, initial encounter for closed fracture: Secondary | ICD-10-CM | POA: Diagnosis not present

## 2023-03-31 DIAGNOSIS — D638 Anemia in other chronic diseases classified elsewhere: Secondary | ICD-10-CM | POA: Diagnosis present

## 2023-03-31 DIAGNOSIS — F411 Generalized anxiety disorder: Secondary | ICD-10-CM | POA: Diagnosis present

## 2023-03-31 DIAGNOSIS — D72829 Elevated white blood cell count, unspecified: Secondary | ICD-10-CM | POA: Diagnosis present

## 2023-03-31 DIAGNOSIS — M1611 Unilateral primary osteoarthritis, right hip: Secondary | ICD-10-CM | POA: Diagnosis not present

## 2023-03-31 DIAGNOSIS — G8194 Hemiplegia, unspecified affecting left nondominant side: Secondary | ICD-10-CM | POA: Diagnosis present

## 2023-03-31 DIAGNOSIS — F419 Anxiety disorder, unspecified: Secondary | ICD-10-CM | POA: Diagnosis not present

## 2023-03-31 DIAGNOSIS — E441 Mild protein-calorie malnutrition: Secondary | ICD-10-CM | POA: Diagnosis present

## 2023-03-31 DIAGNOSIS — Z85841 Personal history of malignant neoplasm of brain: Secondary | ICD-10-CM | POA: Diagnosis not present

## 2023-03-31 DIAGNOSIS — E039 Hypothyroidism, unspecified: Secondary | ICD-10-CM | POA: Diagnosis present

## 2023-03-31 DIAGNOSIS — S329XXD Fracture of unspecified parts of lumbosacral spine and pelvis, subsequent encounter for fracture with routine healing: Secondary | ICD-10-CM | POA: Diagnosis not present

## 2023-03-31 DIAGNOSIS — K219 Gastro-esophageal reflux disease without esophagitis: Secondary | ICD-10-CM | POA: Diagnosis present

## 2023-03-31 LAB — CBC
HCT: 32.5 % — ABNORMAL LOW (ref 36.0–46.0)
Hemoglobin: 10.8 g/dL — ABNORMAL LOW (ref 12.0–15.0)
MCH: 29.3 pg (ref 26.0–34.0)
MCHC: 33.2 g/dL (ref 30.0–36.0)
MCV: 88.1 fL (ref 80.0–100.0)
Platelets: 192 10*3/uL (ref 150–400)
RBC: 3.69 MIL/uL — ABNORMAL LOW (ref 3.87–5.11)
RDW: 16.1 % — ABNORMAL HIGH (ref 11.5–15.5)
WBC: 14 10*3/uL — ABNORMAL HIGH (ref 4.0–10.5)
nRBC: 0 % (ref 0.0–0.2)

## 2023-03-31 LAB — GLUCOSE, CAPILLARY: Glucose-Capillary: 98 mg/dL (ref 70–99)

## 2023-03-31 LAB — BPAM RBC
Blood Product Expiration Date: 202406012359
ISSUE DATE / TIME: 202404301006
ISSUE DATE / TIME: 202404301214
Unit Type and Rh: 5100
Unit Type and Rh: 5100

## 2023-03-31 LAB — BASIC METABOLIC PANEL
Anion gap: 9 (ref 5–15)
BUN: 14 mg/dL (ref 8–23)
CO2: 26 mmol/L (ref 22–32)
Calcium: 8.2 mg/dL — ABNORMAL LOW (ref 8.9–10.3)
Chloride: 100 mmol/L (ref 98–111)
Creatinine, Ser: 0.75 mg/dL (ref 0.44–1.00)
GFR, Estimated: >60 mL/min (ref >60)
Glucose, Bld: 97 mg/dL (ref 70–99)
Potassium: 3.5 mmol/L (ref 3.5–5.1)
Sodium: 135 mmol/L (ref 135–145)

## 2023-03-31 LAB — TYPE AND SCREEN

## 2023-03-31 MED ORDER — OLMESARTAN MEDOXOMIL 20 MG PO TABS: 20.0000 mg | ORAL_TABLET | Freq: Every day | ORAL | 5 refills | Status: DC

## 2023-03-31 MED ORDER — METHOCARBAMOL 750 MG PO TABS: 750.0000 mg | ORAL_TABLET | Freq: Three times a day (TID) | ORAL | 3 refills | Status: DC

## 2023-03-31 MED ORDER — BUDESONIDE-FORMOTEROL FUMARATE 160-4.5 MCG/ACT IN AERO: 2.0000 | INHALATION_SPRAY | Freq: Two times a day (BID) | RESPIRATORY_TRACT | 12 refills | Status: DC

## 2023-03-31 MED ORDER — BUSPIRONE HCL 10 MG PO TABS: 10.0000 mg | ORAL_TABLET | Freq: Two times a day (BID) | ORAL | 3 refills | Status: AC

## 2023-03-31 MED ORDER — OXYCODONE HCL 5 MG PO TABS: 5.0000 mg | ORAL_TABLET | ORAL | 0 refills | Status: DC | PRN

## 2023-03-31 MED ORDER — POLYETHYLENE GLYCOL 3350 17 G PO PACK: 17.0000 g | PACK | Freq: Every day | ORAL | 3 refills | Status: DC

## 2023-03-31 MED ORDER — TRAZODONE HCL 100 MG PO TABS: 100.0000 mg | ORAL_TABLET | Freq: Every day | ORAL | 5 refills | Status: AC

## 2023-03-31 MED ORDER — AMLODIPINE BESYLATE 5 MG PO TABS: 5.0000 mg | ORAL_TABLET | Freq: Every day | ORAL | 3 refills | Status: AC

## 2023-03-31 MED ORDER — SENNOSIDES-DOCUSATE SODIUM 8.6-50 MG PO TABS: 2.0000 | ORAL_TABLET | Freq: Every day | ORAL | 2 refills | Status: DC

## 2023-03-31 MED ORDER — ACETAMINOPHEN 325 MG PO TABS: 650.0000 mg | ORAL_TABLET | Freq: Four times a day (QID) | ORAL | 2 refills | Status: AC | PRN

## 2023-03-31 MED ORDER — ALBUTEROL SULFATE HFA 108 (90 BASE) MCG/ACT IN AERS: 2.0000 | INHALATION_SPRAY | RESPIRATORY_TRACT | 2 refills | Status: DC | PRN

## 2023-03-31 MED ORDER — SODIUM BICARBONATE 325 MG PO TABS: 325.0000 mg | ORAL_TABLET | Freq: Three times a day (TID) | ORAL | 4 refills | Status: AC

## 2023-03-31 NOTE — Progress Notes (Signed)
    SATURATION QUALIFICATIONS: (This note is used to comply with regulatory documentation for home oxygen)   Patient Saturations on Room Air at Rest = 88 %   Patient Saturations on Room Air while Ambulating = 84 %   Patient Saturations on 2 Liters of oxygen while Ambulating = 93 %      Patient needs continuous O2 at 2 L/min continuously via nasal cannula with humidifier, with gaseous portability and conserving device    Dx---COPD Shon Hale, MD

## 2023-03-31 NOTE — Discharge Instructions (Addendum)
1)You need oxygen at home at 2 L via nasal cannula continuously while awake and while asleep--- smoking or having open fires around oxygen can cause fire, significant injury and death  2)Repeat CBC and BMP blood test on Friday, 04/02/2023 advised  3)WBAT---weightbearing as tolerated.  4)Repeat Pelvic Xray--- sometime between May 21 on Apr 24, 2023 (about 3 to 4 weeks from now)

## 2023-03-31 NOTE — Care Management Important Message (Signed)
Important Message  Patient Details  Name: Catherine Gonzalez MRN: [id number] Date of Birth: [birth year]   Medicare Important Message Given:  Yes     Corey Harold 03/31/2023, 1:16 PM

## 2023-03-31 NOTE — Progress Notes (Addendum)
Mobility Specialist Progress Note:    03/31/23 1202  Mobility  Activity Transferred to/from [facility]  Level of Assistance Minimal assist, patient does 75% or more  Assistive Device Other (Comment) (HHA)  Distance Ambulated (ft) 3 ft  RLE Weight Bearing WBAT  Activity Response Tolerated well  Mobility Referral Yes  $Mobility charge 1 Mobility   Pt requested assistance to transfer B>BSC. Tolerated well, unsteady without CGA. MinA required to pivot during transfer. Pt had significant pain in pelvis but states it has improved with mobility. Returned pt back to bed for lunch, all needs met, alarm on.   Feliciana Rossetti Mobility Specialist Please contact via Special educational needs teacher or  Rehab office at [phone]

## 2023-03-31 NOTE — TOC Transition Note (Signed)
Transition of Care [facility]) - CM/SW Discharge Note   Patient Details  Name: Catherine Gonzalez MRN: [id number] Date of Birth: November 09, 1951  Transition of Care [facility]) CM/SW Contact:  Elliot Gault, LCSW Phone Number: 03/31/2023, 12:56 PM   Clinical Narrative:     Pt stable for dc today per MD. Updated Jill Side at [facility] who states they can admit pt today.   Pt remains in agreement with dc plan. Updated pt's cousin, Joyce Gross, at pt request. Joyce Gross will bring pt's belongings to the SNF.  DC clinical sent electronically. RN to call report. EMS arranged.  No other TOC needs for dc.  Final next level of care: Skilled Nursing Facility Barriers to Discharge: Barriers Resolved   Patient Goals and CMS Choice CMS Medicare.gov Compare Post Acute Care list provided to:: Patient Choice offered to / list presented to : Patient  Discharge Placement                Patient chooses bed at: [facility] Patient to be transferred to facility by: EMS Name of family member notified: Joyce Gross Patient and family notified of of transfer: 03/31/23  Discharge Plan and Services Additional resources added to the After Visit Summary for                                       Social Determinants of Health (SDOH) Interventions SDOH Screenings   Food Insecurity: No Food Insecurity (03/27/2023)  Housing: Low Risk  (03/27/2023)  Transportation Needs: No Transportation Needs (03/27/2023)  Utilities: Not At Risk (03/27/2023)  Tobacco Use: High Risk (03/27/2023)     Readmission Risk Interventions     No data to display

## 2023-03-31 NOTE — Progress Notes (Signed)
Pt complained of pain during the night, PRN Oxycodone and Dilaudid given. Pt ambulated with assistance in room multiple times during the night.

## 2023-03-31 NOTE — Discharge Summary (Signed)
Catherine Gonzalez, is a 72 y.o. female  DOB 11-07-1951  MRN [id number].  Admission date:  03/27/2023  Admitting Physician  Kendell Bane, MD  Discharge Date:  03/31/2023   Primary MD  Benita Stabile, MD  Recommendations for primary care physician for things to follow:   1)You need oxygen at home at 2 L via nasal cannula continuously while awake and while asleep--- smoking or having open fires around oxygen can cause fire, significant injury and death  2)Repeat CBC and BMP blood test on Friday, 04/02/2023 advised  3)WBAT---weightbearing as tolerated.  4)Repeat Pelvic Xray--- sometime between May 21 on Apr 24, 2023 (about 3 to 4 weeks from now)  Admission Diagnosis  Pelvic fracture (HCC) [W09.9XXA] Leukocytosis, unspecified type [D72.829] Closed fracture of superior ramus of right pubis, initial encounter (HCC) [S32.511A]   Discharge Diagnosis  Pelvic fracture (HCC) [S32.9XXA] Leukocytosis, unspecified type [D72.829] Closed fracture of superior ramus of right pubis, initial encounter (HCC) [S32.511A]    Principal Problem:   Pelvic fracture (HCC) Active Problems:   Acute hypoxic respiratory failure/COPD   Hypertension   Leukocytosis   Seizures (HCC)   Anemia of chronic disease   COPD (chronic obstructive pulmonary disease) (HCC)   Hypokalemia   Depression   Hypothyroidism      Past Medical History:  Diagnosis Date   Allergic rhinitis    Anemia    Anxiety    Blood transfusion without reported diagnosis    Depression    Ectopic pregnancy    Finger fracture, left    index   GERD (gastroesophageal reflux disease)    Hepatitis C    Hypertension    Hypothyroidism    Seizures (HCC)     Past Surgical History:  Procedure Laterality Date   APPENDECTOMY     BRAIN SURGERY     x 3   BREAST ENHANCEMENT SURGERY     CHOLECYSTECTOMY     CLOSED REDUCTION FINGER WITH PERCUTANEOUS PINNING Left  07/25/2020   Procedure: CLOSED REDUCTION FINGER WITH PERCUTANEOUS PINNING;  Surgeon: Cindee Salt, MD;  Location: [facility];  Service: Orthopedics;  Laterality: Left;  AXILLARY BLOCK   ECTOPIC PREGNANCY SURGERY     LUMBAR SPINE SURGERY     TONSILLECTOMY AND ADENOIDECTOMY       HPI  from the history and physical done on the day of admission:   Catherine Gonzalez is a 72 year old female with a past medical history of HTN, hypothyroidism, remote childhood history of brain tumor-with residual of left sided weakness with gait abnormality... Presented status post accidental fall and pain to pelvic area post fall. Patient reports that her fall was accidental as she lost her balance in the kitchen, she did not hit her head, but post fall has been experiencing right groin pain.   She was able to pull herself up, but could not ambulate due to pain in the right groin and buttocks area. Denies of having any recent illnesses.   ED evaluation/course: Blood pressure (!) 147/85, pulse 98,  temperature 98 F (36.7 C), temperature source Oral, resp. rate 18, height 5\' 5"  (1.651 m), weight 49 kg, SpO2 92 %.   CBC WBC 21.6, with neutrophils of 18.6 BMP within normal limits   Chest x-ray-no acute finding   X-ray of the right hip  -acute right parasymphyseal superior pubic rami fracture   Due to significant pain patient was not able to ambulate or be safely discharged Requested for patient to be admitted for PT evaluation, pain management     Patient Denies having: Fever, Chills, Cough, SOB, Chest Pain, Abd pain, N/V/D, headache, dizziness, lightheadedness,  Dysuria, no other Joint pain, rash, open wounds     Review of Systems: As per HPI, otherwise 10 point review of systems were negative.   Hospital Course:    Assessment and Plan: 1) acute pelvic fractures hematoma -Repeat CT from 03/29/2023 shows acute comminuted fracture of the right superior pubic rami extending into the pubic  symphysis AND -Nondisplaced fracture of the right inferior pubic rami pelvic fracture AND -Moderate size pelvic hematoma to the left and inferior of the urinary bladder -X-ray of the right hip  -acute right parasymphyseal superior pubic rami fracture -Orthopedic surgery recommends conservative management -Weightbearing as tolerated -Continue pain control -Transferring to SNF facility for rehab  2) acute blood loss anemia --due to pelvic fractures with pelvic hematoma -Baseline Hgb usually between 13 and 14 -Chronic anemia in the setting of pelvic fracture pelvic hematoma - Noted drop in H&H, monitoring closely -Patient developed symptomatic anemia with hemoglobin down to 7.1 on 03/30/2023 -Patient received 2 units of PRBC -Post Transfusion Hgb up to 10.8 -Repeat CBC on Friday 04/01/2024  3)Seizures (HCC) History of brain tumor on removal over 40 years ago Some Residual Left upper extremity and left lower extremity weakness with mild contraction Post tumor removal - seizures (last episode over 20 years ago) Controlled on Keppra will continue current dose  4)Leukocytosis -Likely reactive due to pelvic fracture, fall  chest x-ray clear, UA within normal limits -No signs of infection -Repeat CBC on Friday, 04/02/2023  5)Hypertension -Resume amlodipine and Benicar  6)Hypothyroidism - TSH normal at 1.076 - continue home dose Synthroid  7)Depression -Continuing home medication of BuSpar, Atarax  8)Hypokalemia/hypomagnesemia -Replaced -Repeat BMP on Friday, 04/02/2023  9) acute hypoxic respiratory failure in the setting of COPD- -patient quit smoking in February 2024 -Requiring 2 L of oxygen at this time -Bronchodilators and oxygen supplementation as ordered NB!! SATURATION QUALIFICATIONS: (This note is used to comply with regulatory documentation for home oxygen)   Patient Saturations on Room Air at Rest = 88 %   Patient Saturations on Room Air while Ambulating = 84 %   Patient  Saturations on 2 Liters of oxygen while Ambulating = 93 %      Patient needs continuous O2 at 2 L/min continuously via nasal cannula with humidifier, with gaseous portability and conserving device     Dx---COPD  Discharge Condition: stable  Follow UP   Contact information for follow-up providers     Benita Stabile, MD. Schedule an appointment as soon as possible for a visit in 1 month(s).   Specialty: Internal Medicine Contact information: [address] [place] [address] [phone]              Contact information for after-discharge care     Destination     [facility] .   Service: Skilled Nursing Contact information: [address] [place] [address]  [phone]                     Consults obtained - orthopedics  Diet and Activity recommendation:  As advised  Discharge Instructions    Discharge Instructions     Call MD for:  difficulty breathing, headache or visual disturbances   Complete by: As directed    Call MD for:  persistant dizziness or light-headedness   Complete by: As directed    Call MD for:  persistant nausea and vomiting   Complete by: As directed    Call MD for:  redness, tenderness, or signs of infection (pain, swelling, redness, odor or green/yellow discharge around incision site)   Complete by: As directed    Call MD for:  temperature >100.4   Complete by: As directed    Diet - low sodium heart healthy   Complete by: As directed    Discharge instructions   Complete by: As directed    1)You need oxygen at home at 2 L via nasal cannula continuously while awake and while asleep--- smoking or having open fires around oxygen can cause fire, significant injury and death  2)Repeat CBC and BMP blood test on Friday, 04/02/2023 advised  3)WBAT---weightbearing as tolerated.  4)Repeat Pelvic Xray--- sometime between May 21 on Apr 24, 2023 (about 3 to 4 weeks from now)   Increase  activity slowly   Complete by: As directed        Discharge Medications     Allergies as of 03/31/2023       Reactions   Codeine Nausea And Vomiting   Pegasys [peginterferon Alfa-2a] Hives        Medication List     STOP taking these medications    ascorbic acid 500 MG tablet Commonly known as: VITAMIN C   zinc sulfate 220 (50 Zn) MG capsule       TAKE these medications    acetaminophen 325 MG tablet Commonly known as: TYLENOL Take 2 tablets (650 mg total) by mouth every 6 (six) hours as needed for mild pain (or Fever >/= 101).   albuterol 108 (90 Base) MCG/ACT inhaler Commonly known as: VENTOLIN HFA Inhale 2 puffs into the lungs every 4 (four) hours as needed for wheezing or shortness of breath.   amLODipine 5 MG tablet Commonly known as: NORVASC Take 1 tablet (5 mg total) by mouth daily.   budesonide-formoterol 160-4.5 MCG/ACT inhaler Commonly known as: Symbicort Inhale 2 puffs into the lungs 2 (two) times daily.   busPIRone 10 MG tablet Commonly known as: BUSPAR Take 1 tablet (10 mg total) by mouth 2 (two) times daily. What changed:  medication strength how much to take   cholecalciferol 25 MCG (1000 UNIT) tablet Commonly known as: VITAMIN D3 Take 1,000 Units by mouth daily.   estradiol 0.1 MG/GM vaginal cream Commonly known as: ESTRACE APPLY 1 GRAM VAGINALLY 5 TIMES PER WEEK What changed:  how much to take how to take this when to take this   fluticasone 50 MCG/ACT nasal spray Commonly known as: FLONASE Place 2 sprays into both nostrils daily. What changed:  when to take this reasons to take this   levETIRAcetam 750 MG tablet Commonly known as: KEPPRA TAKE 1 TABLET TWICE A DAY What changed:  how much to take how to take this when to take this additional instructions   levocetirizine 5 MG tablet Commonly known as: XYZAL Take 5 mg by mouth daily.   levothyroxine 75 MCG tablet Commonly  known as: SYNTHROID Take 75 mcg by mouth  daily before breakfast.   methocarbamol 750 MG tablet Commonly known as: ROBAXIN Take 1 tablet (750 mg total) by mouth 3 (three) times daily.   olmesartan 20 MG tablet Commonly known as: BENICAR Take 1 tablet (20 mg total) by mouth daily. What changed:  additional instructions Another medication with the same name was removed. Continue taking this medication, and follow the directions you see here.   omeprazole 20 MG capsule Commonly known as: PRILOSEC Take 20 mg by mouth daily.   oxyCODONE 5 MG immediate release tablet Commonly known as: Oxy IR/ROXICODONE Take 1 tablet (5 mg total) by mouth every 4 (four) hours as needed for moderate pain.   polyethylene glycol 17 g packet Commonly known as: MiraLax Take 17 g by mouth daily.   senna-docusate 8.6-50 MG tablet Commonly known as: Senokot-S Take 2 tablets by mouth at bedtime.   sodium bicarbonate 325 MG tablet Take 1 tablet (325 mg total) by mouth 4 (four) times daily -  before meals and at bedtime. What changed:  how much to take when to take this   traZODone 100 MG tablet Commonly known as: DESYREL Take 1 tablet (100 mg total) by mouth at bedtime.       Major procedures and Radiology Reports - PLEASE review detailed and final reports for all details, in brief -   CT PELVIS WO CONTRAST  Result Date: 03/29/2023 CLINICAL DATA:  Pelvic pain.  History pelvic fracture. EXAM: CT PELVIS WITHOUT CONTRAST TECHNIQUE: Multidetector CT imaging of the pelvis was performed following the standard protocol without intravenous contrast. RADIATION DOSE REDUCTION: This exam was performed according to the departmental dose-optimization program which includes automated exposure control, adjustment of the mA and/or kV according to patient size and/or use of iterative reconstruction technique. COMPARISON:  March 27, 2023. FINDINGS: Urinary Tract: Visualized ureters are unremarkable. Urinary bladder is displaced slightly to the right due to  moderate sized left-sided pelvic hematoma which is new since prior exam. Bowel:  Unremarkable visualized pelvic bowel loops. Vascular/Lymphatic: Aortic atherosclerosis.  No adenopathy is noted. Reproductive:  No mass or other significant abnormality Other: There is interval development of moderate size sided pelvic hematoma as well as development of hematoma inferior to the urinary bladder in the pelvis. Musculoskeletal: Grossly stable appearance of comminuted fracture involving right superior pubic ramus and right-sided pubic symphysis. Nondisplaced fracture involving right inferior pubic ramus is also unchanged. Severe degenerative changes seen involving the right hip joint. IMPRESSION: Interval development of moderate size pelvic hematoma to the left of and inferior to urinary bladder. These results will be called to the ordering clinician or representative by the Radiologist Assistant, and communication documented in the PACS or zVision Dashboard. Stable appearance of comminuted fracture involving right superior pubic ramus and right-sided pubic symphysis. Nondisplaced right inferior pubic ramus fracture is also noted. Electronically Signed   By: Lupita Raider M.D.   On: 03/29/2023 15:32   CT PELVIS WO CONTRAST  Result Date: 03/27/2023 CLINICAL DATA:  Pelvic fracture, fall. EXAM: CT PELVIS WITHOUT CONTRAST TECHNIQUE: Multidetector CT imaging of the pelvis was performed following the standard protocol without intravenous contrast. RADIATION DOSE REDUCTION: This exam was performed according to the departmental dose-optimization program which includes automated exposure control, adjustment of the mA and/or kV according to patient size and/or use of iterative reconstruction technique. COMPARISON:  Right hip x-ray 03/27/2023. CT abdomen and pelvis 09/23/2021. FINDINGS: Urinary Tract:  No abnormality visualized. Bowel:  No  acute findings.  Sigmoid colon diverticulosis present. Vascular/Lymphatic: Atherosclerotic  calcifications are present. No pathologically enlarged lymph nodes. Reproductive:  No mass or other significant abnormality Other: There is a small amount of extraperitoneal pelvic hemorrhage on the right. Musculoskeletal: Bones are osteopenic. There is an acute comminuted fracture of the right superior pubic ramus medially extending to the pubic symphysis. There is an acute nondisplaced fracture through the right inferior pubic ramus. There is no dislocation. There is intramuscular edema and hemorrhage surrounding the fracture sites. There are mild degenerative changes of the left hip and moderate severe degenerative changes of the right hip. Degenerative changes also affect the visualized lower lumbar spine. IMPRESSION: 1. Acute comminuted fracture of the right superior pubic ramus extending to the pubic symphysis. 2. Acute nondisplaced fracture of the right inferior pubic ramus. 3. Small amount of extraperitoneal pelvic hemorrhage on the right. Electronically Signed   By: Darliss Cheney M.D.   On: 03/27/2023 15:40   DG Chest Port 1 View  Result Date: 03/27/2023 CLINICAL DATA:  Fall today.  Low back and left groin pain. EXAM: PORTABLE CHEST 1 VIEW COMPARISON:  Chest radiographs 03/20/2013. FINDINGS: 1405 hours. The heart size and mediastinal contours are stable without evidence of mediastinal hematoma. There is mild aortic atherosclerosis. The lungs are clear. There is no pleural effusion or pneumothorax. No acute fractures are identified. There are densely calcified breast implants bilaterally. IMPRESSION: No evidence of acute chest injury or active cardiopulmonary process. Aortic atherosclerosis. Electronically Signed   By: Carey Bullocks M.D.   On: 03/27/2023 14:23   DG Hip Unilat With Pelvis 2-3 Views Right  Result Date: 03/27/2023 CLINICAL DATA:  Fall today.  Reported low back and left groin pain. EXAM: DG HIP (WITH OR WITHOUT PELVIS) 2-3V RIGHT COMPARISON:  Pelvic CT 09/23/2021. FINDINGS: The bones  are demineralized. There are acute appearing right parasymphyseal and superior pubic rami fractures. No other acute fractures are identified. Advanced asymmetric right hip osteoarthritis again noted with bone-on-bone apposition. There is no evidence of proximal femur fracture, dislocation or osteonecrosis. There are coarse calcifications in the soft tissues of the proximal right thigh which are likely related to remote trauma. Aortoiliac atherosclerosis noted. IMPRESSION: Acute appearing right parasymphyseal and superior pubic rami fractures. No evidence of proximal femur fracture or dislocation. Electronically Signed   By: Carey Bullocks M.D.   On: 03/27/2023 14:22    Today   Subjective    Catherine Gonzalez today has no new complaints  -       Hypoxia noted--patient denies chest pain, dizziness, or shortness of breath at rest, no palpitations -Pelvic/hip area pain persist especially with activity and positional change   Patient has been seen and examined prior to discharge   Objective   Blood pressure (!) 151/84, pulse (!) 109, temperature 98.6 F (37 C), resp. rate 20, height 5\' 5"  (1.651 m), weight 59.6 kg, SpO2 94 %.   Intake/Output Summary (Last 24 hours) at 03/31/2023 1140 Last data filed at 03/31/2023 0610 Gross per 24 hour  Intake 1263 ml  Output 700 ml  Net 563 ml   Exam Gen:- Awake Alert, no acute distress  HEENT:- [place].AT, No sclera icterus Nose-  2L/min Neck-Supple Neck,No JVD,.  Lungs-no wheezing,, good air movement bilaterally CV- S1, S2 normal, regular Abd-  +ve B.Sounds, Abd Soft, No tenderness,    Extremity/Skin:- No  edema,   good pulses Psych-affect is appropriate, oriented x3 Neuro-generalized weakness, no new focal deficits, no tremors    Data  Review   CBC w Diff:  Lab Results  Component Value Date   WBC 14.0 (H) 03/31/2023   HGB 10.8 (L) 03/31/2023   HGB 12.1 10/23/2013   HCT 32.5 (L) 03/31/2023   HCT 35.3 10/23/2013   PLT 192 03/31/2023   PLT 252  10/23/2013   LYMPHOPCT 6 03/27/2023   LYMPHOPCT 30.4 10/23/2013   MONOPCT 7 03/27/2023   MONOPCT 8.6 10/23/2013   EOSPCT 0 03/27/2023   EOSPCT 1.4 10/23/2013   BASOPCT 0 03/27/2023   BASOPCT 0.3 10/23/2013   CMP:  Lab Results  Component Value Date   NA 135 03/31/2023   NA 137 10/23/2013   K 3.5 03/31/2023   K 4.3 10/23/2013   CL 100 03/31/2023   CO2 26 03/31/2023   CO2 27 10/23/2013   BUN 14 03/31/2023   BUN 17.8 10/23/2013   CREATININE 0.75 03/31/2023   CREATININE 0.87 06/05/2014   CREATININE 1.0 10/23/2013   PROT 7.8 07/29/2021   PROT 8.2 10/23/2013   ALBUMIN 5.0 07/29/2021   ALBUMIN 4.4 10/23/2013   BILITOT 0.3 07/29/2021   BILITOT 0.24 10/23/2013   ALKPHOS 68 07/29/2021   ALKPHOS 61 10/23/2013   AST 25 07/29/2021   AST 30 10/23/2013   ALT 17 07/29/2021   ALT 19 10/23/2013   Total Discharge time is about 33 minutes  Shon Hale M.D on 03/31/2023 at 11:40 AM  Go to [email or web address] -  for contact info  [facility] - Office  [phone]

## 2023-04-01 ENCOUNTER — Other Ambulatory Visit: Payer: Self-pay | Admitting: *Deleted

## 2023-04-01 NOTE — Patient Outreach (Signed)
Mrs. Tregre admitted to [facility] on 03/31/23.  Verified with [facility] Admissions Coordinator, Mrs. Hyson did not need to utilize the [facility] SNF waiver after all. Mrs. Vanessen had a 3 midnight stay in the hospital.   Will follow for potential [facility] care coordination services as benefit of health plan and PCP.  Raiford Noble, MSN, RN,BSN [facility] Post Acute Care Coordinator [phone] (Direct dial)

## 2023-04-02 DIAGNOSIS — J449 Chronic obstructive pulmonary disease, unspecified: Secondary | ICD-10-CM | POA: Diagnosis not present

## 2023-04-02 DIAGNOSIS — J9601 Acute respiratory failure with hypoxia: Secondary | ICD-10-CM | POA: Diagnosis not present

## 2023-04-02 DIAGNOSIS — E876 Hypokalemia: Secondary | ICD-10-CM | POA: Diagnosis not present

## 2023-04-02 DIAGNOSIS — E039 Hypothyroidism, unspecified: Secondary | ICD-10-CM | POA: Diagnosis not present

## 2023-04-02 DIAGNOSIS — K219 Gastro-esophageal reflux disease without esophagitis: Secondary | ICD-10-CM | POA: Diagnosis not present

## 2023-04-02 DIAGNOSIS — G8194 Hemiplegia, unspecified affecting left nondominant side: Secondary | ICD-10-CM | POA: Diagnosis not present

## 2023-04-02 DIAGNOSIS — S329XXD Fracture of unspecified parts of lumbosacral spine and pelvis, subsequent encounter for fracture with routine healing: Secondary | ICD-10-CM | POA: Diagnosis not present

## 2023-04-02 DIAGNOSIS — D72829 Elevated white blood cell count, unspecified: Secondary | ICD-10-CM | POA: Diagnosis not present

## 2023-04-02 DIAGNOSIS — I10 Essential (primary) hypertension: Secondary | ICD-10-CM | POA: Diagnosis not present

## 2023-04-02 DIAGNOSIS — F411 Generalized anxiety disorder: Secondary | ICD-10-CM | POA: Diagnosis not present

## 2023-04-02 DIAGNOSIS — F339 Major depressive disorder, recurrent, unspecified: Secondary | ICD-10-CM | POA: Diagnosis not present

## 2023-04-02 DIAGNOSIS — D638 Anemia in other chronic diseases classified elsewhere: Secondary | ICD-10-CM | POA: Diagnosis not present

## 2023-04-05 DIAGNOSIS — R5381 Other malaise: Secondary | ICD-10-CM | POA: Diagnosis not present

## 2023-04-05 DIAGNOSIS — S329XXA Fracture of unspecified parts of lumbosacral spine and pelvis, initial encounter for closed fracture: Secondary | ICD-10-CM | POA: Diagnosis not present

## 2023-04-07 ENCOUNTER — Other Ambulatory Visit: Payer: Self-pay | Admitting: *Deleted

## 2023-04-07 DIAGNOSIS — R5381 Other malaise: Secondary | ICD-10-CM | POA: Diagnosis not present

## 2023-04-07 DIAGNOSIS — J449 Chronic obstructive pulmonary disease, unspecified: Secondary | ICD-10-CM | POA: Diagnosis not present

## 2023-04-07 DIAGNOSIS — D72829 Elevated white blood cell count, unspecified: Secondary | ICD-10-CM | POA: Diagnosis not present

## 2023-04-07 DIAGNOSIS — S329XXD Fracture of unspecified parts of lumbosacral spine and pelvis, subsequent encounter for fracture with routine healing: Secondary | ICD-10-CM | POA: Diagnosis not present

## 2023-04-07 DIAGNOSIS — J9601 Acute respiratory failure with hypoxia: Secondary | ICD-10-CM | POA: Diagnosis not present

## 2023-04-07 DIAGNOSIS — E876 Hypokalemia: Secondary | ICD-10-CM | POA: Diagnosis not present

## 2023-04-07 DIAGNOSIS — D638 Anemia in other chronic diseases classified elsewhere: Secondary | ICD-10-CM | POA: Diagnosis not present

## 2023-04-07 NOTE — Patient Outreach (Addendum)
Per [facility] Mrs. Puerto resides in [facility] skilled nursing facility. Screening for potential [facility] care coordination services as benefit of health plan and Primary Care Provider.   Secure communication sent to [place], SNF social for collaboration about transition plans and potential [facility] care coordination needs.   Will continue to follow.   Addendum: Update received from [place], [facility] social worker. Mrs. Cadmus plans to return home alone. Making good progress with therapy. Writer will plan outreach, as appropriate to discuss [facility] care coordination services.  Raiford Noble, MSN, RN,BSN [facility] Post Acute Care Coordinator [phone] (Direct dial)

## 2023-04-08 DIAGNOSIS — F339 Major depressive disorder, recurrent, unspecified: Secondary | ICD-10-CM | POA: Diagnosis not present

## 2023-04-08 DIAGNOSIS — F419 Anxiety disorder, unspecified: Secondary | ICD-10-CM | POA: Diagnosis not present

## 2023-04-14 DIAGNOSIS — S329XXD Fracture of unspecified parts of lumbosacral spine and pelvis, subsequent encounter for fracture with routine healing: Secondary | ICD-10-CM | POA: Diagnosis not present

## 2023-04-14 DIAGNOSIS — K219 Gastro-esophageal reflux disease without esophagitis: Secondary | ICD-10-CM | POA: Diagnosis not present

## 2023-04-14 DIAGNOSIS — D638 Anemia in other chronic diseases classified elsewhere: Secondary | ICD-10-CM | POA: Diagnosis not present

## 2023-04-14 DIAGNOSIS — F411 Generalized anxiety disorder: Secondary | ICD-10-CM | POA: Diagnosis not present

## 2023-04-14 DIAGNOSIS — J449 Chronic obstructive pulmonary disease, unspecified: Secondary | ICD-10-CM | POA: Diagnosis not present

## 2023-04-14 DIAGNOSIS — G8194 Hemiplegia, unspecified affecting left nondominant side: Secondary | ICD-10-CM | POA: Diagnosis not present

## 2023-04-14 DIAGNOSIS — J9601 Acute respiratory failure with hypoxia: Secondary | ICD-10-CM | POA: Diagnosis not present

## 2023-04-14 DIAGNOSIS — I10 Essential (primary) hypertension: Secondary | ICD-10-CM | POA: Diagnosis not present

## 2023-04-15 DIAGNOSIS — F419 Anxiety disorder, unspecified: Secondary | ICD-10-CM | POA: Diagnosis not present

## 2023-04-15 DIAGNOSIS — F339 Major depressive disorder, recurrent, unspecified: Secondary | ICD-10-CM | POA: Diagnosis not present

## 2023-04-21 DIAGNOSIS — F339 Major depressive disorder, recurrent, unspecified: Secondary | ICD-10-CM | POA: Diagnosis not present

## 2023-04-21 DIAGNOSIS — F419 Anxiety disorder, unspecified: Secondary | ICD-10-CM | POA: Diagnosis not present

## 2023-04-23 DIAGNOSIS — D638 Anemia in other chronic diseases classified elsewhere: Secondary | ICD-10-CM | POA: Diagnosis not present

## 2023-04-23 DIAGNOSIS — R5381 Other malaise: Secondary | ICD-10-CM | POA: Diagnosis not present

## 2023-04-23 DIAGNOSIS — F411 Generalized anxiety disorder: Secondary | ICD-10-CM | POA: Diagnosis not present

## 2023-04-23 DIAGNOSIS — E039 Hypothyroidism, unspecified: Secondary | ICD-10-CM | POA: Diagnosis not present

## 2023-04-23 DIAGNOSIS — S329XXD Fracture of unspecified parts of lumbosacral spine and pelvis, subsequent encounter for fracture with routine healing: Secondary | ICD-10-CM | POA: Diagnosis not present

## 2023-04-23 DIAGNOSIS — K219 Gastro-esophageal reflux disease without esophagitis: Secondary | ICD-10-CM | POA: Diagnosis not present

## 2023-04-23 DIAGNOSIS — I10 Essential (primary) hypertension: Secondary | ICD-10-CM | POA: Diagnosis not present

## 2023-04-23 DIAGNOSIS — J449 Chronic obstructive pulmonary disease, unspecified: Secondary | ICD-10-CM | POA: Diagnosis not present

## 2023-04-23 DIAGNOSIS — J9601 Acute respiratory failure with hypoxia: Secondary | ICD-10-CM | POA: Diagnosis not present

## 2023-04-27 DIAGNOSIS — I10 Essential (primary) hypertension: Secondary | ICD-10-CM | POA: Diagnosis not present

## 2023-04-27 DIAGNOSIS — K219 Gastro-esophageal reflux disease without esophagitis: Secondary | ICD-10-CM | POA: Diagnosis not present

## 2023-04-27 DIAGNOSIS — R5381 Other malaise: Secondary | ICD-10-CM | POA: Diagnosis not present

## 2023-04-27 DIAGNOSIS — F411 Generalized anxiety disorder: Secondary | ICD-10-CM | POA: Diagnosis not present

## 2023-04-27 DIAGNOSIS — S329XXA Fracture of unspecified parts of lumbosacral spine and pelvis, initial encounter for closed fracture: Secondary | ICD-10-CM | POA: Diagnosis not present

## 2023-04-27 DIAGNOSIS — S329XXD Fracture of unspecified parts of lumbosacral spine and pelvis, subsequent encounter for fracture with routine healing: Secondary | ICD-10-CM | POA: Diagnosis not present

## 2023-05-05 DIAGNOSIS — J9601 Acute respiratory failure with hypoxia: Secondary | ICD-10-CM | POA: Diagnosis not present

## 2023-05-05 DIAGNOSIS — I10 Essential (primary) hypertension: Secondary | ICD-10-CM | POA: Diagnosis not present

## 2023-05-05 DIAGNOSIS — S329XXD Fracture of unspecified parts of lumbosacral spine and pelvis, subsequent encounter for fracture with routine healing: Secondary | ICD-10-CM | POA: Diagnosis not present

## 2023-05-05 DIAGNOSIS — F411 Generalized anxiety disorder: Secondary | ICD-10-CM | POA: Diagnosis not present

## 2023-05-05 DIAGNOSIS — J449 Chronic obstructive pulmonary disease, unspecified: Secondary | ICD-10-CM | POA: Diagnosis not present

## 2023-05-05 DIAGNOSIS — R5381 Other malaise: Secondary | ICD-10-CM | POA: Diagnosis not present

## 2023-05-05 DIAGNOSIS — K219 Gastro-esophageal reflux disease without esophagitis: Secondary | ICD-10-CM | POA: Diagnosis not present

## 2023-05-05 DIAGNOSIS — S329XXA Fracture of unspecified parts of lumbosacral spine and pelvis, initial encounter for closed fracture: Secondary | ICD-10-CM | POA: Diagnosis not present

## 2023-05-07 ENCOUNTER — Other Ambulatory Visit: Payer: Self-pay | Admitting: *Deleted

## 2023-05-07 DIAGNOSIS — F17210 Nicotine dependence, cigarettes, uncomplicated: Secondary | ICD-10-CM | POA: Diagnosis not present

## 2023-05-07 DIAGNOSIS — E039 Hypothyroidism, unspecified: Secondary | ICD-10-CM | POA: Diagnosis not present

## 2023-05-07 DIAGNOSIS — D62 Acute posthemorrhagic anemia: Secondary | ICD-10-CM | POA: Diagnosis not present

## 2023-05-07 DIAGNOSIS — K219 Gastro-esophageal reflux disease without esophagitis: Secondary | ICD-10-CM | POA: Diagnosis not present

## 2023-05-07 DIAGNOSIS — J449 Chronic obstructive pulmonary disease, unspecified: Secondary | ICD-10-CM | POA: Diagnosis not present

## 2023-05-07 DIAGNOSIS — Z85841 Personal history of malignant neoplasm of brain: Secondary | ICD-10-CM | POA: Diagnosis not present

## 2023-05-07 DIAGNOSIS — J309 Allergic rhinitis, unspecified: Secondary | ICD-10-CM | POA: Diagnosis not present

## 2023-05-07 DIAGNOSIS — F419 Anxiety disorder, unspecified: Secondary | ICD-10-CM | POA: Diagnosis not present

## 2023-05-07 DIAGNOSIS — D72829 Elevated white blood cell count, unspecified: Secondary | ICD-10-CM | POA: Diagnosis not present

## 2023-05-07 DIAGNOSIS — J9601 Acute respiratory failure with hypoxia: Secondary | ICD-10-CM | POA: Diagnosis not present

## 2023-05-07 DIAGNOSIS — I10 Essential (primary) hypertension: Secondary | ICD-10-CM | POA: Diagnosis not present

## 2023-05-07 DIAGNOSIS — Z79899 Other long term (current) drug therapy: Secondary | ICD-10-CM | POA: Diagnosis not present

## 2023-05-07 DIAGNOSIS — F32A Depression, unspecified: Secondary | ICD-10-CM | POA: Diagnosis not present

## 2023-05-07 DIAGNOSIS — R569 Unspecified convulsions: Secondary | ICD-10-CM | POA: Diagnosis not present

## 2023-05-07 DIAGNOSIS — S32511D Fracture of superior rim of right pubis, subsequent encounter for fracture with routine healing: Secondary | ICD-10-CM | POA: Diagnosis not present

## 2023-05-07 DIAGNOSIS — Z9181 History of falling: Secondary | ICD-10-CM | POA: Diagnosis not present

## 2023-05-07 NOTE — Patient Outreach (Addendum)
Per [facility]  Catherine Gonzalez discharged from [facility] skilled nursing facility on 05/01/23. Screening for potential [facility] care coordination services as benefit of health plan and  Primary Care Provider.  Telephone call made to Catherine Gonzalez at [phone] to discuss [facility] care coordination needs. No answer. HIPAA compliant voicemail message left to request return call.   Addendum: Telephone call received from Catherine Gonzalez. Patient identifiers confirmed. Catherine Gonzalez reports home health started today. States she lives alone but is managing well. States PCP appointment is scheduled for 05/12/23. States her cousin provides transportation to appointments. Denies any concerns with meals or prescriptions. Denies having any [facility] care coordination needs at this time.    Raiford Noble, MSN, RN,BSN [facility] Post Acute Care Coordinator [phone] (Direct dial)

## 2023-05-13 DIAGNOSIS — S32511D Fracture of superior rim of right pubis, subsequent encounter for fracture with routine healing: Secondary | ICD-10-CM | POA: Diagnosis not present

## 2023-05-13 DIAGNOSIS — J449 Chronic obstructive pulmonary disease, unspecified: Secondary | ICD-10-CM | POA: Diagnosis not present

## 2023-05-13 DIAGNOSIS — K219 Gastro-esophageal reflux disease without esophagitis: Secondary | ICD-10-CM | POA: Diagnosis not present

## 2023-05-13 DIAGNOSIS — I10 Essential (primary) hypertension: Secondary | ICD-10-CM | POA: Diagnosis not present

## 2023-05-13 DIAGNOSIS — D62 Acute posthemorrhagic anemia: Secondary | ICD-10-CM | POA: Diagnosis not present

## 2023-05-13 DIAGNOSIS — J9601 Acute respiratory failure with hypoxia: Secondary | ICD-10-CM | POA: Diagnosis not present

## 2023-05-14 DIAGNOSIS — E559 Vitamin D deficiency, unspecified: Secondary | ICD-10-CM | POA: Diagnosis not present

## 2023-05-14 DIAGNOSIS — R7301 Impaired fasting glucose: Secondary | ICD-10-CM | POA: Diagnosis not present

## 2023-05-14 DIAGNOSIS — S322XXD Fracture of coccyx, subsequent encounter for fracture with routine healing: Secondary | ICD-10-CM | POA: Diagnosis not present

## 2023-05-14 DIAGNOSIS — I10 Essential (primary) hypertension: Secondary | ICD-10-CM | POA: Diagnosis not present

## 2023-05-14 DIAGNOSIS — E878 Other disorders of electrolyte and fluid balance, not elsewhere classified: Secondary | ICD-10-CM | POA: Diagnosis not present

## 2023-05-14 DIAGNOSIS — J449 Chronic obstructive pulmonary disease, unspecified: Secondary | ICD-10-CM | POA: Diagnosis not present

## 2023-05-14 DIAGNOSIS — E039 Hypothyroidism, unspecified: Secondary | ICD-10-CM | POA: Diagnosis not present

## 2023-05-14 DIAGNOSIS — D72828 Other elevated white blood cell count: Secondary | ICD-10-CM | POA: Diagnosis not present

## 2023-05-14 DIAGNOSIS — G40909 Epilepsy, unspecified, not intractable, without status epilepticus: Secondary | ICD-10-CM | POA: Diagnosis not present

## 2023-05-14 DIAGNOSIS — J9601 Acute respiratory failure with hypoxia: Secondary | ICD-10-CM | POA: Diagnosis not present

## 2023-05-14 DIAGNOSIS — S32511D Fracture of superior rim of right pubis, subsequent encounter for fracture with routine healing: Secondary | ICD-10-CM | POA: Diagnosis not present

## 2023-05-14 DIAGNOSIS — D649 Anemia, unspecified: Secondary | ICD-10-CM | POA: Diagnosis not present

## 2023-05-14 DIAGNOSIS — K219 Gastro-esophageal reflux disease without esophagitis: Secondary | ICD-10-CM | POA: Diagnosis not present

## 2023-05-18 DIAGNOSIS — I10 Essential (primary) hypertension: Secondary | ICD-10-CM | POA: Diagnosis not present

## 2023-05-18 DIAGNOSIS — K219 Gastro-esophageal reflux disease without esophagitis: Secondary | ICD-10-CM | POA: Diagnosis not present

## 2023-05-18 DIAGNOSIS — J449 Chronic obstructive pulmonary disease, unspecified: Secondary | ICD-10-CM | POA: Diagnosis not present

## 2023-05-18 DIAGNOSIS — D62 Acute posthemorrhagic anemia: Secondary | ICD-10-CM | POA: Diagnosis not present

## 2023-05-18 DIAGNOSIS — S32511D Fracture of superior rim of right pubis, subsequent encounter for fracture with routine healing: Secondary | ICD-10-CM | POA: Diagnosis not present

## 2023-05-18 DIAGNOSIS — J9601 Acute respiratory failure with hypoxia: Secondary | ICD-10-CM | POA: Diagnosis not present

## 2023-05-24 DIAGNOSIS — K219 Gastro-esophageal reflux disease without esophagitis: Secondary | ICD-10-CM | POA: Diagnosis not present

## 2023-05-24 DIAGNOSIS — D62 Acute posthemorrhagic anemia: Secondary | ICD-10-CM | POA: Diagnosis not present

## 2023-05-24 DIAGNOSIS — J449 Chronic obstructive pulmonary disease, unspecified: Secondary | ICD-10-CM | POA: Diagnosis not present

## 2023-05-24 DIAGNOSIS — S32511D Fracture of superior rim of right pubis, subsequent encounter for fracture with routine healing: Secondary | ICD-10-CM | POA: Diagnosis not present

## 2023-05-24 DIAGNOSIS — J9601 Acute respiratory failure with hypoxia: Secondary | ICD-10-CM | POA: Diagnosis not present

## 2023-05-24 DIAGNOSIS — I10 Essential (primary) hypertension: Secondary | ICD-10-CM | POA: Diagnosis not present

## 2023-05-27 DIAGNOSIS — S32511D Fracture of superior rim of right pubis, subsequent encounter for fracture with routine healing: Secondary | ICD-10-CM | POA: Diagnosis not present

## 2023-05-27 DIAGNOSIS — K219 Gastro-esophageal reflux disease without esophagitis: Secondary | ICD-10-CM | POA: Diagnosis not present

## 2023-05-27 DIAGNOSIS — D62 Acute posthemorrhagic anemia: Secondary | ICD-10-CM | POA: Diagnosis not present

## 2023-05-27 DIAGNOSIS — I10 Essential (primary) hypertension: Secondary | ICD-10-CM | POA: Diagnosis not present

## 2023-05-27 DIAGNOSIS — J449 Chronic obstructive pulmonary disease, unspecified: Secondary | ICD-10-CM | POA: Diagnosis not present

## 2023-05-27 DIAGNOSIS — J9601 Acute respiratory failure with hypoxia: Secondary | ICD-10-CM | POA: Diagnosis not present

## 2023-06-01 DIAGNOSIS — D62 Acute posthemorrhagic anemia: Secondary | ICD-10-CM | POA: Diagnosis not present

## 2023-06-01 DIAGNOSIS — J9601 Acute respiratory failure with hypoxia: Secondary | ICD-10-CM | POA: Diagnosis not present

## 2023-06-01 DIAGNOSIS — K219 Gastro-esophageal reflux disease without esophagitis: Secondary | ICD-10-CM | POA: Diagnosis not present

## 2023-06-01 DIAGNOSIS — S32511D Fracture of superior rim of right pubis, subsequent encounter for fracture with routine healing: Secondary | ICD-10-CM | POA: Diagnosis not present

## 2023-06-01 DIAGNOSIS — I10 Essential (primary) hypertension: Secondary | ICD-10-CM | POA: Diagnosis not present

## 2023-06-01 DIAGNOSIS — J449 Chronic obstructive pulmonary disease, unspecified: Secondary | ICD-10-CM | POA: Diagnosis not present

## 2023-06-23 ENCOUNTER — Ambulatory Visit (INDEPENDENT_AMBULATORY_CARE_PROVIDER_SITE_OTHER): Payer: Medicare Other | Admitting: Orthopedic Surgery

## 2023-06-23 ENCOUNTER — Other Ambulatory Visit (INDEPENDENT_AMBULATORY_CARE_PROVIDER_SITE_OTHER): Payer: Medicare Other

## 2023-06-23 ENCOUNTER — Encounter: Payer: Self-pay | Admitting: Orthopedic Surgery

## 2023-06-23 VITALS — BP 138/80 | HR 106 | Ht 65.0 in | Wt 110.4 lb

## 2023-06-23 DIAGNOSIS — G8929 Other chronic pain: Secondary | ICD-10-CM | POA: Diagnosis not present

## 2023-06-23 DIAGNOSIS — M19011 Primary osteoarthritis, right shoulder: Secondary | ICD-10-CM

## 2023-06-23 DIAGNOSIS — M25511 Pain in right shoulder: Secondary | ICD-10-CM | POA: Diagnosis not present

## 2023-06-23 NOTE — Progress Notes (Signed)
New Patient Visit  Assessment: Catherine Gonzalez is a 72 y.o. female with the following: 1. Glenohumeral arthritis, right  Plan: MARCOS PELOSO has right shoulder pain, primarily in the posterior aspect of the shoulder.  She has pretty good motion overall.  Radiographs demonstrates degenerative changes in the glenohumeral joint.  She has near complete loss of joint space.  This could be contributing to her symptoms.  This also may have resulted from her recent fall.  Nonetheless, I discussed my concerns with her, and recommended a steroid injection.  This was completed in clinic today without issues.  She will follow-up as needed.  Procedure note injection - Right shoulder    Verbal consent was obtained to inject the right shoulder, subacromial space Timeout was completed to confirm the site of injection.   The skin was prepped with alcohol and ethyl chloride was sprayed at the injection site.  A 21-gauge needle was used to inject 40 mg of Depo-Medrol and 1% lidocaine (3 cc) into the subacromial space of the right shoulder using a posterolateral approach.  There were no complications.  A sterile bandage was applied.    Follow-up: Return if symptoms worsen or fail to improve.  Subjective:  Chief Complaint  Patient presents with   Shoulder Pain    R shoulder pt states she fell and broke her pelvis 3 mos ago and thinks that may have caused this pain to start. Pain is getting worse.     History of Present Illness: Catherine Gonzalez is a 72 y.o. female who presents for evaluation of right shoulder pain.  She is right-hand dominant.  She notes progressively worsening right shoulder pain, since a fall couple months ago.  She sustained fractures in her pelvis.  This has improved.  However, she continues to have pain and limitations in her right shoulder.  Pain is primarily in the posterior aspect the shoulder.  She notes occasional pains radiating distally.  She does note that her  handwriting has gotten a little bit worse.  Medications have not been effective.  No prior injections.   Review of Systems: No fevers or chills No numbness or tingling No chest pain No shortness of breath No bowel or bladder dysfunction No GI distress No headaches   Medical History:  Past Medical History:  Diagnosis Date   Allergic rhinitis    Anemia    Anxiety    Blood transfusion without reported diagnosis    Depression    Ectopic pregnancy    Finger fracture, left    index   GERD (gastroesophageal reflux disease)    Hepatitis C    Hypertension    Hypothyroidism    Seizures (HCC)     Past Surgical History:  Procedure Laterality Date   APPENDECTOMY     BRAIN SURGERY     x 3   BREAST ENHANCEMENT SURGERY     CHOLECYSTECTOMY     CLOSED REDUCTION FINGER WITH PERCUTANEOUS PINNING Left 07/25/2020   Procedure: CLOSED REDUCTION FINGER WITH PERCUTANEOUS PINNING;  Surgeon: Cindee Salt, MD;  Location: [facility];  Service: Orthopedics;  Laterality: Left;  AXILLARY BLOCK   ECTOPIC PREGNANCY SURGERY     LUMBAR SPINE SURGERY     TONSILLECTOMY AND ADENOIDECTOMY      Family History  Problem Relation Age of Onset   Heart failure Mother    Lung cancer Father    Alcohol abuse Brother    Leukemia Brother    Colon cancer Neg Hx  Social History   Tobacco Use   Smoking status: Every Day    Current packs/day: 0.25    Average packs/day: 0.3 packs/day for 40.0 years (10.0 ttl pk-yrs)    Types: Cigarettes   Smokeless tobacco: Never  Vaping Use   Vaping status: Never Used  Substance Use Topics   Alcohol use: Yes    Comment: OCC   Drug use: No    Allergies  Allergen Reactions   Codeine Nausea And Vomiting   Pegasys [Peginterferon Alfa-2a] Hives    Current Meds  Medication Sig   acetaminophen (TYLENOL) 325 MG tablet Take 2 tablets (650 mg total) by mouth every 6 (six) hours as needed for mild pain (or Fever >/= 101).   albuterol (VENTOLIN HFA) 108 (90  Base) MCG/ACT inhaler Inhale 2 puffs into the lungs every 4 (four) hours as needed for wheezing or shortness of breath.   amLODipine (NORVASC) 5 MG tablet Take 1 tablet (5 mg total) by mouth daily.   budesonide-formoterol (SYMBICORT) 160-4.5 MCG/ACT inhaler Inhale 2 puffs into the lungs 2 (two) times daily.   busPIRone (BUSPAR) 10 MG tablet Take 1 tablet (10 mg total) by mouth 2 (two) times daily.   cholecalciferol (VITAMIN D3) 25 MCG (1000 UNIT) tablet Take 1,000 Units by mouth daily.   estradiol (ESTRACE) 0.1 MG/GM vaginal cream APPLY 1 GRAM VAGINALLY 5 TIMES PER WEEK (Patient taking differently: Place 1 Applicatorful vaginally once a week. APPLY 1 GRAM VAGINALLY 5 TIMES PER WEEK)   fluticasone (FLONASE) 50 MCG/ACT nasal spray Place 2 sprays into both nostrils daily. (Patient taking differently: Place 2 sprays into both nostrils daily as needed for rhinitis or allergies.)   levETIRAcetam (KEPPRA) 750 MG tablet TAKE 1 TABLET TWICE A DAY (Patient taking differently: Take 750 mg by mouth 2 (two) times daily. Takes daily at 9:30am and 5pm)   levocetirizine (XYZAL) 5 MG tablet Take 5 mg by mouth daily.   levothyroxine (SYNTHROID) 75 MCG tablet Take 75 mcg by mouth daily before breakfast.   methocarbamol (ROBAXIN) 750 MG tablet Take 1 tablet (750 mg total) by mouth 3 (three) times daily.   olmesartan (BENICAR) 20 MG tablet Take 1 tablet (20 mg total) by mouth daily.   omeprazole (PRILOSEC) 20 MG capsule Take 20 mg by mouth daily.   oxyCODONE (OXY IR/ROXICODONE) 5 MG immediate release tablet Take 1 tablet (5 mg total) by mouth every 4 (four) hours as needed for moderate pain.   polyethylene glycol (MIRALAX) 17 g packet Take 17 g by mouth daily.   senna-docusate (SENOKOT-S) 8.6-50 MG tablet Take 2 tablets by mouth at bedtime.   sodium bicarbonate 325 MG tablet Take 1 tablet (325 mg total) by mouth 4 (four) times daily -  before meals and at bedtime.   traZODone (DESYREL) 100 MG tablet Take 1 tablet (100  mg total) by mouth at bedtime.    Objective: BP 138/80   Pulse (!) 106   Ht 5\' 5"  (1.651 m)   Wt 110 lb 6.4 oz (50.1 kg)   BMI 18.37 kg/m   Physical Exam:  General: Elderly female., Alert and oriented., and No acute distress. Gait: Ambulates with the assistance of a cane.  Evaluation of right shoulder is without deformity.  No bruising.  No redness.  She has tenderness to palpation over the posterior aspect of the shoulder, in line with the supraspinatus.  She has good range of motion, including forward flexion to 160 degrees.  Internal rotation lumbar spine.  Good strength,  mild discomfort.  Sensation intact throughout the right hand.  Negative Hoffmann.  IMAGING: I personally ordered and reviewed the following images  X-rays of the right shoulder were obtained in clinic today.  No acute injuries noted.  Loss of joint space within the glenohumeral joint.  Minimal osteophytes are appreciated.  No evidence of proximal humeral migration.  No bony lesions.  Impression: Right shoulder x-rays with moderate glenohumeral joint arthritis   New Medications:  No orders of the defined types were placed in this encounter.     Oliver Barre, MD  06/23/2023 3:55 PM

## 2023-06-23 NOTE — Patient Instructions (Signed)
Instructions Following Joint Injections  In clinic today, you received an injection in one of your joints (sometimes more than one).  Occasionally, you can have some pain at the injection site, this is normal.  You can place ice at the injection site, or take over-the-counter medications such as Tylenol (acetaminophen) or Advil (ibuprofen).  Please follow all directions listed on the bottle.  If your joint (knee or shoulder) becomes swollen, red or very painful, please contact the clinic for additional assistance.   Two medications were injected, including lidocaine and a steroid (often referred to as cortisone).  Lidocaine is effective almost immediately but wears off quickly.  However, the steroid can take a few days to improve your symptoms.  In some cases, it can make your pain worse for a couple of days.  Do not be concerned if this happens as it is common.  You can apply ice or take some over-the-counter medications as needed.   

## 2023-07-13 ENCOUNTER — Telehealth: Payer: Self-pay | Admitting: Orthopedic Surgery

## 2023-07-13 NOTE — Telephone Encounter (Signed)
Returned the patient's call and lvm for her to call us back.

## 2023-07-14 ENCOUNTER — Encounter: Payer: Self-pay | Admitting: Orthopedic Surgery

## 2023-07-14 ENCOUNTER — Ambulatory Visit (INDEPENDENT_AMBULATORY_CARE_PROVIDER_SITE_OTHER): Payer: Medicare Other | Admitting: Orthopedic Surgery

## 2023-07-14 VITALS — BP 132/79 | HR 102

## 2023-07-14 DIAGNOSIS — M898X1 Other specified disorders of bone, shoulder: Secondary | ICD-10-CM

## 2023-07-14 MED ORDER — PREDNISONE 10 MG (21) PO TBPK: ORAL_TABLET | ORAL | 0 refills | Status: DC

## 2023-07-14 NOTE — Progress Notes (Signed)
Return patient Visit  Assessment: Catherine Gonzalez is a 72 y.o. female with the following: 1. Glenohumeral arthritis, right 2.  Posterior right shoulder pain.  Plan: Catherine Gonzalez is point tender over the spine of the scapula.  This is very tender to palpation.  When I manipulate her shoulder, she has pain in this area.  She did sustain a fall, but this was several months ago.  If she has a nondisplaced fracture, I would have expected it to be healed by now.  We could obtain a CT scan, but I do not anticipate this will change overall management.  This was discussed with the patient.  She states understanding.  At this point, I would like her to try some prednisone to see if this can improve her symptoms.  Once the prednisone is complete, she can take medicines like ibuprofen as needed.  No limitations on use of her arm.  Like to see her back in 4 weeks for repeat evaluation.   Follow-up: Return in about 4 weeks (around 08/11/2023).  Subjective:  Chief Complaint  Patient presents with   Shoulder Pain    Right- has good motion but pain    History of Present Illness: Catherine Gonzalez is a 72 y.o. female who returns for evaluation of right shoulder pain.  She is right-hand dominant.  I saw her in clinic a couple weeks ago.  At that time, we completed a subacromial steroid injection.  This did not provide any relief of her symptoms.  She continues to have pain in the posterior aspect of the right shoulder.  Her motion is pretty good.  She continues to use a cane in her right hand.  Occasional radiating pains distally.   Review of Systems: No fevers or chills No numbness or tingling No chest pain No shortness of breath No bowel or bladder dysfunction No GI distress No headaches      Objective: BP 132/79   Pulse (!) 102   Physical Exam:  General: Elderly female., Alert and oriented., and No acute distress. Gait: Ambulates with the assistance of a cane.  Evaluation  of right shoulder is without deformity.  She is point tender directly over the scapular spine and the posterior and superior aspect the shoulder, approximately at the junction of the spine and the acromion.  There are no lesions in this area.  No swelling.  No bruising.  She has decent range of motion of the right shoulder, but notes pain in the swollen area.   IMAGING: I personally ordered and reviewed the following images  Imaging obtained today.  Prior x-rays were evaluated very closely.  No obvious fracture or callus formation.   New Medications:  Meds ordered this encounter  Medications   predniSONE (STERAPRED UNI-PAK 21 TAB) 10 MG (21) TBPK tablet    Sig: 10 mg DS 12 as directed    Dispense:  48 tablet    Refill:  0      Oliver Barre, MD  07/14/2023 3:26 PM

## 2023-07-23 DIAGNOSIS — K580 Irritable bowel syndrome with diarrhea: Secondary | ICD-10-CM | POA: Diagnosis not present

## 2023-07-26 ENCOUNTER — Encounter (INDEPENDENT_AMBULATORY_CARE_PROVIDER_SITE_OTHER): Payer: Self-pay | Admitting: *Deleted

## 2023-08-04 ENCOUNTER — Encounter (INDEPENDENT_AMBULATORY_CARE_PROVIDER_SITE_OTHER): Payer: Self-pay | Admitting: Gastroenterology

## 2023-08-04 ENCOUNTER — Ambulatory Visit (INDEPENDENT_AMBULATORY_CARE_PROVIDER_SITE_OTHER): Payer: Medicare Other | Admitting: Gastroenterology

## 2023-08-04 VITALS — BP 114/75 | HR 106 | Temp 97.4°F | Ht 65.0 in | Wt 108.8 lb

## 2023-08-04 DIAGNOSIS — R634 Abnormal weight loss: Secondary | ICD-10-CM | POA: Diagnosis not present

## 2023-08-04 DIAGNOSIS — K529 Noninfective gastroenteritis and colitis, unspecified: Secondary | ICD-10-CM

## 2023-08-04 DIAGNOSIS — R197 Diarrhea, unspecified: Secondary | ICD-10-CM | POA: Insufficient documentation

## 2023-08-04 DIAGNOSIS — Z8619 Personal history of other infectious and parasitic diseases: Secondary | ICD-10-CM | POA: Diagnosis not present

## 2023-08-04 DIAGNOSIS — R748 Abnormal levels of other serum enzymes: Secondary | ICD-10-CM | POA: Diagnosis not present

## 2023-08-04 DIAGNOSIS — D649 Anemia, unspecified: Secondary | ICD-10-CM | POA: Diagnosis not present

## 2023-08-04 DIAGNOSIS — D500 Iron deficiency anemia secondary to blood loss (chronic): Secondary | ICD-10-CM | POA: Insufficient documentation

## 2023-08-04 MED ORDER — METAMUCIL SMOOTH TEXTURE 58.6 % PO POWD
1.0000 | Freq: Three times a day (TID) | ORAL | 12 refills | Status: DC
Start: 2023-08-04 — End: 2023-11-08

## 2023-08-04 NOTE — Patient Instructions (Signed)
It was very nice to meet you today, as dicussed with will plan for the following :  1) Upper endoscopy and colonoscopy 2) ultrasound

## 2023-08-04 NOTE — Progress Notes (Signed)
[name] , M.D. Gastroenterology & Hepatology [facility] [address] [place], [place] [address] Primary Care Physician: Benita Stabile, MD [address] [place] [address]  Chief Complaint:  Chronic diarrhea, elevated alkaline phosphate and weight loss    History of Present Illness: Catherine Gonzalez is a 72 y.o. female with history of Hep C SVR 2016 , HTN, hypothyroidism, remote childhood history of brain tumor-with residual of left sided weakness with gait abnormality , recent hip fracture , COPD  who presents for evaluation of hronic diarrhea, elevated alkaline phosphate and weight loss  .  Patient reports that she always had liquid stools since she had cholecystectomy years ago.  Recently she reports that she is unable to keep anything down in her stomach and feels whenever she comes right through her colon.  She would have 5-6 bowel movements daily every time when she eats.  She does have occasional bloating but abdominal pain is not main of her concern.  Patient is concerned that she is losing weight as well  Patient is on olmesartan and currently on  14-day course of rifaximin.  The patient denies having any nausea, vomiting, fever, chills, hematochezia, melena, hematemesis,  jaundice, pruritus    Last EGD:2014 Normal EGD  Last Colonoscopy:2014   1 TA repeat suggested 5 years  Blood work 03/31/2023 his hemoglobin 10.8 platelet 192 Alk phos 230 AST ALT normal  FHx: neg for any gastrointestinal/liver disease, no malignancies Social: neg smoking, alcohol or illicit drug use Surgical: Cholecystectomy, appendectomy  Past Medical History: Past Medical History:  Diagnosis Date   Allergic rhinitis    Anemia    Anxiety    Blood transfusion without reported diagnosis    Depression    Ectopic pregnancy    Finger fracture, left    index   GERD (gastroesophageal reflux disease)    Hepatitis C    Hypertension     Hypothyroidism    Seizures (HCC)     Past Surgical History: Past Surgical History:  Procedure Laterality Date   APPENDECTOMY     BRAIN SURGERY     x 3   BREAST ENHANCEMENT SURGERY     CHOLECYSTECTOMY     CLOSED REDUCTION FINGER WITH PERCUTANEOUS PINNING Left 07/25/2020   Procedure: CLOSED REDUCTION FINGER WITH PERCUTANEOUS PINNING;  Surgeon: Cindee Salt, MD;  Location: [facility];  Service: Orthopedics;  Laterality: Left;  AXILLARY BLOCK   ECTOPIC PREGNANCY SURGERY     LUMBAR SPINE SURGERY     TONSILLECTOMY AND ADENOIDECTOMY      Family History: Family History  Problem Relation Age of Onset   Heart failure Mother    Lung cancer Father    Alcohol abuse Brother    Leukemia Brother    Colon cancer Neg Hx     Social History: Social History   Tobacco Use  Smoking Status Former   Current packs/day: 0.25   Average packs/day: 0.3 packs/day for 40.0 years (10.0 ttl pk-yrs)   Types: Cigarettes   Passive exposure: Past  Smokeless Tobacco Never   Social History   Substance and Sexual Activity  Alcohol Use Yes   Comment: OCC   Social History   Substance and Sexual Activity  Drug Use No    Allergies: Allergies  Allergen Reactions   Codeine Nausea And Vomiting   Pegasys [Peginterferon Alfa-2a] Hives    Medications: Current Outpatient Medications  Medication Sig Dispense Refill   acetaminophen (TYLENOL)  325 MG tablet Take 2 tablets (650 mg total) by mouth every 6 (six) hours as needed for mild pain (or Fever >/= 101). 100 tablet 2   amLODipine (NORVASC) 5 MG tablet Take 1 tablet (5 mg total) by mouth daily. 90 tablet 3   busPIRone (BUSPAR) 10 MG tablet Take 1 tablet (10 mg total) by mouth 2 (two) times daily. 60 tablet 3   cholecalciferol (VITAMIN D3) 25 MCG (1000 UNIT) tablet Take 1,000 Units by mouth daily.     estradiol (ESTRACE) 0.1 MG/GM vaginal cream APPLY 1 GRAM VAGINALLY 5 TIMES PER WEEK (Patient taking differently: Place 1 Applicatorful  vaginally once a week. APPLY 1 GRAM VAGINALLY 5 TIMES PER WEEK) 42.5 g 12   fluticasone (FLONASE) 50 MCG/ACT nasal spray Place 2 sprays into both nostrils daily. (Patient taking differently: Place 2 sprays into both nostrils daily as needed for rhinitis or allergies.) 11.1 mL 0   levETIRAcetam (KEPPRA) 750 MG tablet TAKE 1 TABLET TWICE A DAY (Patient taking differently: Take 750 mg by mouth 2 (two) times daily. Takes daily at 9:30am and 5pm) 180 tablet 3   levocetirizine (XYZAL) 5 MG tablet Take 5 mg by mouth daily.     levothyroxine (SYNTHROID) 75 MCG tablet Take 75 mcg by mouth daily before breakfast.     olmesartan (BENICAR) 20 MG tablet Take 1 tablet (20 mg total) by mouth daily. 90 tablet 5   omeprazole (PRILOSEC) 20 MG capsule Take 20 mg by mouth daily.     sodium bicarbonate 325 MG tablet Take 1 tablet (325 mg total) by mouth 4 (four) times daily -  before meals and at bedtime. 120 tablet 4   traZODone (DESYREL) 100 MG tablet Take 1 tablet (100 mg total) by mouth at bedtime. 30 tablet 5   XIFAXAN 550 MG TABS tablet Take 550 mg by mouth 3 (three) times daily.     budesonide-formoterol (SYMBICORT) 160-4.5 MCG/ACT inhaler Inhale 2 puffs into the lungs 2 (two) times daily. (Patient not taking: Reported on 08/04/2023) 10.2 g 12   No current facility-administered medications for this visit.    Review of Systems: GENERAL: negative for malaise, night sweats HEENT: No changes in hearing or vision, no nose bleeds or other nasal problems. NECK: Negative for lumps, goiter, pain and significant neck swelling RESPIRATORY: Negative for cough, wheezing CARDIOVASCULAR: Negative for chest pain, leg swelling, palpitations, orthopnea GI: SEE HPI MUSCULOSKELETAL: Negative for joint pain or swelling, back pain, and muscle pain. SKIN: Negative for lesions, rash HEMATOLOGY Negative for prolonged bleeding, bruising easily, and swollen nodes. ENDOCRINE: Negative for cold or heat intolerance, polyuria, polydipsia  and goiter. NEURO: negative for tremor, gait imbalance, syncope and seizures. The remainder of the review of systems is noncontributory.   Physical Exam: BP 114/75 (BP Location: Right Arm, Patient Position: Sitting, Cuff Size: Normal)   Pulse (!) 106   Temp (!) 97.4 F (36.3 C) (Oral)   Ht 5\' 5"  (1.651 m)   Wt 108 lb 12.8 oz (49.4 kg)   BMI 18.11 kg/m  GENERAL: The patient is AO x3, in no acute distress. HEENT: Head is normocephalic and atraumatic. EOMI are intact. Mouth is well hydrated and without lesions. NECK: Supple. No masses LUNGS: Clear to auscultation. No presence of rhonchi/wheezing/rales. Adequate chest expansion HEART: RRR, normal s1 and s2. ABDOMEN: Soft, nontender, no guarding, no peritoneal signs, and nondistended. BS +. No masses. EXTREMITIES: Without any cyanosis, clubbing, rash, lesions or edema. NEUROLOGIC: AOx3, no focal motor deficit. SKIN: no  jaundice, no rashes   Imaging/Labs: as above     Latest Ref Rng & Units 03/31/2023    3:55 AM 03/30/2023    8:08 AM 03/30/2023    4:11 AM  CBC  WBC 4.0 - 10.5 K/uL 14.0   15.0   Hemoglobin 12.0 - 15.0 g/dL 16.1  7.2  7.1   Hematocrit 36.0 - 46.0 % 32.5  22.2  22.2   Platelets 150 - 400 K/uL 192   208    Lab Results  Component Value Date   IRON 16 (L) 03/29/2023   TIBC 232 (L) 03/29/2023   FERRITIN 201.8 05/08/2013    I personally reviewed and interpreted the available labs, imaging and endoscopic files.  Impression and Plan: Catherine Gonzalez is a 72 y.o. female with history of Hep C SVR 2016 , HTN, hypothyroidism, remote childhood history of brain tumor-with residual of left sided weakness with gait abnormality , recent hip fracture , COPD  who presents for evaluation of hronic diarrhea, elevated alkaline phosphate and weight loss  .  #Chronic diarrhea  Patient has more than 3 loose stools for more than 4 weeks hence qualifies for chronic diarrhea  This could be secondary to secretory , osmotic  ,functional ,malabsorptive or inflammatory diarrhea  My differential for patient remains bile acid diarrhea, SIBO or IBS-D  Normal TSH  There is no correlation with food intake and hence patient is recommended keeping a food diary  Patient diet is low in fiber and recommended Metamucil starting 1 scoop daily for 1 week followed by 2 scoops daily second week, and 3 scoops daily thereafter, to bulk up stool  Patient obtain stool studies C. difficile and GI PCR  Patient is also due for  colonoscopy last was performed for reported suggest repeat 5 years.  Will perform colonoscopy with random colonic biopsies to rule out microscopic colitis as patient is on certain medications such as PPI which can lead to microscopic colitis  Also given severity of symptoms and anemia and patient olmesartan which can lead to celiac culture proceed with EGD with small bowel biopsies at the same time  Advised patient to complete 14-day course of rifaximin which was prescribed by PCP  #Normocytic anemia  Blood work 03/31/2023 his hemoglobin 10.8 platelet 192 14 ferritin level Will proceed with upper endoscopy and colonoscopy as above   #History of Hep C Alk phos 230 AST ALT normal  Patient has a history of hepatitis C 2015 was treated SVR 2016 documented  Will obtain right upper quadrant sono, hep C viral load, AMA and alk phos isoenzyme  All questions were answered.      [name], MD Gastroenterology and Hepatology [facility]   This chart has been completed using [facility] Dictation software, and while attempts have been made to ensure accuracy , certain words and phrases may not be transcribed as intended

## 2023-08-04 NOTE — H&P (View-Only) (Signed)
Catherine Gonzalez , M.D. Gastroenterology & Hepatology [facility] [address] [place], [place] [address] Primary Care Physician: Benita Stabile, MD [address] [place] [address]  Chief Complaint:  Chronic diarrhea, elevated alkaline phosphate and weight loss    History of Present Illness: Catherine Gonzalez is a 72 y.o. female with history of Hep C SVR 2016 , HTN, hypothyroidism, remote childhood history of brain tumor-with residual of left sided weakness with gait abnormality , recent hip fracture , COPD  who presents for evaluation of hronic diarrhea, elevated alkaline phosphate and weight loss  .  Patient reports that she always had liquid stools since she had cholecystectomy years ago.  Recently she reports that she is unable to keep anything down in her stomach and feels whenever she comes right through her colon.  She would have 5-6 bowel movements daily every time when she eats.  She does have occasional bloating but abdominal pain is not main of her concern.  Patient is concerned that she is losing weight as well  Patient is on olmesartan and currently on  14-day course of rifaximin.  The patient denies having any nausea, vomiting, fever, chills, hematochezia, melena, hematemesis,  jaundice, pruritus    Last EGD:2014 Normal EGD  Last Colonoscopy:2014   1 TA repeat suggested 5 years  Blood work 03/31/2023 his hemoglobin 10.8 platelet 192 Alk phos 230 AST ALT normal  FHx: neg for any gastrointestinal/liver disease, no malignancies Social: neg smoking, alcohol or illicit drug use Surgical: Cholecystectomy, appendectomy  Past Medical History: Past Medical History:  Diagnosis Date   Allergic rhinitis    Anemia    Anxiety    Blood transfusion without reported diagnosis    Depression    Ectopic pregnancy    Finger fracture, left    index   GERD (gastroesophageal reflux disease)    Hepatitis C    Hypertension     Hypothyroidism    Seizures (HCC)     Past Surgical History: Past Surgical History:  Procedure Laterality Date   APPENDECTOMY     BRAIN SURGERY     x 3   BREAST ENHANCEMENT SURGERY     CHOLECYSTECTOMY     CLOSED REDUCTION FINGER WITH PERCUTANEOUS PINNING Left 07/25/2020   Procedure: CLOSED REDUCTION FINGER WITH PERCUTANEOUS PINNING;  Surgeon: Cindee Salt, MD;  Location: [facility];  Service: Orthopedics;  Laterality: Left;  AXILLARY BLOCK   ECTOPIC PREGNANCY SURGERY     LUMBAR SPINE SURGERY     TONSILLECTOMY AND ADENOIDECTOMY      Family History: Family History  Problem Relation Age of Onset   Heart failure Mother    Lung cancer Father    Alcohol abuse Brother    Leukemia Brother    Colon cancer Neg Hx     Social History: Social History   Tobacco Use  Smoking Status Former   Current packs/day: 0.25   Average packs/day: 0.3 packs/day for 40.0 years (10.0 ttl pk-yrs)   Types: Cigarettes   Passive exposure: Past  Smokeless Tobacco Never   Social History   Substance and Sexual Activity  Alcohol Use Yes   Comment: OCC   Social History   Substance and Sexual Activity  Drug Use No    Allergies: Allergies  Allergen Reactions   Codeine Nausea And Vomiting   Pegasys [Peginterferon Alfa-2a] Hives    Medications: Current Outpatient Medications  Medication Sig Dispense Refill   acetaminophen (TYLENOL)  325 MG tablet Take 2 tablets (650 mg total) by mouth every 6 (six) hours as needed for mild pain (or Fever >/= 101). 100 tablet 2   amLODipine (NORVASC) 5 MG tablet Take 1 tablet (5 mg total) by mouth daily. 90 tablet 3   busPIRone (BUSPAR) 10 MG tablet Take 1 tablet (10 mg total) by mouth 2 (two) times daily. 60 tablet 3   cholecalciferol (VITAMIN D3) 25 MCG (1000 UNIT) tablet Take 1,000 Units by mouth daily.     estradiol (ESTRACE) 0.1 MG/GM vaginal cream APPLY 1 GRAM VAGINALLY 5 TIMES PER WEEK (Patient taking differently: Place 1 Applicatorful  vaginally once a week. APPLY 1 GRAM VAGINALLY 5 TIMES PER WEEK) 42.5 g 12   fluticasone (FLONASE) 50 MCG/ACT nasal spray Place 2 sprays into both nostrils daily. (Patient taking differently: Place 2 sprays into both nostrils daily as needed for rhinitis or allergies.) 11.1 mL 0   levETIRAcetam (KEPPRA) 750 MG tablet TAKE 1 TABLET TWICE A DAY (Patient taking differently: Take 750 mg by mouth 2 (two) times daily. Takes daily at 9:30am and 5pm) 180 tablet 3   levocetirizine (XYZAL) 5 MG tablet Take 5 mg by mouth daily.     levothyroxine (SYNTHROID) 75 MCG tablet Take 75 mcg by mouth daily before breakfast.     olmesartan (BENICAR) 20 MG tablet Take 1 tablet (20 mg total) by mouth daily. 90 tablet 5   omeprazole (PRILOSEC) 20 MG capsule Take 20 mg by mouth daily.     sodium bicarbonate 325 MG tablet Take 1 tablet (325 mg total) by mouth 4 (four) times daily -  before meals and at bedtime. 120 tablet 4   traZODone (DESYREL) 100 MG tablet Take 1 tablet (100 mg total) by mouth at bedtime. 30 tablet 5   XIFAXAN 550 MG TABS tablet Take 550 mg by mouth 3 (three) times daily.     budesonide-formoterol (SYMBICORT) 160-4.5 MCG/ACT inhaler Inhale 2 puffs into the lungs 2 (two) times daily. (Patient not taking: Reported on 08/04/2023) 10.2 g 12   No current facility-administered medications for this visit.    Review of Systems: GENERAL: negative for malaise, night sweats HEENT: No changes in hearing or vision, no nose bleeds or other nasal problems. NECK: Negative for lumps, goiter, pain and significant neck swelling RESPIRATORY: Negative for cough, wheezing CARDIOVASCULAR: Negative for chest pain, leg swelling, palpitations, orthopnea GI: SEE HPI MUSCULOSKELETAL: Negative for joint pain or swelling, back pain, and muscle pain. SKIN: Negative for lesions, rash HEMATOLOGY Negative for prolonged bleeding, bruising easily, and swollen nodes. ENDOCRINE: Negative for cold or heat intolerance, polyuria, polydipsia  and goiter. NEURO: negative for tremor, gait imbalance, syncope and seizures. The remainder of the review of systems is noncontributory.   Physical Exam: BP 114/75 (BP Location: Right Arm, Patient Position: Sitting, Cuff Size: Normal)   Pulse (!) 106   Temp (!) 97.4 F (36.3 C) (Oral)   Ht 5\' 5"  (1.651 m)   Wt 108 lb 12.8 oz (49.4 kg)   BMI 18.11 kg/m  GENERAL: The patient is AO x3, in no acute distress. HEENT: Head is normocephalic and atraumatic. EOMI are intact. Mouth is well hydrated and without lesions. NECK: Supple. No masses LUNGS: Clear to auscultation. No presence of rhonchi/wheezing/rales. Adequate chest expansion HEART: RRR, normal s1 and s2. ABDOMEN: Soft, nontender, no guarding, no peritoneal signs, and nondistended. BS +. No masses. EXTREMITIES: Without any cyanosis, clubbing, rash, lesions or edema. NEUROLOGIC: AOx3, no focal motor deficit. SKIN: no  jaundice, no rashes   Imaging/Labs: as above     Latest Ref Rng & Units 03/31/2023    3:55 AM 03/30/2023    8:08 AM 03/30/2023    4:11 AM  CBC  WBC 4.0 - 10.5 K/uL 14.0   15.0   Hemoglobin 12.0 - 15.0 g/dL 16.1  7.2  7.1   Hematocrit 36.0 - 46.0 % 32.5  22.2  22.2   Platelets 150 - 400 K/uL 192   208    Lab Results  Component Value Date   IRON 16 (L) 03/29/2023   TIBC 232 (L) 03/29/2023   FERRITIN 201.8 05/08/2013    I personally reviewed and interpreted the available labs, imaging and endoscopic files.  Impression and Plan: Catherine Gonzalez is a 72 y.o. female with history of Hep C SVR 2016 , HTN, hypothyroidism, remote childhood history of brain tumor-with residual of left sided weakness with gait abnormality , recent hip fracture , COPD  who presents for evaluation of hronic diarrhea, elevated alkaline phosphate and weight loss  .  #Chronic diarrhea  Patient has more than 3 loose stools for more than 4 weeks hence qualifies for chronic diarrhea  This could be secondary to secretory , osmotic  ,functional ,malabsorptive or inflammatory diarrhea  My differential for patient remains bile acid diarrhea, SIBO or IBS-D  Normal TSH  There is no correlation with food intake and hence patient is recommended keeping a food diary  Patient diet is low in fiber and recommended Metamucil starting 1 scoop daily for 1 week followed by 2 scoops daily second week, and 3 scoops daily thereafter, to bulk up stool  Patient obtain stool studies C. difficile and GI PCR  Patient is also due for  colonoscopy last was performed for reported suggest repeat 5 years.  Will perform colonoscopy with random colonic biopsies to rule out microscopic colitis as patient is on certain medications such as PPI which can lead to microscopic colitis  Also given severity of symptoms and anemia and patient olmesartan which can lead to celiac culture proceed with EGD with small bowel biopsies at the same time  Advised patient to complete 14-day course of rifaximin which was prescribed by PCP  #Normocytic anemia  Blood work 03/31/2023 his hemoglobin 10.8 platelet 192 14 ferritin level Will proceed with upper endoscopy and colonoscopy as above   #History of Hep C Alk phos 230 AST ALT normal  Patient has a history of hepatitis C 2015 was treated SVR 2016 documented  Will obtain right upper quadrant sono, hep C viral load, AMA and alk phos isoenzyme  All questions were answered.      Catherine Lawman, MD Gastroenterology and Hepatology [facility]   This chart has been completed using [facility] Dictation software, and while attempts have been made to ensure accuracy , certain words and phrases may not be transcribed as intended

## 2023-08-05 DIAGNOSIS — K529 Noninfective gastroenteritis and colitis, unspecified: Secondary | ICD-10-CM | POA: Diagnosis not present

## 2023-08-08 LAB — GI PROFILE, STOOL, PCR

## 2023-08-09 LAB — C DIFFICILE TOXINS A+B W/RFLX: C difficile Toxins A+B, EIA: NEGATIVE

## 2023-08-09 LAB — C DIFFICILE, CYTOTOXIN B

## 2023-08-10 ENCOUNTER — Telehealth (INDEPENDENT_AMBULATORY_CARE_PROVIDER_SITE_OTHER): Payer: Self-pay | Admitting: *Deleted

## 2023-08-10 ENCOUNTER — Emergency Department (HOSPITAL_BASED_OUTPATIENT_CLINIC_OR_DEPARTMENT_OTHER): Payer: Medicare Other

## 2023-08-10 ENCOUNTER — Other Ambulatory Visit: Payer: Self-pay

## 2023-08-10 ENCOUNTER — Emergency Department (HOSPITAL_BASED_OUTPATIENT_CLINIC_OR_DEPARTMENT_OTHER)
Admission: EM | Admit: 2023-08-10 | Discharge: 2023-08-10 | Disposition: A | Discharge status: Home / Self Care | Payer: Medicare Other | Attending: Emergency Medicine | Admitting: Emergency Medicine

## 2023-08-10 ENCOUNTER — Encounter (HOSPITAL_BASED_OUTPATIENT_CLINIC_OR_DEPARTMENT_OTHER): Payer: Self-pay | Admitting: Emergency Medicine

## 2023-08-10 DIAGNOSIS — K529 Noninfective gastroenteritis and colitis, unspecified: Secondary | ICD-10-CM

## 2023-08-10 DIAGNOSIS — E039 Hypothyroidism, unspecified: Secondary | ICD-10-CM | POA: Diagnosis not present

## 2023-08-10 DIAGNOSIS — K5750 Diverticulosis of both small and large intestine without perforation or abscess without bleeding: Secondary | ICD-10-CM | POA: Diagnosis not present

## 2023-08-10 DIAGNOSIS — R197 Diarrhea, unspecified: Secondary | ICD-10-CM | POA: Diagnosis not present

## 2023-08-10 DIAGNOSIS — R1011 Right upper quadrant pain: Secondary | ICD-10-CM | POA: Insufficient documentation

## 2023-08-10 DIAGNOSIS — Z79899 Other long term (current) drug therapy: Secondary | ICD-10-CM | POA: Insufficient documentation

## 2023-08-10 DIAGNOSIS — E876 Hypokalemia: Secondary | ICD-10-CM | POA: Diagnosis not present

## 2023-08-10 DIAGNOSIS — R1031 Right lower quadrant pain: Secondary | ICD-10-CM | POA: Insufficient documentation

## 2023-08-10 DIAGNOSIS — I10 Essential (primary) hypertension: Secondary | ICD-10-CM | POA: Diagnosis not present

## 2023-08-10 DIAGNOSIS — N281 Cyst of kidney, acquired: Secondary | ICD-10-CM | POA: Diagnosis not present

## 2023-08-10 LAB — COMPREHENSIVE METABOLIC PANEL
ALT: 8 U/L (ref 0–44)
AST: 12 U/L — ABNORMAL LOW (ref 15–41)
Albumin: 4.1 g/dL (ref 3.5–5.0)
Alkaline Phosphatase: 91 U/L (ref 38–126)
Anion gap: 13 (ref 5–15)
BUN: 22 mg/dL (ref 8–23)
CO2: 32 mmol/L (ref 22–32)
Calcium: 7.7 mg/dL — ABNORMAL LOW (ref 8.9–10.3)
Chloride: 96 mmol/L — ABNORMAL LOW (ref 98–111)
Creatinine, Ser: 0.82 mg/dL (ref 0.44–1.00)
GFR, Estimated: >60 mL/min (ref >60)
Glucose, Bld: 116 mg/dL — ABNORMAL HIGH (ref 70–99)
Potassium: 2.7 mmol/L — CL (ref 3.5–5.1)
Sodium: 141 mmol/L (ref 135–145)
Total Bilirubin: 0.3 mg/dL (ref 0.3–1.2)
Total Protein: 7 g/dL (ref 6.5–8.1)

## 2023-08-10 LAB — MAGNESIUM: Magnesium: 0.7 mg/dL — CL (ref 1.7–2.4)

## 2023-08-10 LAB — CBC WITH DIFFERENTIAL/PLATELET
Abs Immature Granulocytes: 0.03 10*3/uL (ref 0.00–0.07)
Basophils Absolute: 0 10*3/uL (ref 0.0–0.1)
Basophils Relative: 0 %
Eosinophils Absolute: 0.2 10*3/uL (ref 0.0–0.5)
Eosinophils Relative: 2 %
HCT: 40.1 % (ref 36.0–46.0)
Hemoglobin: 13.5 g/dL (ref 12.0–15.0)
Immature Granulocytes: 0 %
Lymphocytes Relative: 18 %
Lymphs Abs: 1.8 10*3/uL (ref 0.7–4.0)
MCH: 29 pg (ref 26.0–34.0)
MCHC: 33.7 g/dL (ref 30.0–36.0)
MCV: 86.2 fL (ref 80.0–100.0)
Monocytes Absolute: 1.1 10*3/uL — ABNORMAL HIGH (ref 0.1–1.0)
Monocytes Relative: 11 %
Neutro Abs: 7 10*3/uL (ref 1.7–7.7)
Neutrophils Relative %: 69 %
Platelets: 526 10*3/uL — ABNORMAL HIGH (ref 150–400)
RBC: 4.65 MIL/uL (ref 3.87–5.11)
RDW: 14.7 % (ref 11.5–15.5)
WBC: 10.1 10*3/uL (ref 4.0–10.5)
nRBC: 0 % (ref 0.0–0.2)

## 2023-08-10 LAB — URINALYSIS, W/ REFLEX TO CULTURE (INFECTION SUSPECTED)
Bacteria, UA: NONE SEEN
Bilirubin Urine: NEGATIVE
Glucose, UA: NEGATIVE mg/dL
Hgb urine dipstick: NEGATIVE
Ketones, ur: NEGATIVE mg/dL
Leukocytes,Ua: NEGATIVE
Nitrite: NEGATIVE
Specific Gravity, Urine: 1.046 — ABNORMAL HIGH (ref 1.005–1.030)
pH: 7 (ref 5.0–8.0)

## 2023-08-10 MED ORDER — LACTATED RINGERS IV BOLUS
1000.0000 mL | Freq: Once | INTRAVENOUS | Status: AC
  Administered 2023-08-10: 1000 mL via INTRAVENOUS

## 2023-08-10 MED ORDER — POTASSIUM CHLORIDE CRYS ER 20 MEQ PO TBCR
20.0000 meq | EXTENDED_RELEASE_TABLET | Freq: Two times a day (BID) | ORAL | 0 refills | Status: DC
Start: 2023-08-10 — End: 2023-08-19

## 2023-08-10 MED ORDER — ONDANSETRON 4 MG PO TBDP: 4.0000 mg | ORAL_TABLET | Freq: Three times a day (TID) | ORAL | 0 refills | Status: DC | PRN

## 2023-08-10 MED ORDER — ONDANSETRON HCL 4 MG/2ML IJ SOLN
4.0000 mg | Freq: Once | INTRAMUSCULAR | Status: AC
  Administered 2023-08-10: 4 mg via INTRAVENOUS
  Filled 2023-08-10: qty 2

## 2023-08-10 MED ORDER — POTASSIUM CHLORIDE CRYS ER 20 MEQ PO TBCR
40.0000 meq | EXTENDED_RELEASE_TABLET | Freq: Once | ORAL | Status: AC
  Administered 2023-08-10: 40 meq via ORAL
  Filled 2023-08-10: qty 2

## 2023-08-10 MED ORDER — ONDANSETRON HCL 4 MG/2ML IJ SOLN: 4.0000 mg | Freq: Once | INTRAMUSCULAR | Status: DC

## 2023-08-10 MED ORDER — IOHEXOL 9 MG/ML PO SOLN
500.0000 mL | ORAL | Status: AC
  Administered 2023-08-10: 500 mL via ORAL

## 2023-08-10 MED ORDER — IOHEXOL 300 MG/ML  SOLN
100.0000 mL | Freq: Once | INTRAMUSCULAR | Status: AC | PRN
  Administered 2023-08-10: 75 mL via INTRAVENOUS

## 2023-08-10 MED ORDER — DICYCLOMINE HCL 20 MG PO TABS: 20.0000 mg | ORAL_TABLET | Freq: Two times a day (BID) | ORAL | 0 refills | Status: DC | PRN

## 2023-08-10 MED ORDER — SODIUM CHLORIDE 0.9 % IV SOLN
750.0000 mg | Freq: Once | INTRAVENOUS | Status: DC
  Filled 2023-08-10: qty 7.5

## 2023-08-10 MED ORDER — MAGNESIUM SULFATE 2 GM/50ML IV SOLN
2.0000 g | Freq: Once | INTRAVENOUS | Status: AC
  Administered 2023-08-10: 2 g via INTRAVENOUS
  Filled 2023-08-10: qty 50

## 2023-08-10 MED ORDER — POTASSIUM CHLORIDE 10 MEQ/100ML IV SOLN
10.0000 meq | INTRAVENOUS | Status: AC
  Administered 2023-08-10 (×3): 10 meq via INTRAVENOUS
  Filled 2023-08-10 (×3): qty 100

## 2023-08-10 NOTE — ED Triage Notes (Signed)
Diarrhea x  "several weeks Weakness, reports unable to keep food down. See by GI recently- stool neg per chart notes [place] pain

## 2023-08-10 NOTE — Telephone Encounter (Signed)
Pt's cousin Marvel Plan called stating pt called her wanting to go to ED due to diarrhea getting worse and having dry heaves this morning.   I called patient at her home to get more information. Patient states she has diarrhea everytime she eats and in bathroom thoughout night with diarrhea. No fever, has abdominal pain all over abdomen. Started having dry heaves this morning, no vomiting, not able to eat or drink. Feels very weak, no dizziness. Has ultrasound appt tomorrow. Wanted to know results of stool studies.

## 2023-08-10 NOTE — Telephone Encounter (Signed)
Hi  For taking phone call  Can you please tell patient that his stool is negative for any infection and also negative for C. difficile infection.  This time I did recommend upper endoscopy and colonoscopies with biopsies which needs to be scheduled.  If patient feels very without any oral intake and would advise to go to ER at that time  . Tanya : Can you please have this patient scheduled for upper endoscopy and colonoscopy

## 2023-08-10 NOTE — ED Provider Notes (Signed)
[place] EMERGENCY DEPARTMENT AT [facility] Provider Note   CSN: [id number] Arrival date & time: 08/10/23  1330     History  Chief Complaint  Patient presents with   Diarrhea    Catherine Gonzalez is a 72 y.o. female.  Patient is a 72 year old female with a history of hepatitis, hypertension, seizures, GERD, hypothyroidism and pelvic fracture back in April who is presenting today with a 2-week history of persistent diarrhea.  Patient reports sometimes she is having more than 20 stools a day.  She denies any blood in the stool she has no fever, nausea or vomiting but is also having diffuse abdominal pain seems to be worse on the right.  Patient is followed up with her PCP and GI.  She has had stool samples done that have been negative for C. difficile or other bacterial infections.  She has been on antibiotics xifaxan and even started on steroids without any improvement in her abdominal pain.  Patient reports now she is not eating because every time she does it causes her to have more bowel movements.  When she saw GI yesterday they recommended she come to the emergency room due to can concern for dehydration.  She has a ultrasound for her liver do tomorrow.  She also reports recently she is starting to have some burning with urination but does not have much urine most of the time because she is not eating and drinking much.  The history is provided by the patient.  Diarrhea      Home Medications Prior to Admission medications   Medication Sig Start Date End Date Taking? Authorizing Provider  acetaminophen (TYLENOL) 325 MG tablet Take 2 tablets (650 mg total) by mouth every 6 (six) hours as needed for mild pain (or Fever >/= 101). 03/31/23   Shon Hale, MD  amLODipine (NORVASC) 5 MG tablet Take 1 tablet (5 mg total) by mouth daily. 03/31/23   Shon Hale, MD  budesonide-formoterol (SYMBICORT) 160-4.5 MCG/ACT inhaler Inhale 2 puffs into the lungs 2 (two) times  daily. Patient not taking: Reported on 08/04/2023 03/31/23   Shon Hale, MD  busPIRone (BUSPAR) 10 MG tablet Take 1 tablet (10 mg total) by mouth 2 (two) times daily. 03/31/23   Shon Hale, MD  cholecalciferol (VITAMIN D3) 25 MCG (1000 UNIT) tablet Take 1,000 Units by mouth daily.    [provider]  estradiol (ESTRACE) 0.1 MG/GM vaginal cream APPLY 1 GRAM VAGINALLY 5 TIMES PER WEEK Patient taking differently: Place 1 Applicatorful vaginally once a week. APPLY 1 GRAM VAGINALLY 5 TIMES PER WEEK 02/17/23   McKenzie, Mardene Celeste, MD  fluticasone [facility]) 50 MCG/ACT nasal spray Place 2 sprays into both nostrils daily. Patient taking differently: Place 2 sprays into both nostrils daily as needed for rhinitis or allergies. 05/18/20   Henderly, Britni A, PA-C  levETIRAcetam (KEPPRA) 750 MG tablet TAKE 1 TABLET TWICE A DAY Patient taking differently: Take 750 mg by mouth 2 (two) times daily. Takes daily at 9:30am and 5pm 04/11/14   Donita Brooks, MD  levocetirizine (XYZAL) 5 MG tablet Take 5 mg by mouth daily.    [provider]  levothyroxine (SYNTHROID) 75 MCG tablet Take 75 mcg by mouth daily before breakfast.    [provider]  olmesartan (BENICAR) 20 MG tablet Take 1 tablet (20 mg total) by mouth daily. 03/31/23   Shon Hale, MD  omeprazole (PRILOSEC) 20 MG capsule Take 20 mg by mouth daily. 06/25/20   [provider]  psyllium (METAMUCIL SMOOTH TEXTURE) 58.6 % powder Take 1 packet by mouth 3 (three) times daily. 08/04/23   Ahmed, Juanetta Beets, MD  sodium bicarbonate 325 MG tablet Take 1 tablet (325 mg total) by mouth 4 (four) times daily -  before meals and at bedtime. 03/31/23   Shon Hale, MD  traZODone (DESYREL) 100 MG tablet Take 1 tablet (100 mg total) by mouth at bedtime. 03/31/23   Emokpae, Courage, MD  XIFAXAN 550 MG TABS tablet Take 550 mg by mouth 3 (three) times daily. 07/29/23   [provider]      Allergies    Codeine and Pegasys  [peginterferon alfa-2a]    Review of Systems   Review of Systems  Gastrointestinal:  Positive for diarrhea.    Physical Exam Updated Vital Signs BP 129/77   Pulse 63   Temp 98.3 F (36.8 C) (Oral)   Resp 20   SpO2 97%  Physical Exam Vitals and nursing note reviewed.  Constitutional:      General: She is in acute distress.     Appearance: She is well-developed.  HENT:     Head: Normocephalic and atraumatic.     Mouth/Throat:     Mouth: Mucous membranes are dry.  Eyes:     Pupils: Pupils are equal, round, and reactive to light.  Cardiovascular:     Rate and Rhythm: Normal rate and regular rhythm.     Heart sounds: Normal heart sounds. No murmur heard.    No friction rub.  Pulmonary:     Effort: Pulmonary effort is normal.     Breath sounds: Normal breath sounds. No wheezing or rales.  Abdominal:     General: Bowel sounds are normal. There is no distension.     Palpations: Abdomen is soft.     Tenderness: There is abdominal tenderness in the right upper quadrant and right lower quadrant. There is guarding. There is no rebound.  Musculoskeletal:        General: No tenderness. Normal range of motion.     Comments: No edema  Skin:    General: Skin is warm and dry.     Findings: No rash.  Neurological:     Mental Status: She is alert and oriented to person, place, and time.     Cranial Nerves: No cranial nerve deficit.  Psychiatric:        Mood and Affect: Mood normal.        Behavior: Behavior normal.     ED Results / Procedures / Treatments   Labs (all labs ordered are listed, but only abnormal results are displayed) Labs Reviewed  CBC WITH DIFFERENTIAL/PLATELET - Abnormal; Notable for the following components:      Result Value   Platelets 526 (*)    Monocytes Absolute 1.1 (*)    All other components within normal limits  COMPREHENSIVE METABOLIC PANEL - Abnormal; Notable for the following components:   Potassium 2.7 (*)    Chloride 96 (*)    Glucose, Bld  116 (*)    Calcium 7.7 (*)    AST 12 (*)    All other components within normal limits  MAGNESIUM - Abnormal; Notable for the following components:   Magnesium 0.7 (*)    All other components within normal limits  URINALYSIS, W/ REFLEX TO CULTURE (INFECTION SUSPECTED)    EKG None  Radiology No results found.  Procedures Procedures    Medications Ordered in ED Medications  potassium chloride 10 mEq in  100 mL IVPB (has no administration in time range)  magnesium sulfate IVPB 2 g 50 mL (2 g Intravenous New Bag/Given 08/10/23 1522)  lactated ringers bolus 1,000 mL (1,000 mLs Intravenous New Bag/Given 08/10/23 1418)  iohexol (OMNIPAQUE) 9 MG/ML oral solution 500 mL (500 mLs Oral Contrast Given 08/10/23 1434)  ondansetron (ZOFRAN) injection 4 mg (4 mg Intravenous Given 08/10/23 1520)  iohexol (OMNIPAQUE) 300 MG/ML solution 100 mL (75 mLs Intravenous Contrast Given 08/10/23 1542)    ED Course/ Medical Decision Making/ A&P                                 Medical Decision Making Amount and/or Complexity of Data Reviewed Labs: ordered. Radiology: ordered.  Risk Prescription drug management.   Pt with multiple medical problems and comorbidities and presenting today with a complaint that caries a high risk for morbidity and mortality.  Here today with persistent diarrhea.  This is now been present for 2 weeks patient reports weight loss, abdominal pain.  She has followed up with her PCP and GI and stool samples have been negative.  She was sent here today due to dehydration.  Patient is tachycardic upon arrival here which does improve with rest and for IV fluids.  Patient does have significant right sided abdominal pain.  GI is planning on scheduling an endoscopy and colonoscopy but will do a CT to rule out any signs of colitis, perforation or abscesses.Patient has been trying medication at home for the diarrhea and has been unhelpful including Imodium, antibiotics and steroids.  Prior to  this patient reports she never had issues with diarrhea.  Known C. difficile negative.  3:46 PM Independently interpreted patient's labs and CBC within normal limits, CMP with hypokalemia today with a potassium of 2.7 chloride of 96, stable creatinine, normal anion gap and hypomagnesemia with a magnesium of 0.7.  Patient replaced IV, fluids continued.  Patient CT is pending.  Dr. Dalene Seltzer accepted care of the patient.         Final Clinical Impression(s) / ED Diagnoses Final diagnoses:  None    Rx / DC Orders ED Discharge Orders     None         Gwyneth Sprout, MD 08/10/23 1547

## 2023-08-10 NOTE — Telephone Encounter (Signed)
Will get patient scheduled when October schedule is out

## 2023-08-10 NOTE — Telephone Encounter (Signed)
Discussed with patient per DR. Ahmed -     Can you please tell patient that his stool is negative for any infection and also negative for C. difficile infection.   This time I did recommend upper endoscopy and colonoscopies with biopsies which needs to be scheduled.   If patient feels very without any oral intake and would advise to go to ER at that time           Patient verbalized understanding and states she will go to [facility] ED.

## 2023-08-10 NOTE — ED Provider Notes (Signed)
  Physical Exam  BP 129/77   Pulse 63   Temp 98.3 F (36.8 C) (Oral)   Resp 20   SpO2 97%   Physical Exam  Procedures  Procedures  ED Course / MDM    Medical Decision Making Amount and/or Complexity of Data Reviewed Labs: ordered. Radiology: ordered.  Risk Prescription drug management.   Received care of pt from Dr. Anitra Lauth please see her note for hx, physical and care. 2 weeks of severe diarrhea, 20/day, weight loss, saw PCP and GI, negative stool studies and CDiff, put on rifaxamin. No recent antibiotics prior to diarrhea, was placed on abx and steroids after. No vomiting. Does have abdominal pain.  Labs significant for hypokalemia and hypomagnesemia.  Having replacement and CT pending.   CT with rectosigmoid colitis. Is completing abx now and do not feel extension of duration indicated.    Completed IV k and mg. Ordered po k. Tolerating po.    Discussed recommendation for repeat labs, close outpatient follow up, strict return precautions. Patient discharged in stable condition with understanding of reasons to return.        Alvira Monday, MD 08/11/23 1150

## 2023-08-11 ENCOUNTER — Telehealth (INDEPENDENT_AMBULATORY_CARE_PROVIDER_SITE_OTHER): Payer: Self-pay | Admitting: *Deleted

## 2023-08-11 ENCOUNTER — Ambulatory Visit: Payer: Medicare Other | Admitting: Orthopedic Surgery

## 2023-08-11 ENCOUNTER — Ambulatory Visit (HOSPITAL_COMMUNITY)
Admission: RE | Admit: 2023-08-11 | Discharge: 2023-08-11 | Disposition: A | Discharge status: Home / Self Care | Payer: Medicare Other | Source: Ambulatory Visit | Attending: Gastroenterology | Admitting: Gastroenterology

## 2023-08-11 MED ORDER — CLENPIQ 10-3.5-12 MG-GM -GM/175ML PO SOLN: 1.0000 | ORAL | 0 refills | Status: DC

## 2023-08-11 NOTE — Telephone Encounter (Signed)
Spoke with patient cousin regarding the patient . She is better today after electrolyte supplementation .   Tanya :can you have this patient scheduled for next Thursday ?

## 2023-08-11 NOTE — Telephone Encounter (Signed)
Patient went to ED yesterday. Had ct scan. Cousin states was told she has colitis. States she had diarrhea on the way home from ED in the car. Patient Wants to get tcs and egd as soon as possible. ( October schedule is not out yet and sept full)   [phone]

## 2023-08-11 NOTE — Telephone Encounter (Signed)
Pt cousin Joyce Gross contacted and pt has been scheduled for 08/19/23 at 1 pm. Pt cousin will pick up instructions. Clenpiq sent to pharmacy. Will put pre op appt on instructions once I receive pre op date.

## 2023-08-17 DIAGNOSIS — Z79899 Other long term (current) drug therapy: Secondary | ICD-10-CM | POA: Diagnosis not present

## 2023-08-17 DIAGNOSIS — I700 Atherosclerosis of aorta: Secondary | ICD-10-CM | POA: Diagnosis not present

## 2023-08-17 DIAGNOSIS — K529 Noninfective gastroenteritis and colitis, unspecified: Secondary | ICD-10-CM | POA: Diagnosis not present

## 2023-08-17 DIAGNOSIS — F17200 Nicotine dependence, unspecified, uncomplicated: Secondary | ICD-10-CM | POA: Diagnosis not present

## 2023-08-17 DIAGNOSIS — Z713 Dietary counseling and surveillance: Secondary | ICD-10-CM | POA: Diagnosis not present

## 2023-08-17 DIAGNOSIS — G4700 Insomnia, unspecified: Secondary | ICD-10-CM | POA: Diagnosis not present

## 2023-08-17 DIAGNOSIS — Z681 Body mass index (BMI) 19 or less, adult: Secondary | ICD-10-CM | POA: Diagnosis not present

## 2023-08-17 DIAGNOSIS — I10 Essential (primary) hypertension: Secondary | ICD-10-CM | POA: Diagnosis not present

## 2023-08-17 DIAGNOSIS — E876 Hypokalemia: Secondary | ICD-10-CM | POA: Diagnosis not present

## 2023-08-17 NOTE — Patient Instructions (Addendum)
Catherine Gonzalez  08/17/2023     @PREFPERIOPPHARMACY @   Your procedure is scheduled on 08/19/2023.  Report to [facility] at 10:45 A.M.  Call this number if you have problems the morning of surgery:  [phone]  If you experience any cold or flu symptoms such as cough, fever, chills, shortness of breath, etc. between now and your scheduled surgery, please notify us at the above number.   Remember: Please Follow the Diet and Prep Instructions given to you by Dr Marcelino Freestone office         Take these medicines the morning of surgery with A SIP OF WATER : Amlodipine Buspar Bentyl Flonase Keppra Synthroid Prilosec    Do not wear jewelry, make-up or nail polish, including gel polish,  artificial nails, or any other type of covering on natural nails (fingers and  toes).  Do not wear lotions, powders, or perfumes, or deodorant.  Do not shave 48 hours prior to surgery.  Men may shave face and neck.  Do not bring valuables to the hospital.  [facility] is not responsible for any belongings or valuables.  Contacts, dentures or bridgework may not be worn into surgery.  Leave your suitcase in the car.  After surgery it may be brought to your room.  For patients admitted to the hospital, discharge time will be determined by your treatment team.  Patients discharged the day of surgery will not be allowed to drive home.   Name and phone number of your driver:   Family Special instructions:  N/A  Please read over the following fact sheets that you were given. Care and Recovery After Surgery   Upper Endoscopy, Adult Upper endoscopy is a procedure to look inside the upper GI (gastrointestinal) tract. The upper GI tract is made up of: The esophagus. This is the part of the body that moves food from your mouth to your stomach. The stomach. The duodenum. This is the first part of your small intestine. This procedure is also called esophagogastroduodenoscopy (EGD) or gastroscopy. In this  procedure, your health care provider passes a thin, flexible tube (endoscope) through your mouth and down your esophagus into your stomach and into your duodenum. A small camera is attached to the end of the tube. Images from the camera appear on a monitor in the exam room. During this procedure, your health care provider may also remove a small piece of tissue to be sent to a lab and examined under a microscope (biopsy). Your health care provider may do an upper endoscopy to diagnose cancers of the upper GI tract. You may also have this procedure to find the cause of other conditions, such as: Stomach pain. Heartburn. Pain or problems when swallowing. Nausea and vomiting. Stomach bleeding. Stomach ulcers. Tell a health care provider about: Any allergies you have. All medicines you are taking, including vitamins, herbs, eye drops, creams, and over-the-counter medicines. Any problems you or family members have had with anesthetic medicines. Any bleeding problems you have. Any surgeries you have had. Any medical conditions you have. Whether you are pregnant or may be pregnant. What are the risks? Your healthcare provider will talk with you about risks. These may include: Infection. Bleeding. Allergic reactions to medicines. A tear or hole (perforation) in the esophagus, stomach, or duodenum. What happens before the procedure? When to stop eating and drinking Follow instructions from your health care provider about what you may eat and drink. These may include: 8 hours before your procedure Stop  eating most foods. Do not eat meat, fried foods, or fatty foods. Eat only light foods, such as toast or crackers. All liquids are okay except energy drinks and alcohol. 6 hours before your procedure Stop eating. Drink only clear liquids, such as water, clear fruit juice, black coffee, plain tea, and sports drinks. Do not drink energy drinks or alcohol. 2 hours before your procedure Stop  drinking all liquids. You may be allowed to take medicines with small sips of water. If you do not follow your health care provider's instructions, your procedure may be delayed or canceled. Medicines Ask your health care provider about: Changing or stopping your regular medicines. This is especially important if you are taking diabetes medicines or blood thinners. Taking medicines such as aspirin and ibuprofen. These medicines can thin your blood. Do not take these medicines unless your health care provider tells you to take them. Taking over-the-counter medicines, vitamins, herbs, and supplements. General instructions If you will be going home right after the procedure, plan to have a responsible adult: Take you home from the hospital or clinic. You will not be allowed to drive. Care for you for the time you are told. What happens during the procedure?  An IV will be inserted into one of your veins. You may be given one or more of the following: A medicine to help you relax (sedative). A medicine to numb the throat (local anesthetic). You will lie on your left side on an exam table. Your health care provider will pass the endoscope through your mouth and down your esophagus. Your health care provider will use the scope to check the inside of your esophagus, stomach, and duodenum. Biopsies may be taken. The endoscope will be removed. The procedure may vary among health care providers and hospitals. What happens after the procedure? Your blood pressure, heart rate, breathing rate, and blood oxygen level will be monitored until you leave the hospital or clinic. When your throat is no longer numb, you may be given some fluids to drink. If you were given a sedative during the procedure, it can affect you for several hours. Do not drive or operate machinery until your health care provider says that it is safe. It is up to you to get the results of your procedure. Ask your health care provider,  or the department that is doing the procedure, when your results will be ready. Contact a health care provider if you: Have a sore throat that lasts longer than 1 day. Have a fever. Get help right away if you: Vomit blood or your vomit looks like coffee grounds. Have bloody, black, or tarry stools. Have a very bad sore throat or you cannot swallow. Have difficulty breathing or very bad pain in your chest or abdomen. These symptoms may be an emergency. Get help right away. Call 911. Do not wait to see if the symptoms will go away. Do not drive yourself to the hospital. Summary Upper endoscopy is a procedure to look inside the upper GI tract. During the procedure, an IV will be inserted into one of your veins. You may be given a medicine to help you relax. The endoscope will be passed through your mouth and down your esophagus. Follow instructions from your health care provider about what you can eat and drink. This information is not intended to replace advice given to you by your health care provider. Make sure you discuss any questions you have with your health care provider. Document Revised: 02/25/2022 Document  Reviewed: 02/25/2022 Elsevier Patient Education  2024 [employer]  Colonoscopy, Adult A colonoscopy is a procedure to look at the entire large intestine. This procedure is done using a long, thin, flexible tube that has a camera on the end. You may have a colonoscopy: As a part of normal colorectal screening. If you have certain symptoms, such as: A low number of red blood cells in your blood (anemia). Diarrhea that does not go away. Pain in your abdomen. Blood in your stool. A colonoscopy can help screen for and diagnose medical problems, including: An abnormal growth of cells or tissue (tumor). Abnormal growths within the lining of your intestine (polyps). Inflammation. Areas of bleeding. Tell your health care provider about: Any allergies you have. All medicines  you are taking, including vitamins, herbs, eye drops, creams, and over-the-counter medicines. Any problems you or family members have had with anesthetic medicines. Any bleeding problems you have. Any surgeries you have had. Any medical conditions you have. Any problems you have had with having bowel movements. Whether you are pregnant or may be pregnant. What are the risks? Generally, this is a safe procedure. However, problems may occur, including: Bleeding. Damage to your intestine. Allergic reactions to medicines given during the procedure. Infection. This is rare. What happens before the procedure? Eating and drinking restrictions Follow instructions from your health care provider about eating or drinking restrictions, which may include: A few days before the procedure: Follow a low-fiber diet. Avoid nuts, seeds, dried fruit, raw fruits, and vegetables. 1-3 days before the procedure: Eat only gelatin dessert or ice pops. Drink only clear liquids, such as water, clear juice, clear broth or bouillon, black coffee or tea, or clear soft drinks or sports drinks. Avoid liquids that contain red or purple dye. The day of the procedure: Do not eat solid foods. You may continue to drink clear liquids until up to 2 hours before the procedure. Do not eat or drink anything starting 2 hours before the procedure, or within the time period that your health care provider recommends. Bowel prep If you were prescribed a bowel prep to take by mouth (orally) to clean out your colon: Take it as told by your health care provider. Starting the day before your procedure, you will need to drink a large amount of liquid medicine. The liquid will cause you to have many bowel movements of loose stool until your stool becomes almost clear or light green. If your skin or the opening between the buttocks (anus) gets irritated from diarrhea, you may relieve the irritation using: Wipes with medicine in them, such  as adult wet wipes with aloe and vitamin E. A product to soothe skin, such as petroleum jelly. If you vomit while drinking the bowel prep: Take a break for up to 60 minutes. Begin the bowel prep again. Call your health care provider if you keep vomiting or you cannot take the bowel prep without vomiting. To clean out your colon, you may also be given: Laxative medicines. These help you have a bowel movement. Instructions for enema use. An enema is liquid medicine injected into your rectum. Medicines Ask your health care provider about: Changing or stopping your regular medicines or supplements. This is especially important if you are taking iron supplements, diabetes medicines, or blood thinners. Taking medicines such as aspirin and ibuprofen. These medicines can thin your blood. Do not take these medicines unless your health care provider tells you to take them. Taking over-the-counter medicines, vitamins, herbs, and supplements.  General instructions Ask your health care provider what steps will be taken to help prevent infection. These may include washing skin with a germ-killing soap. If you will be going home right after the procedure, plan to have a responsible adult: Take you home from the hospital or clinic. You will not be allowed to drive. Care for you for the time you are told. What happens during the procedure?  An IV will be inserted into one of your veins. You will be given a medicine to make you fall asleep (general anesthetic). You will lie on your side with your knees bent. A lubricant will be put on the tube. Then the tube will be: Inserted into your anus. Gently eased through all parts of your large intestine. Air will be sent into your colon to keep it open. This may cause some pressure or cramping. Images will be taken with the camera and will appear on a screen. A small tissue sample may be removed to be looked at under a microscope (biopsy). The tissue may be sent  to a lab for testing if any signs of problems are found. If small polyps are found, they may be removed and checked for cancer cells. When the procedure is finished, the tube will be removed. The procedure may vary among health care providers and hospitals. What happens after the procedure? Your blood pressure, heart rate, breathing rate, and blood oxygen level will be monitored until you leave the hospital or clinic. You may have a small amount of blood in your stool. You may pass gas and have mild cramping or bloating in your abdomen. This is caused by the air that was used to open your colon during the exam. If you were given a sedative during the procedure, it can affect you for several hours. Do not drive or operate machinery until your health care provider says that it is safe. It is up to you to get the results of your procedure. Ask your health care provider, or the department that is doing the procedure, when your results will be ready. Summary A colonoscopy is a procedure to look at the entire large intestine. Follow instructions from your health care provider about eating and drinking before the procedure. If you were prescribed an oral bowel prep to clean out your colon, take it as told by your health care provider. During the colonoscopy, a flexible tube with a camera on its end is inserted into the anus and then passed into all parts of the large intestine. This information is not intended to replace advice given to you by your health care provider. Make sure you discuss any questions you have with your health care provider. Document Revised: 12/29/2022 Document Reviewed: 07/09/2021 Elsevier Patient Education  2024 [employer]  Monitored Anesthesia Care Anesthesia refers to the techniques, procedures, and medicines that help a person stay safe and comfortable during surgery. Monitored anesthesia care, or sedation, is one type of anesthesia. You may have sedation if you do not  need to be asleep for your procedure. Procedures that use sedation may include: Surgery to remove cataracts from your eyes. A dental procedure. A biopsy. This is when a tissue sample is removed and looked at under a microscope. You will be watched closely during your procedure. Your level of sedation or type of anesthesia may be changed to fit your needs. Tell a health care provider about: Any allergies you have. All medicines you are taking, including vitamins, herbs, eye drops, creams, and  over-the-counter medicines. Any problems you or family members have had with anesthesia. Any bleeding problems you have. Any surgeries you have had. Any medical conditions or illnesses you have. This includes sleep apnea, cough, fever, or the flu. Whether you are pregnant or may be pregnant. Whether you use cigarettes, alcohol, or drugs. Any use of steroids, whether by mouth or as a cream. What are the risks? Your health care provider will talk with you about risks. These may include: Getting too much medicine (oversedation). Nausea. Allergic reactions to medicines. Trouble breathing. If this happens, a breathing tube may be used to help you breathe. It will be removed when you are awake and breathing on your own. Heart trouble. Lung trouble. Confusion that gets better with time (emergence delirium). What happens before the procedure? When to stop eating and drinking Follow instructions from your health care provider about what you may eat and drink. These may include: 8 hours before your procedure Stop eating most foods. Do not eat meat, fried foods, or fatty foods. Eat only light foods, such as toast or crackers. All liquids are okay except energy drinks and alcohol. 6 hours before your procedure Stop eating. Drink only clear liquids, such as water, clear fruit juice, black coffee, plain tea, and sports drinks. Do not drink energy drinks or alcohol. 2 hours before your procedure Stop  drinking all liquids. You may be allowed to take medicines with small sips of water. If you do not follow your health care provider's instructions, your procedure may be delayed or canceled. Medicines Ask your health care provider about: Changing or stopping your regular medicines. These include any diabetes medicines or blood thinners you take. Taking medicines such as aspirin and ibuprofen. These medicines can thin your blood. Do not take them unless your health care provider tells you to. Taking over-the-counter medicines, vitamins, herbs, and supplements. Testing You may have an exam or testing. You may have a blood or urine sample taken. General instructions Do not use any products that contain nicotine or tobacco for at least 4 weeks before the procedure. These products include cigarettes, chewing tobacco, and vaping devices, such as e-cigarettes. If you need help quitting, ask your health care provider. If you will be going home right after the procedure, plan to have a responsible adult: Take you home from the hospital or clinic. You will not be allowed to drive. Care for you for the time you are told. What happens during the procedure?  Your blood pressure, heart rate, breathing, level of pain, and blood oxygen level will be monitored. An IV will be inserted into one of your veins. You may be given: A sedative. This helps you relax. Anesthesia. This will: Numb certain areas of your body. Make you fall asleep for surgery. You will be given medicines as needed to keep you comfortable. The more medicine you are given, the deeper your level of sedation will be. Your level of sedation may be changed to fit your needs. There are three levels of sedation: Mild sedation. At this level, you may feel awake and relaxed. You will be able to follow directions. Moderate sedation. At this level, you will be sleepy. You may not remember the procedure. Deep sedation. At this level, you will be  asleep. You will not remember the procedure. How you get the medicines will depend on your age and the procedure. They may be given as: A pill. This may be taken by mouth (orally) or inserted into the rectum. An  injection. This may be into a vein or muscle. A spray through the nose. After your procedure is over, the medicine will be stopped. The procedure may vary among health care providers and hospitals. What happens after the procedure? Your blood pressure, heart rate, breathing rate, and blood oxygen level will be monitored until you leave the hospital or clinic. You may feel sleepy, clumsy, or nauseous. You may not remember what happened during or after the procedure. Sedation can affect you for several hours. Do not drive or use machinery until your health care provider says that it is safe. This information is not intended to replace advice given to you by your health care provider. Make sure you discuss any questions you have with your health care provider. Document Revised: 04/13/2022 Document Reviewed: 04/13/2022 Elsevier Patient Education  2024 [employer].

## 2023-08-18 ENCOUNTER — Ambulatory Visit: Payer: Medicare Other | Admitting: Urology

## 2023-08-18 ENCOUNTER — Encounter (HOSPITAL_COMMUNITY)
Admission: RE | Admit: 2023-08-18 | Discharge: 2023-08-18 | Disposition: A | Discharge status: Home / Self Care | Payer: Medicare Other | Source: Ambulatory Visit | Attending: Gastroenterology | Admitting: Gastroenterology

## 2023-08-18 ENCOUNTER — Other Ambulatory Visit (HOSPITAL_COMMUNITY): Payer: Self-pay | Admitting: Gastroenterology

## 2023-08-18 ENCOUNTER — Telehealth (INDEPENDENT_AMBULATORY_CARE_PROVIDER_SITE_OTHER): Payer: Self-pay

## 2023-08-18 ENCOUNTER — Encounter (HOSPITAL_COMMUNITY): Payer: Self-pay

## 2023-08-18 VITALS — BP 148/88 | HR 100 | Temp 98.3°F | Resp 18 | Ht 65.0 in | Wt 108.8 lb

## 2023-08-18 DIAGNOSIS — K759 Inflammatory liver disease, unspecified: Secondary | ICD-10-CM | POA: Diagnosis not present

## 2023-08-18 DIAGNOSIS — D500 Iron deficiency anemia secondary to blood loss (chronic): Secondary | ICD-10-CM | POA: Diagnosis not present

## 2023-08-18 DIAGNOSIS — R197 Diarrhea, unspecified: Secondary | ICD-10-CM | POA: Insufficient documentation

## 2023-08-18 DIAGNOSIS — I10 Essential (primary) hypertension: Secondary | ICD-10-CM

## 2023-08-18 DIAGNOSIS — Z01812 Encounter for preprocedural laboratory examination: Secondary | ICD-10-CM | POA: Insufficient documentation

## 2023-08-18 DIAGNOSIS — E876 Hypokalemia: Secondary | ICD-10-CM | POA: Diagnosis not present

## 2023-08-18 DIAGNOSIS — Z01818 Encounter for other preprocedural examination: Secondary | ICD-10-CM | POA: Diagnosis present

## 2023-08-18 LAB — CBC WITH DIFFERENTIAL/PLATELET
Abs Immature Granulocytes: 0.1 10*3/uL — ABNORMAL HIGH (ref 0.00–0.07)
Basophils Absolute: 0.1 10*3/uL (ref 0.0–0.1)
Basophils Relative: 1 %
Eosinophils Absolute: 0.1 10*3/uL (ref 0.0–0.5)
Eosinophils Relative: 1 %
HCT: 39.2 % (ref 36.0–46.0)
Hemoglobin: 12.7 g/dL (ref 12.0–15.0)
Immature Granulocytes: 1 %
Lymphocytes Relative: 23 %
Lymphs Abs: 2.4 10*3/uL (ref 0.7–4.0)
MCH: 28.6 pg (ref 26.0–34.0)
MCHC: 32.4 g/dL (ref 30.0–36.0)
MCV: 88.3 fL (ref 80.0–100.0)
Monocytes Absolute: 1 10*3/uL (ref 0.1–1.0)
Monocytes Relative: 10 %
Neutro Abs: 6.8 10*3/uL (ref 1.7–7.7)
Neutrophils Relative %: 64 %
Platelets: 544 10*3/uL — ABNORMAL HIGH (ref 150–400)
RBC: 4.44 MIL/uL (ref 3.87–5.11)
RDW: 15 % (ref 11.5–15.5)
WBC: 10.4 10*3/uL (ref 4.0–10.5)
nRBC: 0 % (ref 0.0–0.2)

## 2023-08-18 LAB — COMPREHENSIVE METABOLIC PANEL WITH GFR
ALT: 12 U/L (ref 0–44)
AST: 15 U/L (ref 15–41)
Albumin: 3.5 g/dL (ref 3.5–5.0)
Alkaline Phosphatase: 88 U/L (ref 38–126)
Anion gap: 13 (ref 5–15)
BUN: 18 mg/dL (ref 8–23)
CO2: 28 mmol/L (ref 22–32)
Calcium: 7.2 mg/dL — ABNORMAL LOW (ref 8.9–10.3)
Chloride: 96 mmol/L — ABNORMAL LOW (ref 98–111)
Creatinine, Ser: 0.68 mg/dL (ref 0.44–1.00)
GFR, Estimated: >60 mL/min (ref >60)
Glucose, Bld: 97 mg/dL (ref 70–99)
Potassium: 2.9 mmol/L — ABNORMAL LOW (ref 3.5–5.1)
Sodium: 137 mmol/L (ref 135–145)
Total Bilirubin: 0.4 mg/dL (ref 0.3–1.2)
Total Protein: 6.1 g/dL — ABNORMAL LOW (ref 6.5–8.1)

## 2023-08-18 MED ORDER — POTASSIUM CHLORIDE CRYS ER 20 MEQ PO TBCR
40.0000 meq | EXTENDED_RELEASE_TABLET | Freq: Three times a day (TID) | ORAL | 0 refills | Status: AC
Start: 2023-08-18 — End: 2023-08-19

## 2023-08-18 NOTE — Progress Notes (Signed)
Hi Catherine Gonzalez ,  Can you please call the patient and tell the patient the lab work shows very low potassium which was also seen 8 days ago in the ER . This is from  diarrhea most likely for which she is getting colonoscopy tomorrow . Ask patient for any severe weakness, chest pain or shortness of breath .  I have prescribed of Potassium tablets to be taken 3 times today ASAP  . Please ask patient to stay hydrated with Gatorade and fluids   Again if she has any severe weakness, chest pain or shortness of breath , come to ED   Thanks,  [name], MD Gastroenterology and Hepatology [facility]

## 2023-08-18 NOTE — Telephone Encounter (Signed)
Patient family came by the office today stating the Pharmacy needed clarification on the K+ we sent in earlier. As the were confused that the rx said Take 2 tablets (40 mEq total) by mouth 3 (three) times daily for 1 day, Dispense # 10. I called [facility] spoke with Shanda Bumps and advised that the Patient needed to take two po three times today, # 6. Shanda Bumps will fill the rx with new instructions and quantity. Patient family aware.

## 2023-08-19 ENCOUNTER — Ambulatory Visit (HOSPITAL_COMMUNITY): Payer: Medicare Other | Admitting: Certified Registered"

## 2023-08-19 ENCOUNTER — Ambulatory Visit (HOSPITAL_COMMUNITY)
Admission: RE | Admit: 2023-08-19 | Discharge: 2023-08-19 | Disposition: A | Discharge status: Home / Self Care | Payer: Medicare Other | Attending: Gastroenterology | Admitting: Gastroenterology

## 2023-08-19 ENCOUNTER — Encounter (INDEPENDENT_AMBULATORY_CARE_PROVIDER_SITE_OTHER): Payer: Self-pay | Admitting: *Deleted

## 2023-08-19 ENCOUNTER — Encounter (HOSPITAL_COMMUNITY)
Admission: RE | Disposition: A | Discharge status: Home / Self Care | Payer: Self-pay | Source: Home / Self Care | Attending: Gastroenterology

## 2023-08-19 ENCOUNTER — Encounter (HOSPITAL_COMMUNITY): Payer: Self-pay

## 2023-08-19 DIAGNOSIS — K319 Disease of stomach and duodenum, unspecified: Secondary | ICD-10-CM

## 2023-08-19 DIAGNOSIS — K648 Other hemorrhoids: Secondary | ICD-10-CM

## 2023-08-19 DIAGNOSIS — D132 Benign neoplasm of duodenum: Secondary | ICD-10-CM | POA: Diagnosis not present

## 2023-08-19 DIAGNOSIS — K3189 Other diseases of stomach and duodenum: Secondary | ICD-10-CM | POA: Insufficient documentation

## 2023-08-19 DIAGNOSIS — J449 Chronic obstructive pulmonary disease, unspecified: Secondary | ICD-10-CM

## 2023-08-19 DIAGNOSIS — K6389 Other specified diseases of intestine: Secondary | ICD-10-CM | POA: Diagnosis not present

## 2023-08-19 DIAGNOSIS — K5750 Diverticulosis of both small and large intestine without perforation or abscess without bleeding: Secondary | ICD-10-CM

## 2023-08-19 DIAGNOSIS — K219 Gastro-esophageal reflux disease without esophagitis: Secondary | ICD-10-CM | POA: Diagnosis not present

## 2023-08-19 DIAGNOSIS — R269 Unspecified abnormalities of gait and mobility: Secondary | ICD-10-CM | POA: Insufficient documentation

## 2023-08-19 DIAGNOSIS — K5710 Diverticulosis of small intestine without perforation or abscess without bleeding: Secondary | ICD-10-CM | POA: Diagnosis not present

## 2023-08-19 DIAGNOSIS — K644 Residual hemorrhoidal skin tags: Secondary | ICD-10-CM

## 2023-08-19 DIAGNOSIS — K31A0 Gastric intestinal metaplasia, unspecified: Secondary | ICD-10-CM | POA: Diagnosis not present

## 2023-08-19 DIAGNOSIS — Z8619 Personal history of other infectious and parasitic diseases: Secondary | ICD-10-CM | POA: Diagnosis not present

## 2023-08-19 DIAGNOSIS — D122 Benign neoplasm of ascending colon: Secondary | ICD-10-CM | POA: Diagnosis not present

## 2023-08-19 DIAGNOSIS — I10 Essential (primary) hypertension: Secondary | ICD-10-CM | POA: Diagnosis not present

## 2023-08-19 DIAGNOSIS — K529 Noninfective gastroenteritis and colitis, unspecified: Secondary | ICD-10-CM | POA: Diagnosis not present

## 2023-08-19 DIAGNOSIS — F418 Other specified anxiety disorders: Secondary | ICD-10-CM | POA: Insufficient documentation

## 2023-08-19 DIAGNOSIS — X58XXXD Exposure to other specified factors, subsequent encounter: Secondary | ICD-10-CM | POA: Insufficient documentation

## 2023-08-19 DIAGNOSIS — D127 Benign neoplasm of rectosigmoid junction: Secondary | ICD-10-CM | POA: Diagnosis not present

## 2023-08-19 DIAGNOSIS — D124 Benign neoplasm of descending colon: Secondary | ICD-10-CM

## 2023-08-19 DIAGNOSIS — K635 Polyp of colon: Secondary | ICD-10-CM | POA: Diagnosis not present

## 2023-08-19 DIAGNOSIS — D125 Benign neoplasm of sigmoid colon: Secondary | ICD-10-CM | POA: Diagnosis not present

## 2023-08-19 DIAGNOSIS — K5730 Diverticulosis of large intestine without perforation or abscess without bleeding: Secondary | ICD-10-CM

## 2023-08-19 DIAGNOSIS — D128 Benign neoplasm of rectum: Secondary | ICD-10-CM

## 2023-08-19 DIAGNOSIS — Z87891 Personal history of nicotine dependence: Secondary | ICD-10-CM

## 2023-08-19 DIAGNOSIS — K2970 Gastritis, unspecified, without bleeding: Secondary | ICD-10-CM

## 2023-08-19 DIAGNOSIS — R531 Weakness: Secondary | ICD-10-CM | POA: Insufficient documentation

## 2023-08-19 DIAGNOSIS — E039 Hypothyroidism, unspecified: Secondary | ICD-10-CM | POA: Diagnosis not present

## 2023-08-19 DIAGNOSIS — Z8759 Personal history of other complications of pregnancy, childbirth and the puerperium: Secondary | ICD-10-CM | POA: Diagnosis not present

## 2023-08-19 DIAGNOSIS — B1920 Unspecified viral hepatitis C without hepatic coma: Secondary | ICD-10-CM | POA: Insufficient documentation

## 2023-08-19 DIAGNOSIS — D126 Benign neoplasm of colon, unspecified: Secondary | ICD-10-CM

## 2023-08-19 DIAGNOSIS — D649 Anemia, unspecified: Secondary | ICD-10-CM | POA: Diagnosis not present

## 2023-08-19 DIAGNOSIS — D123 Benign neoplasm of transverse colon: Secondary | ICD-10-CM

## 2023-08-19 DIAGNOSIS — Z9049 Acquired absence of other specified parts of digestive tract: Secondary | ICD-10-CM | POA: Insufficient documentation

## 2023-08-19 DIAGNOSIS — Z79899 Other long term (current) drug therapy: Secondary | ICD-10-CM | POA: Insufficient documentation

## 2023-08-19 DIAGNOSIS — S72009D Fracture of unspecified part of neck of unspecified femur, subsequent encounter for closed fracture with routine healing: Secondary | ICD-10-CM | POA: Insufficient documentation

## 2023-08-19 HISTORY — PX: COLONOSCOPY WITH PROPOFOL: SHX5780

## 2023-08-19 HISTORY — PX: BIOPSY: SHX5522

## 2023-08-19 HISTORY — PX: ESOPHAGOGASTRODUODENOSCOPY (EGD) WITH PROPOFOL: SHX5813

## 2023-08-19 HISTORY — PX: POLYPECTOMY: SHX5525

## 2023-08-19 LAB — POCT I-STAT, CHEM 8
BUN: 14 mg/dL (ref 8–23)
Calcium, Ion: 0.97 mmol/L — ABNORMAL LOW (ref 1.15–1.40)
Chloride: 98 mmol/L (ref 98–111)
Creatinine, Ser: 0.8 mg/dL (ref 0.44–1.00)
Glucose, Bld: 95 mg/dL (ref 70–99)
HCT: 39 % (ref 36.0–46.0)
Hemoglobin: 13.3 g/dL (ref 12.0–15.0)
Potassium: 3.3 mmol/L — ABNORMAL LOW (ref 3.5–5.1)
Sodium: 141 mmol/L (ref 135–145)
TCO2: 31 mmol/L (ref 22–32)

## 2023-08-19 LAB — HM COLONOSCOPY

## 2023-08-19 SURGERY — ESOPHAGOGASTRODUODENOSCOPY (EGD) WITH PROPOFOL
Anesthesia: General

## 2023-08-19 MED ORDER — POTASSIUM CHLORIDE CRYS ER 20 MEQ PO TBCR: 20.0000 meq | EXTENDED_RELEASE_TABLET | Freq: Two times a day (BID) | ORAL | 0 refills | Status: DC

## 2023-08-19 MED ORDER — LACTATED RINGERS IV SOLN: INTRAVENOUS | Status: DC

## 2023-08-19 MED ORDER — LIDOCAINE HCL (CARDIAC) PF 100 MG/5ML IV SOSY
PREFILLED_SYRINGE | INTRAVENOUS | Status: DC | PRN
  Administered 2023-08-19: 50 mg via INTRAVENOUS

## 2023-08-19 MED ORDER — PROPOFOL 500 MG/50ML IV EMUL
INTRAVENOUS | Status: DC | PRN
  Administered 2023-08-19: 150 ug/kg/min via INTRAVENOUS

## 2023-08-19 MED ORDER — PROPOFOL 10 MG/ML IV BOLUS
INTRAVENOUS | Status: DC | PRN
Start: 2023-08-19 — End: 2023-08-19
  Administered 2023-08-19: 40 mg via INTRAVENOUS
  Administered 2023-08-19: 60 mg via INTRAVENOUS
  Administered 2023-08-19: 50 mg via INTRAVENOUS

## 2023-08-19 NOTE — Op Note (Signed)
[facility] Patient Name: Catherine Gonzalez Procedure Date: 08/19/2023 11:25 AM MRN: [id number] Date of Birth: [birth year] Attending MD: Sanjuan Dame , MD, [id number] CSN: [id number] Age: 73 Admit Type: Outpatient Procedure:                Colonoscopy Indications:              Chronic diarrhea Providers:                Sanjuan Dame, MD, Crystal Page, Dyann Ruddle Referring MD:              Medicines:                Monitored Anesthesia Care Complications:            No immediate complications. Estimated Blood Loss:     Estimated blood loss was minimal. Procedure:                Pre-Anesthesia Assessment:                           - Prior to the procedure, a History and Physical                            was performed, and patient medications and                            allergies were reviewed. The patient's tolerance of                            previous anesthesia was also reviewed. The risks                            and benefits of the procedure and the sedation                            options and risks were discussed with the patient.                            All questions were answered, and informed consent                            was obtained. Prior Anticoagulants: The patient has                            taken no anticoagulant or antiplatelet agents. ASA                            Grade Assessment: III - A patient with severe                            systemic disease. After reviewing the risks and                            benefits, the patient was deemed in satisfactory  condition to undergo the procedure.                           After obtaining informed consent, the colonoscope                            was passed under direct vision. Throughout the                            procedure, the patient's blood pressure, pulse, and                            oxygen saturations were monitored continuously. The                             [id number]) scope was introduced through the                            anus and advanced to the the terminal ileum. The                            colonoscopy was technically difficult and complex                            due to multiple diverticula in the colon and a                            tortuous colon. Successful completion of the                            procedure was aided by applying abdominal pressure. Scope In: 11:48:19 AM Scope Out: 12:36:39 PM Scope Withdrawal Time: 0 hours 34 minutes 29 seconds  Total Procedure Duration: 0 hours 48 minutes 20 seconds  Findings:      The perianal and digital rectal examinations were normal.      Scattered diverticula were found in the entire colon.      Five sessile polyps were found in the rectum, sigmoid colon, transverse       colon and ascending colon. The polyps were 3 to 9 mm in size. These       polyps were removed with a cold snare. Resection and retrieval were       complete.      An area of mildly congested mucosa was found in the entire colon.       Biopsies for histology were taken with a cold forceps from the right       colon and left colon for evaluation of microscopic colitis.      Non-bleeding external and internal hemorrhoids were found during       retroflexion.      The terminal ileum appeared normal. Impression:               - Diverticulosis in the entire examined colon.                           - Five 3 to 9 mm polyps in the rectum, in the  sigmoid colon, in the transverse colon and in the                            ascending colon, removed with a cold snare.                            Resected and retrieved.                           - Congested mucosa in the entire examined colon.                            Biopsied.                           - Non-bleeding external and internal hemorrhoids.                           - The examined portion of the ileum was  normal. Moderate Sedation:      Per Anesthesia Care Recommendation:           - Patient has a contact number available for                            emergencies. The signs and symptoms of potential                            delayed complications were discussed with the                            patient. Return to normal activities tomorrow.                            Written discharge instructions were provided to the                            patient.                           - Resume previous diet.                           - Continue present medications.                           - Await pathology results.                           - Repeat colonoscopy date to be determined after                            pending pathology results are reviewed for                            surveillance. Procedure Code(s):        --- Professional ---  16109, Colonoscopy, flexible; with removal of                            tumor(s), polyp(s), or other lesion(s) by snare                            technique                           45380, 59, Colonoscopy, flexible; with biopsy,                            single or multiple Diagnosis Code(s):        --- Professional ---                           K64.8, Other hemorrhoids                           D12.8, Benign neoplasm of rectum                           D12.5, Benign neoplasm of sigmoid colon                           D12.3, Benign neoplasm of transverse colon (hepatic                            flexure or splenic flexure)                           D12.2, Benign neoplasm of ascending colon                           K63.89, Other specified diseases of intestine                           K52.9, Noninfective gastroenteritis and colitis,                            unspecified                           K57.30, Diverticulosis of large intestine without                            perforation or abscess without bleeding CPT  copyright 2022 American Medical Association. All rights reserved. The codes documented in this report are preliminary and upon coder review may  be revised to meet current compliance requirements. Sanjuan Dame, MD Sanjuan Dame, MD 08/19/2023 12:46:53 PM This report has been signed electronically. Number of Addenda: 0

## 2023-08-19 NOTE — Discharge Instructions (Addendum)

## 2023-08-19 NOTE — Anesthesia Preprocedure Evaluation (Signed)
Anesthesia Evaluation  Patient identified by MRN, date of birth, ID band Patient awake    Reviewed: Allergy & Precautions, H&P , NPO status , Patient's Chart, lab work & pertinent test results, reviewed documented beta blocker date and time   Airway Mallampati: II  TM Distance: >3 FB Neck ROM: full    Dental no notable dental hx.    Pulmonary neg pulmonary ROS, COPD, former smoker   Pulmonary exam normal breath sounds clear to auscultation       Cardiovascular Exercise Tolerance: Good hypertension, negative cardio ROS  Rhythm:regular Rate:Normal     Neuro/Psych Seizures -,  PSYCHIATRIC DISORDERS Anxiety Depression    negative neurological ROS  negative psych ROS   GI/Hepatic negative GI ROS, Neg liver ROS,GERD  ,,(+) Hepatitis -  Endo/Other  negative endocrine ROSHypothyroidism    Renal/GU negative Renal ROS  negative genitourinary   Musculoskeletal   Abdominal   Peds  Hematology negative hematology ROS (+) Blood dyscrasia, anemia   Anesthesia Other Findings   Reproductive/Obstetrics negative OB ROS                             Anesthesia Physical Anesthesia Plan  ASA: 3  Anesthesia Plan: General   Post-op Pain Management:    Induction:   PONV Risk Score and Plan: Propofol infusion  Airway Management Planned:   Additional Equipment:   Intra-op Plan:   Post-operative Plan:   Informed Consent: I have reviewed the patients History and Physical, chart, labs and discussed the procedure including the risks, benefits and alternatives for the proposed anesthesia with the patient or authorized representative who has indicated his/her understanding and acceptance.     Dental Advisory Given  Plan Discussed with: CRNA  Anesthesia Plan Comments:        Anesthesia Quick Evaluation

## 2023-08-19 NOTE — Transfer of Care (Signed)
Immediate Anesthesia Transfer of Care Note  Patient: Catherine Gonzalez  Procedure(s) Performed: ESOPHAGOGASTRODUODENOSCOPY (EGD) WITH PROPOFOL COLONOSCOPY WITH PROPOFOL BIOPSY POLYPECTOMY  Patient Location: Short Stay  Anesthesia Type:General  Level of Consciousness: drowsy  Airway & Oxygen Therapy: Patient Spontanous Breathing  Post-op Assessment: Report given to RN and Post -op Vital signs reviewed and stable  Post vital signs: Reviewed and stable  Last Vitals:  Vitals Value Taken Time  BP    Temp    Pulse    Resp    SpO2      Last Pain:  Vitals:   08/19/23 1129  PainSc: 7       Patients Stated Pain Goal: 5 (08/19/23 1047)  Complications: No notable events documented.

## 2023-08-19 NOTE — Op Note (Signed)
[facility] Patient Name: Catherine Gonzalez Procedure Date: 08/19/2023 11:25 AM MRN: [id number] Date of Birth: [birth year] Attending MD: Sanjuan Dame , MD, [id number] CSN: [id number] Age: 71 Admit Type: Outpatient Procedure:                Upper GI endoscopy Indications:              Diarrhea Providers:                Sanjuan Dame, MD, Crystal Page, Dyann Ruddle Referring MD:              Medicines:                Monitored Anesthesia Care Complications:            No immediate complications. Estimated Blood Loss:     Estimated blood loss: none. Procedure:                Pre-Anesthesia Assessment:                           - Prior to the procedure, a History and Physical                            was performed, and patient medications and                            allergies were reviewed. The patient's tolerance of                            previous anesthesia was also reviewed. The risks                            and benefits of the procedure and the sedation                            options and risks were discussed with the patient.                            All questions were answered, and informed consent                            was obtained. Prior Anticoagulants: The patient has                            taken no anticoagulant or antiplatelet agents. ASA                            Grade Assessment: III - A patient with severe                            systemic disease. After reviewing the risks and                            benefits, the patient was deemed in satisfactory  condition to undergo the procedure.                           After obtaining informed consent, the endoscope was                            passed under direct vision. Throughout the                            procedure, the patient's blood pressure, pulse, and                            oxygen saturations were monitored continuously. The                             [id number]-[id number] ([id number]) scope was introduced through the                            mouth, and advanced to the second part of duodenum.                            The upper GI endoscopy was accomplished without                            difficulty. The patient tolerated the procedure                            well. Scope In: 11:35:45 AM Scope Out: 11:44:30 AM Total Procedure Duration: 0 hours 8 minutes 45 seconds  Findings:      The examined esophagus was normal.      Erythematous mucosa without bleeding was found in the entire examined       stomach. Biopsies were taken with a cold forceps for histology.      Erythematous mucosa and with no stigmata of bleeding was found in the       duodenal bulb and in the second portion of the duodenum. Biopsies were       taken with a cold forceps for histology.      A 8 mm non-bleeding diverticulum was found in the second portion of the       duodenum. Impression:               - Normal esophagus.                           - Erythematous mucosa in the stomach. Biopsied.                           - Erythematous duodenopathy. Biopsied.                           - Non-bleeding duodenal diverticulum. Moderate Sedation:      Per Anesthesia Care Recommendation:           - Patient has a contact number available for  emergencies. The signs and symptoms of potential                            delayed complications were discussed with the                            patient. Return to normal activities tomorrow.                            Written discharge instructions were provided to the                            patient.                           - Resume previous diet.                           - Continue present medications.                           - Await pathology results. Procedure Code(s):        --- Professional ---                           [id number], Esophagogastroduodenoscopy, flexible,                             transoral; with biopsy, single or multiple Diagnosis Code(s):        --- Professional ---                           K31.89, Other diseases of stomach and duodenum                           R19.7, Diarrhea, unspecified                           K57.10, Diverticulosis of small intestine without                            perforation or abscess without bleeding CPT copyright 2022 American Medical Association. All rights reserved. The codes documented in this report are preliminary and upon coder review may  be revised to meet current compliance requirements. Sanjuan Dame, MD Sanjuan Dame, MD 08/19/2023 12:43:31 PM This report has been signed electronically. Number of Addenda: 0

## 2023-08-19 NOTE — Interval H&P Note (Signed)
History and Physical Interval Note:  08/19/2023 11:19 AM  Catherine Gonzalez  has presented today for surgery, with the diagnosis of ANEMIA.  The various methods of treatment have been discussed with the patient and family. After consideration of risks, benefits and other options for treatment, the patient has consented to  Procedure(s) with comments: ESOPHAGOGASTRODUODENOSCOPY (EGD) WITH PROPOFOL (N/A) - 1:00PM;ASA 3 COLONOSCOPY WITH PROPOFOL (N/A) - 1:00PM;ASA 3 as a surgical intervention.  The patient's history has been reviewed, patient examined, no change in status, stable for surgery.  I have reviewed the patient's chart and labs.  Questions were answered to the patient's satisfaction.     Catherine Gonzalez

## 2023-08-20 LAB — SURGICAL PATHOLOGY

## 2023-08-20 NOTE — Anesthesia Postprocedure Evaluation (Signed)
Anesthesia Post Note  Patient: Catherine Gonzalez  Procedure(s) Performed: ESOPHAGOGASTRODUODENOSCOPY (EGD) WITH PROPOFOL COLONOSCOPY WITH PROPOFOL BIOPSY POLYPECTOMY  Patient location during evaluation: Phase II Anesthesia Type: General Level of consciousness: awake Pain management: pain level controlled Vital Signs Assessment: post-procedure vital signs reviewed and stable Respiratory status: spontaneous breathing and respiratory function stable Cardiovascular status: blood pressure returned to baseline and stable Postop Assessment: no headache and no apparent nausea or vomiting Anesthetic complications: no Comments: Late entry   No notable events documented.   Last Vitals:  Vitals:   08/19/23 1055 08/19/23 1240  BP: 105/68 (!) 104/57  Pulse: 94 83  Resp: 18 18  Temp: 37.3 C   SpO2: 100% 93%    Last Pain:  Vitals:   08/19/23 1240  TempSrc: Oral  PainSc: 0-No pain                 Windell Norfolk

## 2023-08-24 ENCOUNTER — Telehealth (INDEPENDENT_AMBULATORY_CARE_PROVIDER_SITE_OTHER): Payer: Self-pay | Admitting: *Deleted

## 2023-08-24 NOTE — Telephone Encounter (Signed)
Patient called and still having diarrhea would like her appt from 10/14 moved up. Dr. Tasia Catchings said on note she needs to follow up in clinic if still having diarrhea. Pt would like you to call her cousin Joyce Gross for appt since she brings her [phone].

## 2023-08-24 NOTE — Progress Notes (Signed)
Mitzie: Patient has appointment in December. Can she have appointment earlier like October ?  I reviewed the pathology results. Ann, can you send her a letter with the findings as described below please? Repeat colonoscopy in 3 years  Thanks,  [name], MD Gastroenterology and Hepatology [facility]  ---------------------------------------------------------------------------------------------  [facility] [address], [address], [place], [place] [address] Phone:  [phone]   08/24/23 [place], [place]   Dear Leane Para,  I am writing to inform you that the biopsies taken during your recent endoscopic examination showed:  Tubular Adenoma and Sessile Serrated polyp  I am writing to let you know the results of your recent colonoscopy.  You had a total of 5 polyps removed. The pathology came back as "tubular adenoma and Sessile Serrated polyp ." These findings are NOT cancer, but had the polyps remained in your colon, they could have turned into cancer.  Given these findings, it is recommended that your next colonoscopy be performed in 3 years.  No H. Pylori bacteria in stomach , or any early cancer changes to the stomach mucosa ( Intestinal metaplasia)  Normal biopsies of the colon as well ( No inflammatory bowel disease or microscopic colitis seen in the biopsies)  None of this explains your diarrhea. If you are continuing to have Diarrhea please see [place] in the clinic .  Please call us at [phone] if you have persistent problems or have questions about your condition that have not been fully answered at this time.  Sincerely,  [name], MD Gastroenterology and Hepatology  ======================  Can consider changing olmesartan to alternative by PCP Ferritin : 14 , may need Capsule in future for IDA as  EGD/Colonoscopy did not explain IDA

## 2023-08-27 ENCOUNTER — Encounter (INDEPENDENT_AMBULATORY_CARE_PROVIDER_SITE_OTHER): Payer: Self-pay | Admitting: *Deleted

## 2023-08-27 ENCOUNTER — Encounter (HOSPITAL_COMMUNITY): Payer: Self-pay | Admitting: Gastroenterology

## 2023-08-31 ENCOUNTER — Ambulatory Visit (INDEPENDENT_AMBULATORY_CARE_PROVIDER_SITE_OTHER): Payer: Medicare Other | Admitting: Gastroenterology

## 2023-08-31 ENCOUNTER — Encounter (INDEPENDENT_AMBULATORY_CARE_PROVIDER_SITE_OTHER): Payer: Self-pay | Admitting: Gastroenterology

## 2023-08-31 VITALS — BP 139/91 | HR 91 | Temp 98.4°F | Ht 65.0 in | Wt 107.1 lb

## 2023-08-31 DIAGNOSIS — E876 Hypokalemia: Secondary | ICD-10-CM

## 2023-08-31 DIAGNOSIS — K529 Noninfective gastroenteritis and colitis, unspecified: Secondary | ICD-10-CM | POA: Diagnosis not present

## 2023-08-31 DIAGNOSIS — D509 Iron deficiency anemia, unspecified: Secondary | ICD-10-CM

## 2023-08-31 DIAGNOSIS — D500 Iron deficiency anemia secondary to blood loss (chronic): Secondary | ICD-10-CM

## 2023-08-31 DIAGNOSIS — Z860101 Personal history of adenomatous and serrated colon polyps: Secondary | ICD-10-CM | POA: Diagnosis not present

## 2023-08-31 DIAGNOSIS — Z860100 Personal history of colon polyps, unspecified: Secondary | ICD-10-CM | POA: Insufficient documentation

## 2023-08-31 DIAGNOSIS — Z8619 Personal history of other infectious and parasitic diseases: Secondary | ICD-10-CM

## 2023-08-31 NOTE — Patient Instructions (Signed)
It was very nice to meet you today, as dicussed with will plan for the following :  Ensure adequate fluid intake: Aim for 8 glasses of water daily. Follow a high fiber diet: Include foods such as dates, prunes, pears, and kiwi. Use Metamucil twice a day.  Check blood work

## 2023-08-31 NOTE — Progress Notes (Signed)
Catherine Gonzalez , M.D. Gastroenterology & Hepatology [facility] [address] [place], [place] [address] Primary Care Physician: Benita Stabile, MD [address] [place] [address]  Chief Complaint:  Chronic diarrhea, IDA  History of Present Illness:  Catherine Gonzalez is a 72 y.o. female with history of Hep C SVR 2016 , HTN, hypothyroidism, remote childhood history of brain tumor-with residual of left sided weakness with gait abnormality , recent hip fracture , COPD  who presents for evaluation of chronic diarrhea and IDA  Patient reports patient reports that frequency of bowel movements are better now having 2-3 liquid bowel movements.  She has noticed certain food with make it worse such as [employer] double cheeseburger.The patient denies having any nausea, vomiting, fever, chills, hematochezia, melena, hematemesis, abdominal distention, abdominal pain, jaundice, pruritus    Patient was on olmesartan which was tsopped few weeks back and completed 14-day course of rifaximin.   Last EGD:08/2023  - Normal esophagus. - Erythematous mucosa in the stomach. Biopsied. - Erythematous duodenopathy. Biopsied.   Last Colonoscopy:08/2023 - Diverticulosis in the entire examined colon. - Five 3 to 9 mm polyps in the rectum, in the sigmoid colon, in the transverse colon and in the ascending colon, removed with a cold snare. Resected and retrieved. - Congested mucosa in the entire examined colon. Biopsied. - Non- bleeding external and internal hemorrhoids. - The examined portion of the ileum was normal.  A. DUODENUM, RANDOM, BIOPSY: - Benign small bowel mucosa with focal foveolar metaplasia, suggestive of peptic injury  B. STOMACH, RANDOM, BIOPSY: - Gastric antral and oxyntic mucosa with features of reactive gastropathy - Negative for H. pylori on HE stain - Negative for intestinal metaplasia or malignancy   C. COLON, RIGHT, RANDOM,  BIOPSY: - Benign colonic mucosa with no specific pathologic changes - Negative for increased intraepithelial lymphocytes or thickened subepithelial collagen table - Negative for dysplasia or malignancy  D. COLON, LEFT, RANDOM, BIOPSY: - Benign colonic mucosa with no specific pathologic changes - Negative for increased intraepithelial lymphocytes or thickened subepithelial collagen table - Negative for dysplasia or malignancy  E. COLON, ASCENDING, TRANSVERSE, SIGMOID, RECTAL COLON, POLYPECTOMY: - Sessile serrated polyp(s). - No dysplasia or malignancy. - Tubular adenoma. - No high grade dysplasia or malignancy. - Hyperplastic polyp. - No dysplasia or malignancy.   Repeat 3 years   Blood work 03/31/2023 his hemoglobin 10.8 platelet 192 Alk phos 230 AST ALT normal   FHx: neg for any gastrointestinal/liver disease, no malignancies Social: neg smoking, alcohol or illicit drug use Surgical: Cholecystectomy, appendectomy  Past Medical History: Past Medical History:  Diagnosis Date   Allergic rhinitis    Anemia    Anxiety    Blood transfusion without reported diagnosis    Depression    Ectopic pregnancy    Finger fracture, left    index   GERD (gastroesophageal reflux disease)    Hepatitis C    Hypertension    Hypothyroidism    Seizures (HCC)     Past Surgical History: Past Surgical History:  Procedure Laterality Date   APPENDECTOMY     BIOPSY  08/19/2023   Procedure: BIOPSY;  Surgeon: Franky Macho, MD;  [facility];  Service: Endoscopy;;   BRAIN SURGERY     x 3   BREAST ENHANCEMENT SURGERY     CHOLECYSTECTOMY     CLOSED REDUCTION FINGER WITH PERCUTANEOUS PINNING Left 07/25/2020   Procedure: CLOSED REDUCTION FINGER WITH PERCUTANEOUS  PINNING;  Surgeon: Cindee Salt, MD;  Location: [facility];  Service: Orthopedics;  Laterality: Left;  AXILLARY BLOCK   COLONOSCOPY WITH PROPOFOL N/A 08/19/2023   Procedure: COLONOSCOPY WITH PROPOFOL;  Surgeon:  Franky Macho, MD;  [facility];  Service: Endoscopy;  Laterality: N/A;  1:00PM;ASA 3   ECTOPIC PREGNANCY SURGERY     ESOPHAGOGASTRODUODENOSCOPY (EGD) WITH PROPOFOL N/A 08/19/2023   Procedure: ESOPHAGOGASTRODUODENOSCOPY (EGD) WITH PROPOFOL;  Surgeon: Franky Macho, MD;  [facility];  Service: Endoscopy;  Laterality: N/A;  1:00PM;ASA 3   LUMBAR SPINE SURGERY     POLYPECTOMY  08/19/2023   Procedure: POLYPECTOMY;  Surgeon: Franky Macho, MD;  [facility];  Service: Endoscopy;;   TONSILLECTOMY AND ADENOIDECTOMY      Family History: Family History  Problem Relation Age of Onset   Heart failure Mother    Lung cancer Father    Alcohol abuse Brother    Leukemia Brother    Colon cancer Neg Hx     Social History: Social History   Tobacco Use  Smoking Status Former   Current packs/day: 0.25   Average packs/day: 0.3 packs/day for 40.0 years (10.0 ttl pk-yrs)   Types: Cigarettes   Passive exposure: Past  Smokeless Tobacco Never   Social History   Substance and Sexual Activity  Alcohol Use Yes   Comment: OCC   Social History   Substance and Sexual Activity  Drug Use No    Allergies: Allergies  Allergen Reactions   Codeine Nausea And Vomiting   Pegasys [Peginterferon Alfa-2a] Hives    Medications: Current Outpatient Medications  Medication Sig Dispense Refill   acetaminophen (TYLENOL) 325 MG tablet Take 2 tablets (650 mg total) by mouth every 6 (six) hours as needed for mild pain (or Fever >/= 101). 100 tablet 2   amLODipine (NORVASC) 5 MG tablet Take 1 tablet (5 mg total) by mouth daily. 90 tablet 3   budesonide-formoterol (SYMBICORT) 160-4.5 MCG/ACT inhaler Inhale 2 puffs into the lungs 2 (two) times daily. 10.2 g 12   busPIRone (BUSPAR) 10 MG tablet Take 1 tablet (10 mg total) by mouth 2 (two) times daily. 60 tablet 3   cholecalciferol (VITAMIN D3) 25 MCG (1000 UNIT) tablet Take 1,000 Units by mouth daily.     dicyclomine  (BENTYL) 20 MG tablet Take 1 tablet (20 mg total) by mouth 2 (two) times daily as needed for spasms (abdominal cramping). 20 tablet 0   estradiol (ESTRACE) 0.1 MG/GM vaginal cream APPLY 1 GRAM VAGINALLY 5 TIMES PER WEEK (Patient taking differently: Place 1 Applicatorful vaginally once a week. APPLY 1 GRAM VAGINALLY 5 TIMES PER WEEK) 42.5 g 12   fluticasone (FLONASE) 50 MCG/ACT nasal spray Place 2 sprays into both nostrils daily. (Patient taking differently: Place 2 sprays into both nostrils daily as needed for rhinitis or allergies.) 11.1 mL 0   levETIRAcetam (KEPPRA) 750 MG tablet TAKE 1 TABLET TWICE A DAY (Patient taking differently: Take 750 mg by mouth 2 (two) times daily. Takes daily at 9:30am and 5pm) 180 tablet 3   levocetirizine (XYZAL) 5 MG tablet Take 5 mg by mouth daily.     levothyroxine (SYNTHROID) 75 MCG tablet Take 75 mcg by mouth daily before breakfast.     omeprazole (PRILOSEC) 20 MG capsule Take 20 mg by mouth daily.     ondansetron (ZOFRAN-ODT) 4 MG disintegrating tablet Take 1 tablet (4 mg total) by mouth every 8 (eight) hours as needed for  nausea or vomiting. 20 tablet 0   sodium bicarbonate 325 MG tablet Take 1 tablet (325 mg total) by mouth 4 (four) times daily -  before meals and at bedtime. 120 tablet 4   traZODone (DESYREL) 100 MG tablet Take 1 tablet (100 mg total) by mouth at bedtime. 30 tablet 5   psyllium (METAMUCIL SMOOTH TEXTURE) 58.6 % powder Take 1 packet by mouth 3 (three) times daily. (Patient not taking: Reported on 08/31/2023) 283 g 12   No current facility-administered medications for this visit.    Review of Systems: GENERAL: negative for malaise, night sweats HEENT: No changes in hearing or vision, no nose bleeds or other nasal problems. NECK: Negative for lumps, goiter, pain and significant neck swelling RESPIRATORY: Negative for cough, wheezing CARDIOVASCULAR: Negative for chest pain, leg swelling, palpitations, orthopnea GI: SEE HPI MUSCULOSKELETAL:  Negative for joint pain or swelling, back pain, and muscle pain. SKIN: Negative for lesions, rash HEMATOLOGY Negative for prolonged bleeding, bruising easily, and swollen nodes. ENDOCRINE: Negative for cold or heat intolerance, polyuria, polydipsia and goiter. NEURO: negative for tremor, gait imbalance, syncope and seizures. The remainder of the review of systems is noncontributory.   Physical Exam: BP (!) 139/91   Pulse 91   Temp 98.4 F (36.9 C) (Oral)   Ht 5\' 5"  (1.651 m)   Wt 107 lb 1.6 oz (48.6 kg)   BMI 17.82 kg/m  GENERAL: The patient is AO x3, in no acute distress. HEENT: Head is normocephalic and atraumatic. EOMI are intact. Mouth is well hydrated and without lesions. NECK: Supple. No masses LUNGS: Clear to auscultation. No presence of rhonchi/wheezing/rales. Adequate chest expansion HEART: RRR, normal s1 and s2. ABDOMEN: Soft, nontender, no guarding, no peritoneal signs, and nondistended. BS +. No masses.  EXTREMITIES: Without any cyanosis, clubbing, rash, lesions or edema. NEUROLOGIC: AOx3, no focal motor deficit. SKIN: no jaundice, no rashes   Imaging/Labs: as above     Latest Ref Rng & Units 08/19/2023   11:18 AM 08/18/2023   11:42 AM 08/10/2023    2:17 PM  CBC  WBC 4.0 - 10.5 K/uL  10.4  10.1   Hemoglobin 12.0 - 15.0 g/dL 40.9  81.1  91.4   Hematocrit 36.0 - 46.0 % 39.0  39.2  40.1   Platelets 150 - 400 K/uL  544  526    Lab Results  Component Value Date   IRON 16 (L) 03/29/2023   TIBC 232 (L) 03/29/2023   FERRITIN 201.8 05/08/2013    I personally reviewed and interpreted the available labs, imaging and endoscopic files.  Impression and Plan:   Catherine Gonzalez is a 72 y.o. female with history of Hep C SVR 2016 , HTN, hypothyroidism, remote childhood history of brain tumor-with residual of left sided weakness with gait abnormality , recent hip fracture , COPD  who presents for evaluation of chronic diarrhea and IDA  #Chronic diarrhea-  Improved  Patient EGD with gastric and small bowel biopsies colonoscopy with random colon biopsies and terminal ileum biopsies, negative for H. pylori and negative intestinal metaplasia and biopsies negative for inflammatory bowel disease or microscopic colitis  Normal TSH C. difficile and GI PCR negative  Patient underwent 14-day course of rifaximin  Patient now having 2-3 liquid bowel movement and has significantly improved  There is correlation with certain food intake and hence patient is recommended keeping a food diary and avoid those foods   Patient diet is low in fiber and recommended Metamucil starting 1 scoop  daily for 1 week followed by 2 scoops daily second week, and 3 scoops daily thereafter, to bulk up stool  Also recommend avoiding olmesartan for hypertension control as it can lead to diarrhea and iron deficiency (Celiac like picture)  #Iron deficiency anemia  Blood work 03/31/2023 his hemoglobin 10.8 platelet 192 14 ferritin level upper endoscopy and colonoscopy negative to explain cause of IDA  Will recheck Ferritin and iron profile, if continues to be iron deficient after adequate supplementation will obtain small bowel capsule endoscopy or IDA as EGD/Colonoscopy did not explain IDA   #History of Hep C Alk phos 230 AST ALT normal, Alk phos has now normalized  Patient has a history of hepatitis C 2015 was treated SVR 2016 documented   #colonic polyps Total 5 tubular adenoma and Sessile Serrated polyp removed 08/2023  #Hypokalemia Will check electrolytes today   Next surveillance colonoscopy in 3 years  All questions were answered.      Catherine Lawman, MD Gastroenterology and Hepatology [facility]   This chart has been completed using [facility] Dictation software, and while attempts have been made to ensure accuracy , certain words and phrases may not be transcribed as intended

## 2023-09-01 LAB — COMPREHENSIVE METABOLIC PANEL
ALT: 7 [IU]/L (ref 0–32)
AST: 17 [IU]/L (ref 0–40)
Albumin: 4.5 g/dL (ref 3.9–4.9)
Alkaline Phosphatase: 117 [IU]/L (ref 44–121)
BUN/Creatinine Ratio: 20 (ref 12–28)
BUN: 15 mg/dL (ref 8–27)
Bilirubin Total: 0.4 mg/dL (ref 0.0–1.2)
CO2: 26 mmol/L (ref 20–29)
Calcium: 9.8 mg/dL (ref 8.7–10.3)
Chloride: 97 mmol/L (ref 96–106)
Creatinine, Ser: 0.75 mg/dL (ref 0.57–1.00)
Globulin, Total: 2.3 g/dL (ref 1.5–4.5)
Glucose: 94 mg/dL (ref 70–99)
Potassium: 4.1 mmol/L (ref 3.5–5.2)
Sodium: 139 mmol/L (ref 134–144)
Total Protein: 6.8 g/dL (ref 6.0–8.5)
eGFR: 86 mL/min/{1.73_m2} (ref >59)

## 2023-09-01 LAB — IRON,TIBC AND FERRITIN PANEL
Ferritin: 960 ng/mL — ABNORMAL HIGH (ref 15–150)
Iron Saturation: 29 % (ref 15–55)
Iron: 78 ug/dL (ref 27–139)
Total Iron Binding Capacity: 268 ug/dL (ref 250–450)
UIBC: 190 ug/dL (ref 118–369)

## 2023-09-02 NOTE — Progress Notes (Signed)
Hi Catherine Gonzalez ,  Can you please call the patient and tell the patient the lab work  shows no Iron deficiency and potassium is not low anymore. She can stop taking Iron tablets if she is taking it  Ferritin is rather high 960, we will follow this up in future   Thanks,  [name], MD Gastroenterology and Hepatology [facility]

## 2023-09-13 ENCOUNTER — Ambulatory Visit (INDEPENDENT_AMBULATORY_CARE_PROVIDER_SITE_OTHER): Payer: Medicare Other | Admitting: Gastroenterology

## 2023-09-16 DIAGNOSIS — Z23 Encounter for immunization: Secondary | ICD-10-CM | POA: Diagnosis not present

## 2023-10-05 DIAGNOSIS — F17200 Nicotine dependence, unspecified, uncomplicated: Secondary | ICD-10-CM | POA: Diagnosis not present

## 2023-10-05 DIAGNOSIS — J019 Acute sinusitis, unspecified: Secondary | ICD-10-CM | POA: Diagnosis not present

## 2023-10-05 DIAGNOSIS — J302 Other seasonal allergic rhinitis: Secondary | ICD-10-CM | POA: Diagnosis not present

## 2023-10-05 DIAGNOSIS — Z20822 Contact with and (suspected) exposure to covid-19: Secondary | ICD-10-CM | POA: Diagnosis not present

## 2023-10-13 ENCOUNTER — Ambulatory Visit: Payer: Medicare Other | Admitting: Urology

## 2023-10-13 VITALS — BP 146/76 | HR 109

## 2023-10-13 DIAGNOSIS — R3129 Other microscopic hematuria: Secondary | ICD-10-CM | POA: Diagnosis not present

## 2023-10-13 DIAGNOSIS — E039 Hypothyroidism, unspecified: Secondary | ICD-10-CM | POA: Diagnosis not present

## 2023-10-13 DIAGNOSIS — E559 Vitamin D deficiency, unspecified: Secondary | ICD-10-CM | POA: Diagnosis not present

## 2023-10-13 DIAGNOSIS — I10 Essential (primary) hypertension: Secondary | ICD-10-CM | POA: Diagnosis not present

## 2023-10-13 DIAGNOSIS — N952 Postmenopausal atrophic vaginitis: Secondary | ICD-10-CM

## 2023-10-13 DIAGNOSIS — R7301 Impaired fasting glucose: Secondary | ICD-10-CM | POA: Diagnosis not present

## 2023-10-13 DIAGNOSIS — D649 Anemia, unspecified: Secondary | ICD-10-CM | POA: Diagnosis not present

## 2023-10-13 LAB — URINALYSIS, ROUTINE W REFLEX MICROSCOPIC
Bilirubin, UA: NEGATIVE
Glucose, UA: NEGATIVE
Ketones, UA: NEGATIVE
Leukocytes,UA: NEGATIVE
Nitrite, UA: NEGATIVE
Protein,UA: NEGATIVE
RBC, UA: NEGATIVE
Specific Gravity, UA: 1.015 (ref 1.005–1.030)
Urobilinogen, Ur: 0.2 mg/dL (ref 0.2–1.0)
pH, UA: 7.5 (ref 5.0–7.5)

## 2023-10-13 MED ORDER — ESTRADIOL 0.1 MG/GM VA CREA: 1.0000 | TOPICAL_CREAM | VAGINAL | 11 refills | Status: AC

## 2023-10-13 NOTE — Progress Notes (Signed)
10/13/2023 11:44 AM   Catherine Gonzalez 05/15/1951 [id number]  Referring provider: Benita Stabile, MD [address] [place],  [place] [address]  Followup hematuria and atrophic vaginitis  HPI: Catherine Gonzalez is a 72yo here for followup for microhematuria and atrophic vaginitis. No gross hematuria since last visit. UA today is normal. No issues urinating. No UTIs since last visit. She uses estrace cream 5x per week.    PMH: Past Medical History:  Diagnosis Date   Allergic rhinitis    Anemia    Anxiety    Blood transfusion without reported diagnosis    Depression    Ectopic pregnancy    Finger fracture, left    index   GERD (gastroesophageal reflux disease)    Hepatitis C    Hypertension    Hypothyroidism    Seizures (HCC)     Surgical History: Past Surgical History:  Procedure Laterality Date   APPENDECTOMY     BIOPSY  08/19/2023   Procedure: BIOPSY;  Surgeon: Franky Macho, MD;  [facility];  Service: Endoscopy;;   BRAIN SURGERY     x 3   BREAST ENHANCEMENT SURGERY     CHOLECYSTECTOMY     CLOSED REDUCTION FINGER WITH PERCUTANEOUS PINNING Left 07/25/2020   Procedure: CLOSED REDUCTION FINGER WITH PERCUTANEOUS PINNING;  Surgeon: Cindee Salt, MD;  Location: [facility];  Service: Orthopedics;  Laterality: Left;  AXILLARY BLOCK   COLONOSCOPY WITH PROPOFOL N/A 08/19/2023   Procedure: COLONOSCOPY WITH PROPOFOL;  Surgeon: Franky Macho, MD;  [facility];  Service: Endoscopy;  Laterality: N/A;  1:00PM;ASA 3   ECTOPIC PREGNANCY SURGERY     ESOPHAGOGASTRODUODENOSCOPY (EGD) WITH PROPOFOL N/A 08/19/2023   Procedure: ESOPHAGOGASTRODUODENOSCOPY (EGD) WITH PROPOFOL;  Surgeon: Franky Macho, MD;  [facility];  Service: Endoscopy;  Laterality: N/A;  1:00PM;ASA 3   LUMBAR SPINE SURGERY     POLYPECTOMY  08/19/2023   Procedure: POLYPECTOMY;  Surgeon: Franky Macho, MD;  [facility];  Service: Endoscopy;;    TONSILLECTOMY AND ADENOIDECTOMY      Home Medications:  Allergies as of 10/13/2023       Reactions   Codeine Nausea And Vomiting   Pegasys [peginterferon Alfa-2a] Hives        Medication List        Accurate as of October 13, 2023 11:44 AM. If you have any questions, ask your nurse or doctor.          acetaminophen 325 MG tablet Commonly known as: TYLENOL Take 2 tablets (650 mg total) by mouth every 6 (six) hours as needed for mild pain (or Fever >/= 101).   amLODipine 5 MG tablet Commonly known as: NORVASC Take 1 tablet (5 mg total) by mouth daily.   budesonide-formoterol 160-4.5 MCG/ACT inhaler Commonly known as: Symbicort Inhale 2 puffs into the lungs 2 (two) times daily.   busPIRone 10 MG tablet Commonly known as: BUSPAR Take 1 tablet (10 mg total) by mouth 2 (two) times daily.   cholecalciferol 25 MCG (1000 UNIT) tablet Commonly known as: VITAMIN D3 Take 1,000 Units by mouth daily.   dicyclomine 20 MG tablet Commonly known as: BENTYL Take 1 tablet (20 mg total) by mouth 2 (two) times daily as needed for spasms (abdominal cramping).   estradiol 0.1 MG/GM vaginal cream Commonly known as: ESTRACE APPLY 1 GRAM VAGINALLY 5 TIMES PER WEEK What changed:  how much to take how to take this  when to take this   fluticasone 50 MCG/ACT nasal spray Commonly known as: FLONASE Place 2 sprays into both nostrils daily. What changed:  when to take this reasons to take this   levETIRAcetam 750 MG tablet Commonly known as: KEPPRA TAKE 1 TABLET TWICE A DAY What changed:  how much to take how to take this when to take this additional instructions   levocetirizine 5 MG tablet Commonly known as: XYZAL Take 5 mg by mouth daily.   levothyroxine 75 MCG tablet Commonly known as: SYNTHROID Take 75 mcg by mouth daily before breakfast.   Metamucil Smooth Texture 58.6 % powder Generic drug: psyllium Take 1 packet by mouth 3 (three) times daily.   omeprazole 20  MG capsule Commonly known as: PRILOSEC Take 20 mg by mouth daily.   ondansetron 4 MG disintegrating tablet Commonly known as: ZOFRAN-ODT Take 1 tablet (4 mg total) by mouth every 8 (eight) hours as needed for nausea or vomiting.   sodium bicarbonate 325 MG tablet Take 1 tablet (325 mg total) by mouth 4 (four) times daily -  before meals and at bedtime.   traZODone 100 MG tablet Commonly known as: DESYREL Take 1 tablet (100 mg total) by mouth at bedtime.        Allergies:  Allergies  Allergen Reactions   Codeine Nausea And Vomiting   Pegasys [Peginterferon Alfa-2a] Hives    Family History: Family History  Problem Relation Age of Onset   Heart failure Mother    Lung cancer Father    Alcohol abuse Brother    Leukemia Brother    Colon cancer Neg Hx     Social History:  reports that she has quit smoking. Her smoking use included cigarettes. She has a 10 pack-year smoking history. She has been exposed to tobacco smoke. She has never used smokeless tobacco. She reports current alcohol use. She reports that she does not use drugs.  ROS: All other review of systems were reviewed and are negative except what is noted above in HPI  Physical Exam: BP (!) 146/76   Pulse (!) 109   Constitutional:  Alert and oriented, No acute distress. HEENT: [place] AT, moist mucus membranes.  Trachea midline, no masses. Cardiovascular: No clubbing, cyanosis, or edema. Respiratory: Normal respiratory effort, no increased work of breathing. GI: Abdomen is soft, nontender, nondistended, no abdominal masses GU: No CVA tenderness.  Lymph: No cervical or inguinal lymphadenopathy. Skin: No rashes, bruises or suspicious lesions. Neurologic: Grossly intact, no focal deficits, moving all 4 extremities. Psychiatric: Normal mood and affect.  Laboratory Data: Lab Results  Component Value Date   WBC 10.4 08/18/2023   HGB 13.3 08/19/2023   HCT 39.0 08/19/2023   MCV 88.3 08/18/2023   PLT 544 (H)  08/18/2023    Lab Results  Component Value Date   CREATININE 0.75 08/31/2023    No results found for: "PSA"  No results found for: "TESTOSTERONE"  No results found for: "HGBA1C"  Urinalysis    Component Value Date/Time   COLORURINE YELLOW 08/10/2023 1417   APPEARANCEUR CLEAR 08/10/2023 1417   APPEARANCEUR Clear 02/17/2023 1439   LABSPEC 1.046 (H) 08/10/2023 1417   PHURINE 7.0 08/10/2023 1417   GLUCOSEU NEGATIVE 08/10/2023 1417   HGBUR NEGATIVE 08/10/2023 1417   BILIRUBINUR NEGATIVE 08/10/2023 1417   BILIRUBINUR Negative 02/17/2023 1439   KETONESUR NEGATIVE 08/10/2023 1417   PROTEINUR TRACE (A) 08/10/2023 1417   UROBILINOGEN 0.2 09/03/2021 1147   UROBILINOGEN 0.2 03/20/2013 0805   NITRITE NEGATIVE  08/10/2023 1417   LEUKOCYTESUR NEGATIVE 08/10/2023 1417    Lab Results  Component Value Date   LABMICR See below: 02/17/2023   WBCUA 0-5 02/17/2023   LABEPIT 0-10 02/17/2023   MUCUS Present 11/06/2021   BACTERIA NONE SEEN 08/10/2023    Pertinent Imaging:  No results found for this or any previous visit.  No results found for this or any previous visit.  No results found for this or any previous visit.  No results found for this or any previous visit.  No results found for this or any previous visit.  No valid procedures specified. Results for orders placed during the hospital encounter of 09/23/21  CT HEMATURIA WORKUP  Narrative CLINICAL DATA:  Microscopic hematuria, frequent UTIs  EXAM: CT ABDOMEN AND PELVIS WITHOUT AND WITH CONTRAST  TECHNIQUE: Multidetector CT imaging of the abdomen and pelvis was performed following the standard protocol before and following the bolus administration of intravenous contrast.  CONTRAST:  25mL OMNIPAQUE IOHEXOL 350 MG/ML SOLN  COMPARISON:  05/18/2020  FINDINGS: Lower chest: No acute abnormality.  Coronary artery calcification.  Hepatobiliary: No focal liver abnormality is seen. Status post cholecystectomy. No  biliary dilatation.  Pancreas: Unremarkable. No pancreatic ductal dilatation or surrounding inflammatory changes.  Spleen: Normal in size without significant abnormality.  Adrenals/Urinary Tract: Adrenal glands are unremarkable. Kidneys are normal, without renal calculi, solid lesion, or hydronephrosis. No urinary tract filling defect on delayed phase imaging. Bladder is unremarkable.  Stomach/Bowel: Stomach is within normal limits. Appendix is not clearly visualized. No evidence of bowel wall thickening, distention, or inflammatory changes. Sigmoid diverticula.  Vascular/Lymphatic: Aortic atherosclerosis. No enlarged abdominal or pelvic lymph nodes.  Reproductive: No mass or other significant abnormality.  Other: No abdominal wall hernia or abnormality. No abdominopelvic ascites.  Musculoskeletal: No acute or significant osseous findings.  IMPRESSION: 1. No CT findings to explain hematuria. No evidence of urinary tract calculus, mass, or hydronephrosis. No urinary tract filling defect on delayed phase imaging. 2. Sigmoid diverticulosis without evidence for diverticulitis. 3. Coronary artery disease.  Aortic Atherosclerosis (ICD10-I70.0).   Electronically Signed By: Jearld Lesch M.D. On: 09/25/2021 08:51  No results found for this or any previous visit.   Assessment & Plan:    1. Microscopic hematuria Followup 1 year - Urinalysis, Routine w reflex microscopic  2. Atrophic vaginitis Continue estrace cream 5x per week   No follow-ups on file.  Wilkie Aye, MD  [facility] [place]

## 2023-10-19 ENCOUNTER — Encounter: Payer: Self-pay | Admitting: Urology

## 2023-10-19 NOTE — Patient Instructions (Signed)

## 2023-10-21 DIAGNOSIS — R809 Proteinuria, unspecified: Secondary | ICD-10-CM | POA: Insufficient documentation

## 2023-10-22 DIAGNOSIS — R7301 Impaired fasting glucose: Secondary | ICD-10-CM | POA: Diagnosis not present

## 2023-10-22 DIAGNOSIS — J9601 Acute respiratory failure with hypoxia: Secondary | ICD-10-CM | POA: Diagnosis not present

## 2023-10-22 DIAGNOSIS — Z86011 Personal history of benign neoplasm of the brain: Secondary | ICD-10-CM | POA: Diagnosis not present

## 2023-10-22 DIAGNOSIS — J449 Chronic obstructive pulmonary disease, unspecified: Secondary | ICD-10-CM | POA: Diagnosis not present

## 2023-10-22 DIAGNOSIS — I10 Essential (primary) hypertension: Secondary | ICD-10-CM | POA: Diagnosis not present

## 2023-10-22 DIAGNOSIS — E878 Other disorders of electrolyte and fluid balance, not elsewhere classified: Secondary | ICD-10-CM | POA: Diagnosis not present

## 2023-10-22 DIAGNOSIS — Z0000 Encounter for general adult medical examination without abnormal findings: Secondary | ICD-10-CM | POA: Diagnosis not present

## 2023-10-22 DIAGNOSIS — E039 Hypothyroidism, unspecified: Secondary | ICD-10-CM | POA: Diagnosis not present

## 2023-10-22 DIAGNOSIS — K219 Gastro-esophageal reflux disease without esophagitis: Secondary | ICD-10-CM | POA: Diagnosis not present

## 2023-10-22 DIAGNOSIS — D72828 Other elevated white blood cell count: Secondary | ICD-10-CM | POA: Diagnosis not present

## 2023-10-22 DIAGNOSIS — G40909 Epilepsy, unspecified, not intractable, without status epilepticus: Secondary | ICD-10-CM | POA: Diagnosis not present

## 2023-10-22 DIAGNOSIS — D649 Anemia, unspecified: Secondary | ICD-10-CM | POA: Diagnosis not present

## 2023-11-04 ENCOUNTER — Ambulatory Visit (INDEPENDENT_AMBULATORY_CARE_PROVIDER_SITE_OTHER): Payer: Medicare Other | Admitting: Gastroenterology

## 2023-11-08 ENCOUNTER — Encounter (INDEPENDENT_AMBULATORY_CARE_PROVIDER_SITE_OTHER): Payer: Self-pay | Admitting: Gastroenterology

## 2023-11-08 ENCOUNTER — Ambulatory Visit (INDEPENDENT_AMBULATORY_CARE_PROVIDER_SITE_OTHER): Payer: Medicare Other | Admitting: Gastroenterology

## 2023-11-08 VITALS — BP 163/86 | HR 69 | Temp 97.8°F | Ht 65.0 in | Wt 118.3 lb

## 2023-11-08 DIAGNOSIS — R7989 Other specified abnormal findings of blood chemistry: Secondary | ICD-10-CM | POA: Diagnosis not present

## 2023-11-08 DIAGNOSIS — Z862 Personal history of diseases of the blood and blood-forming organs and certain disorders involving the immune mechanism: Secondary | ICD-10-CM | POA: Diagnosis not present

## 2023-11-08 DIAGNOSIS — R197 Diarrhea, unspecified: Secondary | ICD-10-CM | POA: Diagnosis not present

## 2023-11-08 NOTE — Patient Instructions (Signed)
Continue with metamucil as you are doing and be mindful of trigger food that cause your diarrhea Increase water intake, aim for atleast 64 oz per day Increase fruits, veggies and whole grains, kiwi and prunes are especially good for constipation As discussed, one of your iron numbers was high (ferritin) sometimes this can be in the setting of inflammation or alcohol use. We will recheck this again in February and I will be in touch regarding these results. Avoid taking any iron containing supplements for now.  Follow up 1 year

## 2023-11-08 NOTE — Progress Notes (Signed)
Referring Provider: Benita Stabile, MD Primary Care Physician:  Benita Stabile, MD Primary GI Physician: Dr. Tasia Catchings   Chief Complaint  Patient presents with   Follow-up    Patient here today for follow up on her chronic diarrhea. Patient says this is better.    HPI:   Catherine Gonzalez is a 72 y.o. female with past medical history of Hep C SVR 2016 , HTN, hypothyroidism, remote childhood history of brain tumor-with residual of left sided weakness with gait abnormality , recent hip fracture , COPD   Patient presenting today for follow up of diarrhea  Last seen October 2024, at that time reported liquid BMs, 2-3 per day, certain foods make it worse. Recently completed 14 day course of  Recommended start metamucil, avoiding olmesartan, recheck iron studies, consider capsule endoscopy if persistent IDA.  Iron studies on 10/1 WNL, advised an stop iron (ferritin 960)  Present:  She is doing metamucil gummies, having 1 solid BM per day usually, sometimes will have 3-4 episodes of diarrhea with certain foods like cheeseburgers or other greasy foods but states she does not plan to avoid these foods. Denies abdominal pain. No rectal bleeding or melena. No changes in appetite or weight loss. No nausea or vomiting. States she has not been on any iron pills recently that she is aware of.  Does note She does not she had a recent sinus infection, drinks 2-3 beers per night.   Last EGD:08/2023 - Normal esophagus. - Erythematous mucosa in the stomach. Biopsied. - Erythematous duodenopathy. Biopsied. Last Colonoscopy:08/2023 - Diverticulosis in the entire examined colon. - Five 3 to 9 mm polyps in the rectum, in the sigmoid colon, in the transverse colon and in the ascending colon, removed with a cold snare. Resected and retrieved. - Congested mucosa in the entire examined colon. Biopsied. - Non- bleeding external and internal hemorrhoids. - The examined portion of the ileum was normal.   A. DUODENUM,  RANDOM, BIOPSY: - Benign small bowel mucosa with focal foveolar metaplasia, suggestive of peptic injury  B. STOMACH, RANDOM, BIOPSY: - Gastric antral and oxyntic mucosa with features of reactive gastropathy - Negative for H. pylori on HE stain - Negative for intestinal metaplasia or malignancy   C. COLON, RIGHT, RANDOM, BIOPSY: - Benign colonic mucosa with no specific pathologic changes - Negative for increased intraepithelial lymphocytes or thickened subepithelial collagen table - Negative for dysplasia or malignancy  D. COLON, LEFT, RANDOM, BIOPSY: - Benign colonic mucosa with no specific pathologic changes - Negative for increased intraepithelial lymphocytes or thickened subepithelial collagen table - Negative for dysplasia or malignancy  E. COLON, ASCENDING, TRANSVERSE, SIGMOID, RECTAL COLON, POLYPECTOMY: - Sessile serrated polyp(s). - No dysplasia or malignancy. - Tubular adenoma. - No high grade dysplasia or malignancy. - Hyperplastic polyp. - No dysplasia or malignancy.    Repeat 3 years    Past Medical History:  Diagnosis Date   Allergic rhinitis    Anemia    Anxiety    Blood transfusion without reported diagnosis    Depression    Ectopic pregnancy    Finger fracture, left    index   GERD (gastroesophageal reflux disease)    Hepatitis C    Hypertension    Hypothyroidism    Seizures (HCC)     Past Surgical History:  Procedure Laterality Date   APPENDECTOMY     BIOPSY  08/19/2023   Procedure: BIOPSY;  Surgeon: Franky Macho, MD;  Location: [facility];  Service:  Endoscopy;;   BRAIN SURGERY     x 3   BREAST ENHANCEMENT SURGERY     CHOLECYSTECTOMY     CLOSED REDUCTION FINGER WITH PERCUTANEOUS PINNING Left 07/25/2020   Procedure: CLOSED REDUCTION FINGER WITH PERCUTANEOUS PINNING;  Surgeon: Cindee Salt, MD;  Location: [facility];  Service: Orthopedics;  Laterality: Left;  AXILLARY BLOCK   COLONOSCOPY WITH PROPOFOL N/A 08/19/2023    Procedure: COLONOSCOPY WITH PROPOFOL;  Surgeon: Franky Macho, MD;  Location: [facility];  Service: Endoscopy;  Laterality: N/A;  1:00PM;ASA 3   ECTOPIC PREGNANCY SURGERY     ESOPHAGOGASTRODUODENOSCOPY (EGD) WITH PROPOFOL N/A 08/19/2023   Procedure: ESOPHAGOGASTRODUODENOSCOPY (EGD) WITH PROPOFOL;  Surgeon: Franky Macho, MD;  Location: [facility];  Service: Endoscopy;  Laterality: N/A;  1:00PM;ASA 3   LUMBAR SPINE SURGERY     POLYPECTOMY  08/19/2023   Procedure: POLYPECTOMY;  Surgeon: Franky Macho, MD;  Location: [facility];  Service: Endoscopy;;   TONSILLECTOMY AND ADENOIDECTOMY      Current Outpatient Medications  Medication Sig Dispense Refill   acetaminophen (TYLENOL) 325 MG tablet Take 2 tablets (650 mg total) by mouth every 6 (six) hours as needed for mild pain (or Fever >/= 101). 100 tablet 2   amLODipine (NORVASC) 5 MG tablet Take 1 tablet (5 mg total) by mouth daily. 90 tablet 3   busPIRone (BUSPAR) 10 MG tablet Take 1 tablet (10 mg total) by mouth 2 (two) times daily. 60 tablet 3   cholecalciferol (VITAMIN D3) 25 MCG (1000 UNIT) tablet Take 1,000 Units by mouth daily.     estradiol (ESTRACE) 0.1 MG/GM vaginal cream Place 1 Applicatorful vaginally once a week. APPLY 1 GRAM VAGINALLY 5 TIMES PER WEEK 42.5 g 11   fluticasone (FLONASE) 50 MCG/ACT nasal spray Place 2 sprays into both nostrils daily. (Patient taking differently: Place 2 sprays into both nostrils daily as needed for rhinitis or allergies.) 11.1 mL 0   levETIRAcetam (KEPPRA) 750 MG tablet TAKE 1 TABLET TWICE A DAY (Patient taking differently: Take 750 mg by mouth 2 (two) times daily. Takes daily at 9:30am and 5pm) 180 tablet 3   levocetirizine (XYZAL) 5 MG tablet Take 5 mg by mouth daily.     levothyroxine (SYNTHROID) 75 MCG tablet Take 75 mcg by mouth daily before breakfast.     omeprazole (PRILOSEC) 20 MG capsule Take 20 mg by mouth daily.     sodium bicarbonate 325 MG tablet Take 1 tablet (325 mg total)  by mouth 4 (four) times daily -  before meals and at bedtime. 120 tablet 4   telmisartan (MICARDIS) 40 MG tablet Take 1 tablet by mouth daily.     traZODone (DESYREL) 100 MG tablet Take 1 tablet (100 mg total) by mouth at bedtime. 30 tablet 5   No current facility-administered medications for this visit.    Allergies as of 11/08/2023 - Review Complete 11/08/2023  Allergen Reaction Noted   Codeine Nausea And Vomiting 05/25/2013   Pegasys [peginterferon alfa-2a] Hives 05/08/2013    Family History  Problem Relation Age of Onset   Heart failure Mother    Lung cancer Father    Alcohol abuse Brother    Leukemia Brother    Colon cancer Neg Hx     Social History   Socioeconomic History   Marital status: Widowed    Spouse name: Not on file   Number of children: 0   Years of education: Not on file   Highest  education level: Not on file  Occupational History   Not on file  Tobacco Use   Smoking status: Former    Current packs/day: 0.25    Average packs/day: 0.3 packs/day for 40.0 years (10.0 ttl pk-yrs)    Types: Cigarettes    Passive exposure: Past   Smokeless tobacco: Never  Vaping Use   Vaping status: Never Used  Substance and Sexual Activity   Alcohol use: Yes    Comment: OCC   Drug use: No   Sexual activity: Never  Other Topics Concern   Not on file  Social History Narrative   Not on file   Social Determinants of Health   Financial Resource Strain: Not on file  Food Insecurity: No Food Insecurity (03/27/2023)   Hunger Vital Sign    Worried About Running Out of Food in the Last Year: Never true    Ran Out of Food in the Last Year: Never true  Transportation Needs: No Transportation Needs (03/27/2023)   PRAPARE - Administrator, Civil Service (Medical): No    Lack of Transportation (Non-Medical): No  Physical Activity: Not on file  Stress: Not on file  Social Connections: Not on file    Review of systems General: negative for malaise, night sweats,  fever, chills, weight loss Neck: Negative for lumps, goiter, pain and significant neck swelling Resp: Negative for cough, wheezing, dyspnea at rest CV: Negative for chest pain, leg swelling, palpitations, orthopnea GI: denies melena, hematochezia, nausea, vomiting, constipation, dysphagia, odyonophagia, early satiety or unintentional weight loss. +occasional diarrhea  MSK: Negative for joint pain or swelling, back pain, and muscle pain. Derm: Negative for itching or rash Psych: Denies depression, anxiety, memory loss, confusion. No homicidal or suicidal ideation.  Heme: Negative for prolonged bleeding, bruising easily, and swollen nodes. Endocrine: Negative for cold or heat intolerance, polyuria, polydipsia and goiter. Neuro: negative for tremor, gait imbalance, syncope and seizures. The remainder of the review of systems is noncontributory.  Physical Exam: BP (!) 163/86 (BP Location: Left Arm, Patient Position: Sitting, Cuff Size: Normal)   Pulse 69   Temp 97.8 F (36.6 C) (Temporal)   Ht 5\' 5"  (1.651 m)   Wt 118 lb 4.8 oz (53.7 kg)   BMI 19.69 kg/m  General:   Alert and oriented. No distress noted. Pleasant and cooperative.  Head:  Normocephalic and atraumatic. Eyes:  Conjuctiva clear without scleral icterus. Mouth:  Oral mucosa pink and moist. Good dentition. No lesions. Heart: Normal rate and rhythm, s1 and s2 heart sounds present.  Lungs: Clear lung sounds in all lobes. Respirations equal and unlabored. Abdomen:  +BS, soft, non-tender and non-distended. No rebound or guarding. No HSM or masses noted. Derm: No palmar erythema or jaundice Msk:  Symmetrical without gross deformities. Normal posture. Extremities:  Without edema. Neurologic:  Alert and  oriented x4 Psych:  Alert and cooperative. Normal mood and affect.  Invalid input(s): "6 MONTHS"   ASSESSMENT: Catherine Gonzalez is a 72 y.o. female presenting today to follow up on diarrhea  Patient mostly having 1 solid BM  per day taking Metamucil, will have upwards of 3-4 BMs per day depending on certain food she eats, mostly with greasy or foods.  She is status post cholecystectomy many years ago which is likely contributing to this.  She denies any abdominal pain, nausea, vomiting, rectal bleeding, melena.  She has no GI complaints today.  Recommended to continue with Metamucil, good water intake, avoiding trigger foods.  Notably patient  with history of iron deficiency anemia, recent EGD and colonoscopy in September without findings to suggest etiology of her IDA.  Recent iron studies had normalized other than ferritin which was 960.  She denies any recent iron supplementation.  She did have a recent sinus infection, notably does drink 2-3 beers per night which may be contributing to her elevated ferritin.  Given ferritin elevation can be acute phase reactant we will plan to recheck this again in a few months.  If ferritin is still elevated we will consider possible hemochromatosis testing/referral to hematology at that time.   PLAN:  Repeat ferritin in February  2. Consider hematology referral if ferritin still elevated  3. Continue metamucil  4.  good water intake  5. Continue to avoid trigger foods.   All questions were answered, patient verbalized understanding and is in agreement with plan as outlined above.   Follow Up: 1 year   Sissy Goetzke L. Jeanmarie Hubert, MSN, APRN, AGNP-C Adult-Gerontology Nurse Practitioner [facility]

## 2023-11-10 ENCOUNTER — Ambulatory Visit: Payer: Medicare Other | Admitting: Orthopedic Surgery

## 2023-11-16 ENCOUNTER — Ambulatory Visit: Payer: Medicare Other | Admitting: Orthopedic Surgery

## 2023-11-17 ENCOUNTER — Ambulatory Visit: Payer: Medicare Other | Admitting: Orthopedic Surgery

## 2023-11-17 VITALS — BP 158/76 | HR 98 | Ht 65.0 in | Wt 118.0 lb

## 2023-11-17 DIAGNOSIS — M19011 Primary osteoarthritis, right shoulder: Secondary | ICD-10-CM | POA: Diagnosis not present

## 2023-11-17 NOTE — Patient Instructions (Signed)

## 2023-11-17 NOTE — Progress Notes (Signed)
New Patient Visit  Assessment: Catherine Gonzalez is a 72 y.o. female with the following: 1. Glenohumeral arthritis, right  Plan: Catherine Gonzalez has right shoulder pain, primarily in the posterior aspect of the shoulder.  Prior injection with limited improvement.  She relies heavily on her right arm due to remote history of a stroke affecting the left side.  Not interested in surgery.  Discussed a GH injection.  Procedure note injection - Right shoulder, ultrasound guidance   Verbal consent was obtained to inject the Right shoulder, glenohumeral joint  Timeout was completed to confirm the site of injection.   Using the ultrasound, the rotator cuff tendons were identified.  The joint space was also identified. The skin was prepped with alcohol and ethyl chloride was sprayed at the injection site.  A 21-gauge needle was used to inject 40 mg of Depo-Medrol and 1% lidocaine (4 cc) into the glenohumeral joint space of the Right shoulder using a posterolateral approach.  The needle was visualized entering the glenohumeral joint, and the medication was also visualized. There were no complications.  A sterile bandage was applied.   Note: In order to accurately identify the placement of the needle, ultrasound was required, to increase the accuracy, and specificity of the injection.    Follow-up: Return if symptoms worsen or fail to improve.  Subjective:  Chief Complaint  Patient presents with   Shoulder Pain    R shoulder pain, states the shot didn't really help and now the pain is worse.     History of Present Illness: Catherine Gonzalez is a 72 y.o. female who returns for evaluation of right shoulder pain.  I saw her in clinic several months ago.  Right shoulder injected, subacromial.  Limited improvement in symptoms, now pain is worse.  Uses cane in right hand.  Limited use of the left due to prior stroke.    Review of Systems: No fevers or chills No numbness or tingling No  chest pain No shortness of breath No bowel or bladder dysfunction No GI distress No headaches    Objective: BP (!) 158/76   Pulse 98   Ht 5\' 5"  (1.651 m)   Wt 118 lb (53.5 kg)   BMI 19.64 kg/m   Physical Exam:  General: Elderly female., Alert and oriented., and No acute distress. Gait: Ambulates with the assistance of a cane.  Evaluation of right shoulder is without deformity.  No bruising.  No redness.  She has tenderness to palpation over the posterior aspect of the shoulder, in line with the supraspinatus.  She has good range of motion, including forward flexion to 160 degrees.  Internal rotation lumbar spine.  Good strength, mild discomfort.  Sensation intact throughout the right hand.  Negative Hoffmann.  IMAGING: I personally ordered and reviewed the following images  No new imaging obtained today.   New Medications:  No orders of the defined types were placed in this encounter.     Oliver Barre, MD  11/19/2023 10:58 PM

## 2023-11-19 ENCOUNTER — Encounter: Payer: Self-pay | Admitting: Orthopedic Surgery

## 2023-11-30 DIAGNOSIS — R300 Dysuria: Secondary | ICD-10-CM | POA: Diagnosis not present

## 2024-02-10 ENCOUNTER — Other Ambulatory Visit (INDEPENDENT_AMBULATORY_CARE_PROVIDER_SITE_OTHER): Payer: Self-pay | Admitting: Gastroenterology

## 2024-02-10 DIAGNOSIS — R7989 Other specified abnormal findings of blood chemistry: Secondary | ICD-10-CM | POA: Diagnosis not present

## 2024-02-11 LAB — FERRITIN: Ferritin: 466 ng/mL — ABNORMAL HIGH (ref 16–288)

## 2024-02-17 ENCOUNTER — Telehealth (INDEPENDENT_AMBULATORY_CARE_PROVIDER_SITE_OTHER): Payer: Self-pay

## 2024-02-17 DIAGNOSIS — R7989 Other specified abnormal findings of blood chemistry: Secondary | ICD-10-CM

## 2024-02-21 DIAGNOSIS — R7989 Other specified abnormal findings of blood chemistry: Secondary | ICD-10-CM | POA: Diagnosis not present

## 2024-02-22 ENCOUNTER — Encounter: Payer: Self-pay | Admitting: Urology

## 2024-02-25 NOTE — Telephone Encounter (Signed)
 Catherine Gonzalez

## 2024-03-01 DIAGNOSIS — H524 Presbyopia: Secondary | ICD-10-CM | POA: Diagnosis not present

## 2024-03-01 LAB — HEMOCHROMATOSIS DNA-PCR(C282Y,H63D)

## 2024-03-23 DIAGNOSIS — E039 Hypothyroidism, unspecified: Secondary | ICD-10-CM | POA: Diagnosis not present

## 2024-03-23 DIAGNOSIS — R0981 Nasal congestion: Secondary | ICD-10-CM | POA: Diagnosis not present

## 2024-03-23 DIAGNOSIS — Z79899 Other long term (current) drug therapy: Secondary | ICD-10-CM | POA: Diagnosis not present

## 2024-03-23 DIAGNOSIS — Z79890 Hormone replacement therapy (postmenopausal): Secondary | ICD-10-CM | POA: Diagnosis not present

## 2024-03-31 ENCOUNTER — Encounter: Payer: Self-pay | Admitting: Orthopedic Surgery

## 2024-03-31 ENCOUNTER — Ambulatory Visit (INDEPENDENT_AMBULATORY_CARE_PROVIDER_SITE_OTHER): Admitting: Orthopedic Surgery

## 2024-03-31 VITALS — BP 145/80 | HR 90

## 2024-03-31 DIAGNOSIS — M19011 Primary osteoarthritis, right shoulder: Secondary | ICD-10-CM

## 2024-03-31 MED ORDER — TRAMADOL HCL 50 MG PO TABS
50.0000 mg | ORAL_TABLET | Freq: Two times a day (BID) | ORAL | 0 refills | Status: AC | PRN
Start: 2024-03-31 — End: ?

## 2024-03-31 MED ORDER — METHYLPREDNISOLONE ACETATE 40 MG/ML IJ SUSP
40.0000 mg | Freq: Once | INTRAMUSCULAR | Status: AC
Start: 2024-03-31 — End: 2024-03-31
  Administered 2024-03-31: 40 mg via INTRA_ARTICULAR

## 2024-03-31 NOTE — Patient Instructions (Signed)

## 2024-03-31 NOTE — Progress Notes (Signed)
 Return patient Visit  Assessment: Catherine Gonzalez is a 72 y.o. female with the following: 1. Glenohumeral arthritis, right  Plan: Catherine Gonzalez continues to have right shoulder pain.  Prior steroid injection took the edge off, but this has since worn off.  We did discuss the possibility of proceeding with right total shoulder arthroplasty versus reverse shoulder arthroplasty.  Procedure was discussed in detail.  All questions have been answered.  If she is interested in pursuing surgery, we will have to obtain medical clearance.  We will also obtain a CT scan prior to scheduling surgery.  She states her understanding.  In clinic today, she would like to proceed with another injection.  This was completed without issues.  Procedure note injection - Right shoulder, ultrasound guidance   Verbal consent was obtained to inject the Right shoulder, glenohumeral joint  Timeout was completed to confirm the site of injection.   Using the ultrasound, the rotator cuff tendons were identified.  The joint space was also identified. The skin was prepped with alcohol and ethyl chloride was sprayed at the injection site.  A 21-gauge needle was used to inject 40 mg of Depo-Medrol and 1% lidocaine  (4 cc) into the glenohumeral joint space of the Right shoulder using a posterolateral approach.  The needle was visualized entering the glenohumeral joint, and the medication was also visualized. There were no complications.  A sterile bandage was applied.   Note: In order to accurately identify the placement of the needle, ultrasound was required, to increase the accuracy, and specificity of the injection.    Follow-up: Return if symptoms worsen or fail to improve.  Subjective:  Chief Complaint  Patient presents with   Injections    Rt shoulder pain- taking advil for pain. Wants injection if can.     History of Present Illness: Catherine Gonzalez is a 73 y.o. female who returns for evaluation of  right shoulder pain.  I have seen her several times in clinic previously for right shoulder pain.  Last visit was several months ago.  We proceeded with a glenohumeral joint injection.  She states that it took the edge off her pain, but her pain has returned.  She has known arthritis.  She does have a history of stroke, affecting her left side.  Despite the right shoulder being problematic, she continues to hold her cane in her right hand.  Review of Systems: No fevers or chills No numbness or tingling No chest pain No shortness of breath No bowel or bladder dysfunction No GI distress No headaches    Objective: BP (!) 145/80   Pulse 90   Physical Exam:  General: Elderly female., Alert and oriented., and No acute distress. Gait: Ambulates with the assistance of a cane.  Evaluation of right shoulder is without deformity.  No bruising.  No redness.  She has tenderness to palpation over the posterior aspect of the shoulder, in line with the supraspinatus.  She has good range of motion, including forward flexion to 160 degrees.  Internal rotation lumbar spine.  Good strength, mild discomfort.  Sensation intact throughout the right hand.  Negative Hoffmann.  IMAGING: I personally ordered and reviewed the following images  No new imaging obtained today.   New Medications:  Meds ordered this encounter  Medications   traMADol  (ULTRAM ) 50 MG tablet    Sig: Take 1 tablet (50 mg total) by mouth every 12 (twelve) hours as needed.    Dispense:  30 tablet  Refill:  0   methylPREDNISolone acetate (DEPO-MEDROL) injection 40 mg      Tonita Frater, MD  03/31/2024 11:43 PM

## 2024-04-04 DIAGNOSIS — E559 Vitamin D deficiency, unspecified: Secondary | ICD-10-CM | POA: Diagnosis not present

## 2024-04-04 DIAGNOSIS — E039 Hypothyroidism, unspecified: Secondary | ICD-10-CM | POA: Diagnosis not present

## 2024-04-04 DIAGNOSIS — R7301 Impaired fasting glucose: Secondary | ICD-10-CM | POA: Diagnosis not present

## 2024-04-04 DIAGNOSIS — I10 Essential (primary) hypertension: Secondary | ICD-10-CM | POA: Diagnosis not present

## 2024-04-04 DIAGNOSIS — D510 Vitamin B12 deficiency anemia due to intrinsic factor deficiency: Secondary | ICD-10-CM | POA: Diagnosis not present

## 2024-04-04 DIAGNOSIS — D649 Anemia, unspecified: Secondary | ICD-10-CM | POA: Diagnosis not present

## 2024-04-07 ENCOUNTER — Telehealth (INDEPENDENT_AMBULATORY_CARE_PROVIDER_SITE_OTHER): Payer: Self-pay | Admitting: *Deleted

## 2024-04-07 ENCOUNTER — Encounter (INDEPENDENT_AMBULATORY_CARE_PROVIDER_SITE_OTHER): Payer: Self-pay | Admitting: *Deleted

## 2024-04-07 ENCOUNTER — Other Ambulatory Visit (INDEPENDENT_AMBULATORY_CARE_PROVIDER_SITE_OTHER): Payer: Self-pay | Admitting: *Deleted

## 2024-04-07 NOTE — Telephone Encounter (Addendum)
 Copied from reminder file. Lab order with letter mailed to patient.   ----- Message from Nurse Mazie Speed sent at 03/02/2024  2:40 PM EDT ----- Regarding: labs Repeat ferritin in June per che[place]. Dx : elevated ferritin.

## 2024-04-12 ENCOUNTER — Other Ambulatory Visit (HOSPITAL_COMMUNITY): Payer: Self-pay | Admitting: Nurse Practitioner

## 2024-04-12 DIAGNOSIS — E782 Mixed hyperlipidemia: Secondary | ICD-10-CM | POA: Diagnosis not present

## 2024-04-12 DIAGNOSIS — I10 Essential (primary) hypertension: Secondary | ICD-10-CM | POA: Diagnosis not present

## 2024-04-12 DIAGNOSIS — R809 Proteinuria, unspecified: Secondary | ICD-10-CM | POA: Diagnosis not present

## 2024-04-12 DIAGNOSIS — G40909 Epilepsy, unspecified, not intractable, without status epilepticus: Secondary | ICD-10-CM | POA: Diagnosis not present

## 2024-04-12 DIAGNOSIS — E039 Hypothyroidism, unspecified: Secondary | ICD-10-CM | POA: Diagnosis not present

## 2024-04-12 DIAGNOSIS — R7301 Impaired fasting glucose: Secondary | ICD-10-CM | POA: Diagnosis not present

## 2024-04-12 DIAGNOSIS — Z122 Encounter for screening for malignant neoplasm of respiratory organs: Secondary | ICD-10-CM

## 2024-04-12 DIAGNOSIS — D72828 Other elevated white blood cell count: Secondary | ICD-10-CM | POA: Diagnosis not present

## 2024-04-12 DIAGNOSIS — Z86011 Personal history of benign neoplasm of the brain: Secondary | ICD-10-CM | POA: Diagnosis not present

## 2024-04-12 DIAGNOSIS — D649 Anemia, unspecified: Secondary | ICD-10-CM | POA: Diagnosis not present

## 2024-07-06 DIAGNOSIS — F17200 Nicotine dependence, unspecified, uncomplicated: Secondary | ICD-10-CM | POA: Diagnosis not present

## 2024-07-06 DIAGNOSIS — J069 Acute upper respiratory infection, unspecified: Secondary | ICD-10-CM | POA: Diagnosis not present

## 2024-07-06 DIAGNOSIS — J449 Chronic obstructive pulmonary disease, unspecified: Secondary | ICD-10-CM | POA: Diagnosis not present

## 2024-07-13 DIAGNOSIS — E039 Hypothyroidism, unspecified: Secondary | ICD-10-CM | POA: Diagnosis not present

## 2024-07-13 DIAGNOSIS — R7301 Impaired fasting glucose: Secondary | ICD-10-CM | POA: Diagnosis not present

## 2024-07-13 DIAGNOSIS — I10 Essential (primary) hypertension: Secondary | ICD-10-CM | POA: Diagnosis not present

## 2024-07-13 DIAGNOSIS — D649 Anemia, unspecified: Secondary | ICD-10-CM | POA: Diagnosis not present

## 2024-07-13 DIAGNOSIS — E559 Vitamin D deficiency, unspecified: Secondary | ICD-10-CM | POA: Diagnosis not present

## 2024-07-13 DIAGNOSIS — R809 Proteinuria, unspecified: Secondary | ICD-10-CM | POA: Diagnosis not present

## 2024-07-14 DIAGNOSIS — R809 Proteinuria, unspecified: Secondary | ICD-10-CM | POA: Diagnosis not present

## 2024-07-19 DIAGNOSIS — G40909 Epilepsy, unspecified, not intractable, without status epilepticus: Secondary | ICD-10-CM | POA: Diagnosis not present

## 2024-07-19 DIAGNOSIS — J449 Chronic obstructive pulmonary disease, unspecified: Secondary | ICD-10-CM | POA: Diagnosis not present

## 2024-07-19 DIAGNOSIS — I10 Essential (primary) hypertension: Secondary | ICD-10-CM | POA: Diagnosis not present

## 2024-07-19 DIAGNOSIS — E039 Hypothyroidism, unspecified: Secondary | ICD-10-CM | POA: Diagnosis not present

## 2024-07-19 DIAGNOSIS — E782 Mixed hyperlipidemia: Secondary | ICD-10-CM | POA: Diagnosis not present

## 2024-07-19 DIAGNOSIS — R809 Proteinuria, unspecified: Secondary | ICD-10-CM | POA: Diagnosis not present

## 2024-07-19 DIAGNOSIS — R7301 Impaired fasting glucose: Secondary | ICD-10-CM | POA: Diagnosis not present

## 2024-07-19 DIAGNOSIS — D72828 Other elevated white blood cell count: Secondary | ICD-10-CM | POA: Diagnosis not present

## 2024-07-19 DIAGNOSIS — K219 Gastro-esophageal reflux disease without esophagitis: Secondary | ICD-10-CM | POA: Diagnosis not present

## 2024-08-30 DIAGNOSIS — Z23 Encounter for immunization: Secondary | ICD-10-CM | POA: Diagnosis not present

## 2024-08-30 DIAGNOSIS — Z0001 Encounter for general adult medical examination with abnormal findings: Secondary | ICD-10-CM | POA: Diagnosis not present

## 2024-08-30 DIAGNOSIS — I10 Essential (primary) hypertension: Secondary | ICD-10-CM | POA: Diagnosis not present

## 2024-08-30 DIAGNOSIS — K529 Noninfective gastroenteritis and colitis, unspecified: Secondary | ICD-10-CM | POA: Diagnosis not present

## 2024-08-30 DIAGNOSIS — Z0000 Encounter for general adult medical examination without abnormal findings: Secondary | ICD-10-CM | POA: Diagnosis not present

## 2024-09-20 ENCOUNTER — Telehealth: Payer: Self-pay

## 2024-09-26 ENCOUNTER — Other Ambulatory Visit: Payer: Self-pay | Admitting: Urology

## 2024-09-26 MED ORDER — ESTRADIOL 0.01 % VA CREA: TOPICAL_CREAM | VAGINAL | 3 refills | Status: AC

## 2024-10-09 ENCOUNTER — Other Ambulatory Visit (HOSPITAL_COMMUNITY): Payer: Self-pay | Admitting: Nurse Practitioner

## 2024-10-09 DIAGNOSIS — Z79899 Other long term (current) drug therapy: Secondary | ICD-10-CM | POA: Diagnosis not present

## 2024-10-09 DIAGNOSIS — T148XXA Other injury of unspecified body region, initial encounter: Secondary | ICD-10-CM | POA: Diagnosis not present

## 2024-10-09 DIAGNOSIS — I10 Essential (primary) hypertension: Secondary | ICD-10-CM | POA: Diagnosis not present

## 2024-10-09 DIAGNOSIS — X58XXXA Exposure to other specified factors, initial encounter: Secondary | ICD-10-CM | POA: Diagnosis not present

## 2024-10-09 DIAGNOSIS — S91002A Unspecified open wound, left ankle, initial encounter: Secondary | ICD-10-CM | POA: Diagnosis not present

## 2024-10-12 ENCOUNTER — Ambulatory Visit (HOSPITAL_COMMUNITY)
Admission: RE | Admit: 2024-10-12 | Discharge: 2024-10-12 | Disposition: A | Discharge status: Home / Self Care | Source: Ambulatory Visit | Attending: Nurse Practitioner | Admitting: Nurse Practitioner

## 2024-10-12 ENCOUNTER — Other Ambulatory Visit (HOSPITAL_COMMUNITY): Payer: Self-pay | Admitting: Nurse Practitioner

## 2024-10-12 DIAGNOSIS — M19072 Primary osteoarthritis, left ankle and foot: Secondary | ICD-10-CM | POA: Diagnosis not present

## 2024-10-12 DIAGNOSIS — T148XXA Other injury of unspecified body region, initial encounter: Secondary | ICD-10-CM | POA: Insufficient documentation

## 2024-10-12 DIAGNOSIS — S99912A Unspecified injury of left ankle, initial encounter: Secondary | ICD-10-CM | POA: Diagnosis not present

## 2024-10-12 DIAGNOSIS — S99922A Unspecified injury of left foot, initial encounter: Secondary | ICD-10-CM | POA: Diagnosis not present

## 2024-10-12 DIAGNOSIS — M7732 Calcaneal spur, left foot: Secondary | ICD-10-CM | POA: Diagnosis not present

## 2024-10-18 ENCOUNTER — Ambulatory Visit: Admitting: Urology

## 2024-10-18 ENCOUNTER — Ambulatory Visit: Payer: Medicare Other | Admitting: Urology

## 2024-10-18 DIAGNOSIS — R3129 Other microscopic hematuria: Secondary | ICD-10-CM

## 2024-10-25 ENCOUNTER — Ambulatory Visit (HOSPITAL_COMMUNITY): Attending: Nurse Practitioner | Admitting: Physical Therapy

## 2024-10-25 ENCOUNTER — Other Ambulatory Visit: Payer: Self-pay

## 2024-10-25 DIAGNOSIS — M25572 Pain in left ankle and joints of left foot: Secondary | ICD-10-CM | POA: Insufficient documentation

## 2024-10-25 DIAGNOSIS — S91002S Unspecified open wound, left ankle, sequela: Secondary | ICD-10-CM | POA: Diagnosis not present

## 2024-10-25 NOTE — Therapy (Addendum)
 OUTPATIENT PHYSICAL THERAPY Wound  EVALUATION   Patient Name: Catherine Gonzalez MRN: [id number] DOB:02-08-1951, 73 y.o., female Today's Date: 10/25/2024   PCP: Norleen Hurst REFERRING PROVIDER: Joeann Browning, FNP  END OF SESSION:  PT End of Session - 10/25/24 1336     Visit Number 1    Number of Visits 8    Date for Recertification  11/22/24    Authorization Type [employer]    Authorization Time Period Auth placed    PT Start Time 1303    PT Stop Time 1339    PT Time Calculation (min) 36 min    Activity Tolerance Patient limited by pain    Behavior During Therapy WFL for tasks assessed/performed          Past Medical History:  Diagnosis Date   Allergic rhinitis    Anemia    Anxiety    Blood transfusion without reported diagnosis    Depression    Ectopic pregnancy    Finger fracture, left    index   GERD (gastroesophageal reflux disease)    Hepatitis C    Hypertension    Hypothyroidism    Seizures (HCC)    Past Surgical History:  Procedure Laterality Date   APPENDECTOMY     BIOPSY  08/19/2023   Procedure: BIOPSY;  Surgeon: Cinderella Deatrice FALCON, MD;  Location: [facility];  Service: Endoscopy;;   BRAIN SURGERY     x 3   BREAST ENHANCEMENT SURGERY     CHOLECYSTECTOMY     CLOSED REDUCTION FINGER WITH PERCUTANEOUS PINNING Left 07/25/2020   Procedure: CLOSED REDUCTION FINGER WITH PERCUTANEOUS PINNING;  Surgeon: Murrell Kuba, MD;  Location: [facility];  Service: Orthopedics;  Laterality: Left;  AXILLARY BLOCK   COLONOSCOPY WITH PROPOFOL  N/A 08/19/2023   Procedure: COLONOSCOPY WITH PROPOFOL ;  Surgeon: Cinderella Deatrice FALCON, MD;  Location: [facility];  Service: Endoscopy;  Laterality: N/A;  1:00PM;ASA 3   ECTOPIC PREGNANCY SURGERY     ESOPHAGOGASTRODUODENOSCOPY (EGD) WITH PROPOFOL  N/A 08/19/2023   Procedure: ESOPHAGOGASTRODUODENOSCOPY (EGD) WITH PROPOFOL ;  Surgeon: Cinderella Deatrice FALCON, MD;  [facility]: [facility];  Service: Endoscopy;  Laterality: N/A;   1:00PM;ASA 3   LUMBAR SPINE SURGERY     POLYPECTOMY  08/19/2023   Procedure: POLYPECTOMY;  Surgeon: Cinderella Deatrice FALCON, MD;  Location: [facility];  Service: Endoscopy;;   TONSILLECTOMY AND ADENOIDECTOMY     Patient Active Problem List   Diagnosis Date Noted   Elevated ferritin 11/08/2023   History of colonic polyps 08/31/2023   Adenomatous polyp of sigmoid colon 08/19/2023   Adenomatous polyp of descending colon 08/19/2023   Gastritis and gastroduodenitis 08/19/2023   Hardening of the aorta (main artery of the heart) 08/17/2023   Loss of weight 08/04/2023   Iron deficiency anemia due to chronic blood loss 08/04/2023   Intermittent diarrhea 08/04/2023   Alkaline phosphatase elevation 08/04/2023   Vitamin D  deficiency 05/14/2023   COPD (chronic obstructive pulmonary disease) (HCC) 03/31/2023   Acute hypoxic respiratory failure/COPD 03/31/2023   Anemia of chronic disease 03/29/2023   Pelvic fracture (HCC) 03/27/2023   Leukocytosis 03/27/2023   Depression 03/27/2023   Hypothyroidism 03/27/2023   Seizures (HCC) 03/27/2023   Nicotine dependence 06/17/2022   Tachycardia 06/17/2022   Prediabetes 01/10/2022   Impaired fasting glucose 01/08/2022   Nasal congestion 12/25/2021   Headache 10/29/2021   Otalgia of left ear 08/09/2021   Dizziness and giddiness 08/07/2021   Ataxia 07/10/2021   Gastroesophageal reflux disease without  esophagitis 07/10/2021   Generalized anxiety disorder 07/10/2021   Hypochloremia 07/10/2021   Mixed hyperlipidemia 07/10/2021   Primary insomnia 07/10/2021   Closed displaced fracture of proximal phalanx of left index finger 07/19/2020   Syncope and collapse 03/20/2013   Hypokalemia 03/20/2013   Hypertension     ONSET DATE: 10/03/24 approximate  REFERRING DIAG:  Diagnosis  T14.8XXA (ICD-10-CM) - Other injury of unspecified body region, initial encounter    THERAPY DIAG:  Pain in left ankle and joints of left foot - Plan: PT plan of care  cert/re-cert  Ankle wound, left, sequela - Plan: PT plan of care cert/re-cert  Rationale for Evaluation and Treatment: Rehabilitation     Wound Therapy - 10/25/24 0001     Subjective Pt states that she does not know what she did to her left ankle but there has been a wound on it fro the past three weeks.  She has been placed on antibiotic and given a cream to put on it but it is not getting any better.    Patient and Family Stated Goals ankle wound to heal    Date of Onset 10/03/24   approximate   Prior Treatments self care    Pain Scale 0-10    Pain Score 6    goes up to a 9 with debridement   Pain Type Acute pain    Pain Location Ankle    Pain Orientation Left;Lateral    Pain Descriptors / Indicators Penetrating    Pain Onset Unable to tell    Patients Stated Pain Goal 0    Pain Intervention(s) Distraction    Evaluation and Treatment Procedures Explained to Patient/Family Yes    Evaluation and Treatment Procedures agreed to    Wound Properties Date First Assessed: 10/25/24 Time First Assessed: 1310 Present on Original Admission: Yes Primary Wound Type: Other (Comment) Location: Ankle Location Orientation: Left;Lateral   Site / Wound Assessment Brown    Peri-wound Assessment Erythema (blanchable)    Wound Length (cm) 1 cm   following debridement .7x.7 100% adherent slough   Wound Width (cm) 1 cm    Wound Surface Area (cm^2) 0.79 cm^2    Wound Depth (cm) --   unknown at this time.   Drainage Amount None    Treatments Cleansed;Moisturizing cream;Site care;Other (Comment)   debridement   Dressing Type None    Dressing Changed New    Dressing Status None    Wound Therapy - Clinical Statement see below    Wound Therapy - Functional Problem List difficult to sleep    Factors Delaying/Impairing Wound Healing Infection - systemic/local;Immobility;Multiple medical problems;Polypharmacy    Hydrotherapy Plan Debridement;Dressing change;Patient/family education    Wound Therapy -  Frequency 2X / week    Wound Therapy - Current Recommendations PT    Wound Plan debridement and dressing change as indicated.    Dressing  medihoney, 4x4, 3 kling and netting            PATIENT EDUCATION: Education details: Keep dressing on until Saturday unless it becomes wet.  Keep wound covered at all times  Person educated: Patient Education method: Explanation Education comprehension: verbalized understanding   HOME EXERCISE PROGRAM: none   GOALS: Goals reviewed with patient? Yes  SHORT TERM GOALS: Target date: 11/08/24  PT pain in left ankle to be decreased to no greater than a 2/10 Baseline: Goal status: INITIAL  2.  PT wound to be 100%granulated Baseline:  Goal status: INITIAL   LONG TERM GOALS:  Target date: 12\24\25   PT pain in left ankle to be 0 Baseline:  Goal status: INITIAL  2.  PT Lt ankle wound to be healed.  Baseline:  Goal status: INITIAL  ASSESSMENT:  CLINICAL IMPRESSION: Patient is a 73 y.o. female who was seen today for physical therapy evaluation and treatment for a non healing wound on her left ankle.  The patients wound has no granulation and a high amount of pain.  Catherine Gonzalez will benefit from skilled PT in order to create a healing environment for her wound.    OBJECTIVE IMPAIRMENTS: pain and decreased skin integrity .   ACTIVITY LIMITATIONS: sleeping, bathing, and dressing  PERSONAL FACTORS: Fitness and Time since onset of injury/illness/exacerbation are also affecting patient's functional outcome.   REHAB POTENTIAL: Good  CLINICAL DECISION MAKING: Stable/uncomplicated  EVALUATION COMPLEXITY: Moderate  PLAN: PT FREQUENCY: 2x/week  PT DURATION: 4 weeks  PLANNED INTERVENTIONS: 97535- Self Care, 02402- Wound care (first 20 sq cm), and Patient/Family education  PLAN FOR NEXT SESSION: debridement and appropriate dressing.   Montie Metro, PT CLT [phone]  10/25/2024, 2:02 PM  [employer] Auth Request Treatment  Start Date: 10/25/24  Referring diagnosis code (ICD 10)? Diagnosis  T14.8XXA (ICD-10-CM) - Other injury of unspecified body region, initial encounter   Treatment diagnosis codes (ICD 10)? (if different than referring diagnosis) M25.572; S91.002S What was this (referring dx) caused by? []  Surgery []  Fall []  Ongoing issue []  Arthritis [x]  Other: _unknown___________  Laterality: []  Rt [x]  Lt []  Both  Deficits: [x]  Pain []  Stiffness []  Weakness [x]  Edema []  Balance Deficits []  Coordination []  Gait Disturbance []  ROM [x]  Other skin integrity     CPT codes: See Planned Interventions listed in the Plan section of the Evaluation.

## 2024-10-31 ENCOUNTER — Ambulatory Visit (HOSPITAL_COMMUNITY): Admitting: Physical Therapy

## 2024-10-31 DIAGNOSIS — M25572 Pain in left ankle and joints of left foot: Secondary | ICD-10-CM | POA: Insufficient documentation

## 2024-10-31 DIAGNOSIS — S91002S Unspecified open wound, left ankle, sequela: Secondary | ICD-10-CM | POA: Diagnosis present

## 2024-10-31 NOTE — Therapy (Signed)
 OUTPATIENT PHYSICAL THERAPY Wound  Treatment   Patient Name: Catherine Gonzalez MRN: [id number] DOB:09/27/1951, 73 y.o., female Today's Date: 10/31/2024   PCP: Norleen Hurst REFERRING PROVIDER: Joeann Browning, FNP  END OF SESSION:  PT End of Session - 10/31/24 1331     Visit Number 2    Number of Visits 8    Date for Recertification  11/22/24    Authorization Type [employer]    Authorization Time Period 11/26-2/24/26    Authorization - Visit Number 2    Authorization - Number of Visits 8    Progress Note Due on Visit 8    PT Start Time 1303    PT Stop Time 1331    PT Time Calculation (min) 28 min    Activity Tolerance Patient limited by pain    Behavior During Therapy WFL for tasks assessed/performed           Past Medical History:  Diagnosis Date   Allergic rhinitis    Anemia    Anxiety    Blood transfusion without reported diagnosis    Depression    Ectopic pregnancy    Finger fracture, left    index   GERD (gastroesophageal reflux disease)    Hepatitis C    Hypertension    Hypothyroidism    Seizures (HCC)    Past Surgical History:  Procedure Laterality Date   APPENDECTOMY     BIOPSY  08/19/2023   Procedure: BIOPSY;  Surgeon: Cinderella Deatrice FALCON, MD;  Location: [facility];  Service: Endoscopy;;   BRAIN SURGERY     x 3   BREAST ENHANCEMENT SURGERY     CHOLECYSTECTOMY     CLOSED REDUCTION FINGER WITH PERCUTANEOUS PINNING Left 07/25/2020   Procedure: CLOSED REDUCTION FINGER WITH PERCUTANEOUS PINNING;  Surgeon: Murrell Kuba, MD;  Location: [facility];  Service: Orthopedics;  Laterality: Left;  AXILLARY BLOCK   COLONOSCOPY WITH PROPOFOL  N/A 08/19/2023   Procedure: COLONOSCOPY WITH PROPOFOL ;  Surgeon: Cinderella Deatrice FALCON, MD;  Location: [facility];  Service: Endoscopy;  Laterality: N/A;  1:00PM;ASA 3   ECTOPIC PREGNANCY SURGERY     ESOPHAGOGASTRODUODENOSCOPY (EGD) WITH PROPOFOL  N/A 08/19/2023   Procedure: ESOPHAGOGASTRODUODENOSCOPY (EGD) WITH  PROPOFOL ;  Surgeon: Cinderella Deatrice FALCON, MD;  Location: [facility];  Service: Endoscopy;  Laterality: N/A;  1:00PM;ASA 3   LUMBAR SPINE SURGERY     POLYPECTOMY  08/19/2023   Procedure: POLYPECTOMY;  Surgeon: Cinderella Deatrice FALCON, MD;  Location: [facility];  Service: Endoscopy;;   TONSILLECTOMY AND ADENOIDECTOMY     Patient Active Problem List   Diagnosis Date Noted   Elevated ferritin 11/08/2023   History of colonic polyps 08/31/2023   Adenomatous polyp of sigmoid colon 08/19/2023   Adenomatous polyp of descending colon 08/19/2023   Gastritis and gastroduodenitis 08/19/2023   Hardening of the aorta (main artery of the heart) 08/17/2023   Loss of weight 08/04/2023   Iron deficiency anemia due to chronic blood loss 08/04/2023   Intermittent diarrhea 08/04/2023   Alkaline phosphatase elevation 08/04/2023   Vitamin D  deficiency 05/14/2023   COPD (chronic obstructive pulmonary disease) (HCC) 03/31/2023   Acute hypoxic respiratory failure/COPD 03/31/2023   Anemia of chronic disease 03/29/2023   Pelvic fracture (HCC) 03/27/2023   Leukocytosis 03/27/2023   Depression 03/27/2023   Hypothyroidism 03/27/2023   Seizures (HCC) 03/27/2023   Nicotine dependence 06/17/2022   Tachycardia 06/17/2022   Prediabetes 01/10/2022   Impaired fasting glucose 01/08/2022   Nasal congestion 12/25/2021  Headache 10/29/2021   Otalgia of left ear 08/09/2021   Dizziness and giddiness 08/07/2021   Ataxia 07/10/2021   Gastroesophageal reflux disease without esophagitis 07/10/2021   Generalized anxiety disorder 07/10/2021   Hypochloremia 07/10/2021   Mixed hyperlipidemia 07/10/2021   Primary insomnia 07/10/2021   Closed displaced fracture of proximal phalanx of left index finger 07/19/2020   Syncope and collapse 03/20/2013   Hypokalemia 03/20/2013   Hypertension     ONSET DATE: 10/03/24 approximate  REFERRING DIAG:  Diagnosis  T14.8XXA (ICD-10-CM) - Other injury of unspecified body region, initial  encounter    THERAPY DIAG:  Pain in left ankle and joints of left foot  Ankle wound, left, sequela  Rationale for Evaluation and Treatment: Rehabilitation     Wound Therapy - 10/31/24 0001     Subjective Pt states she continues to have a high amount of pain.    Patient and Family Stated Goals ankle wound to heal    Date of Onset 10/03/24   approximate   Prior Treatments self care    Pain Scale 0-10    Pain Score 7     Pain Type Acute pain    Pain Location Ankle    Pain Orientation Left;Lateral    Evaluation and Treatment Procedures Explained to Patient/Family Yes    Evaluation and Treatment Procedures agreed to    Wound Properties Date First Assessed: 10/25/24 Time First Assessed: 1310 Present on Original Admission: Yes Primary Wound Type: Other (Comment) Location: Ankle Location Orientation: Left;Lateral   Site / Wound Assessment Brown;Yellow    Peri-wound Assessment Erythema (blanchable)    Wound Length (cm) 0.5 cm    Wound Width (cm) 0.5 cm    Wound Surface Area (cm^2) 0.2 cm^2    Wound Depth (cm) --   unknown as there is no granulatin   Drainage Amount None    Treatments Cleansed;Irrigation;Site care;Other (Comment)   pulse lavage followed by debridement with scalpel, forceps and scissors.   Dressing Type None    Dressing Changed New    Dressing Status None    Wound Therapy - Clinical Statement see below    Wound Therapy - Functional Problem List difficult to sleep    Factors Delaying/Impairing Wound Healing Infection - systemic/local;Immobility;Multiple medical problems;Polypharmacy    Hydrotherapy Plan Debridement;Dressing change;Patient/family education    Wound Therapy - Frequency 2X / week    Wound Therapy - Current Recommendations PT    Wound Plan debridement and dressing change as indicated.    Dressing  medihoney, 4x4, 3 kling and netting             PATIENT EDUCATION: Education details: Keep dressing on until Saturday unless it becomes wet.  Keep  wound covered at all times  Person educated: Patient Education method: Explanation Education comprehension: verbalized understanding   HOME EXERCISE PROGRAM: none   GOALS: Goals reviewed with patient? Yes  SHORT TERM GOALS: Target date: 11/08/24  PT pain in left ankle to be decreased to no greater than a 2/10 Baseline: Goal status: IN PROGRESS  2.  PT wound to be 100%granulated Baseline:  Goal status: IN PROGRESS   LONG TERM GOALS: Target date: 12\24\25   PT pain in left ankle to be 0 Baseline:  Goal status: IN PROGRESS  2.  PT Lt ankle wound to be healed.  Baseline:  Goal status: IN PROGRESS  ASSESSMENT:  CLINICAL IMPRESSION: PT comes to the department with wound uncovered.  Therapist stressed that we want the wound to be  moist and covered at all times.  Pt wound filled with yellow adherent slough that is very painful to the touch, therefore therapist used pulse lavage to loosen slough prior to debridement.    Ms. Eckman will continue to benefit from skilled PT in order to create a healing environment for her wound.    OBJECTIVE IMPAIRMENTS: pain and decreased skin integrity .   ACTIVITY LIMITATIONS: sleeping, bathing, and dressing  PERSONAL FACTORS: Fitness and Time since onset of injury/illness/exacerbation are also affecting patient's functional outcome.   REHAB POTENTIAL: Good  CLINICAL DECISION MAKING: Stable/uncomplicated  EVALUATION COMPLEXITY: Moderate  PLAN: PT FREQUENCY: 2x/week  PT DURATION: 4 weeks  PLANNED INTERVENTIONS: 97535- Self Care, 02402- Wound care (first 20 sq cm), and Patient/Family education  PLAN FOR NEXT SESSION: Continue with debridement and appropriate dressing.   Montie Metro, PT CLT [phone]  10/31/2024, 1:35 PM

## 2024-11-03 ENCOUNTER — Ambulatory Visit (HOSPITAL_COMMUNITY): Admitting: Physical Therapy

## 2024-11-07 ENCOUNTER — Ambulatory Visit (HOSPITAL_COMMUNITY): Admitting: Physical Therapy

## 2024-11-07 ENCOUNTER — Ambulatory Visit (INDEPENDENT_AMBULATORY_CARE_PROVIDER_SITE_OTHER): Payer: Medicare Other | Admitting: Gastroenterology

## 2024-11-08 ENCOUNTER — Encounter (INDEPENDENT_AMBULATORY_CARE_PROVIDER_SITE_OTHER): Payer: Self-pay | Admitting: Gastroenterology

## 2024-11-09 ENCOUNTER — Ambulatory Visit (HOSPITAL_COMMUNITY): Admitting: Physical Therapy

## 2024-11-10 ENCOUNTER — Ambulatory Visit (HOSPITAL_COMMUNITY): Admitting: Physical Therapy

## 2024-11-10 DIAGNOSIS — S91002S Unspecified open wound, left ankle, sequela: Secondary | ICD-10-CM

## 2024-11-10 DIAGNOSIS — M25572 Pain in left ankle and joints of left foot: Secondary | ICD-10-CM

## 2024-11-10 NOTE — Therapy (Signed)
 OUTPATIENT PHYSICAL THERAPY Wound  Treatment   Patient Name: Catherine Gonzalez MRN: [id number] DOB:01-08-1951, 73 y.o., female Today's Date: 11/10/2024   PCP: Norleen Hurst REFERRING PROVIDER: Joeann Browning, FNP  END OF SESSION:  PT End of Session - 11/10/24 1328     Visit Number 3    Number of Visits 8    Date for Recertification  11/22/24    Authorization Type [employer]    Authorization Time Period 11/26-2/24/26    Authorization - Visit Number 3    Authorization - Number of Visits 8    Progress Note Due on Visit 8    PT Start Time 1255    PT Stop Time 1325    PT Time Calculation (min) 30 min    Activity Tolerance Patient limited by pain    Behavior During Therapy WFL for tasks assessed/performed           Past Medical History:  Diagnosis Date   Allergic rhinitis    Anemia    Anxiety    Blood transfusion without reported diagnosis    Depression    Ectopic pregnancy    Finger fracture, left    index   GERD (gastroesophageal reflux disease)    Hepatitis C    Hypertension    Hypothyroidism    Seizures (HCC)    Past Surgical History:  Procedure Laterality Date   APPENDECTOMY     BIOPSY  08/19/2023   Procedure: BIOPSY;  Surgeon: Cinderella Deatrice FALCON, MD;  Location: [facility];  Service: Endoscopy;;   BRAIN SURGERY     x 3   BREAST ENHANCEMENT SURGERY     CHOLECYSTECTOMY     CLOSED REDUCTION FINGER WITH PERCUTANEOUS PINNING Left 07/25/2020   Procedure: CLOSED REDUCTION FINGER WITH PERCUTANEOUS PINNING;  Surgeon: Murrell Kuba, MD;  Location: [facility];  Service: Orthopedics;  Laterality: Left;  AXILLARY BLOCK   COLONOSCOPY WITH PROPOFOL  N/A 08/19/2023   Procedure: COLONOSCOPY WITH PROPOFOL ;  Surgeon: Cinderella Deatrice FALCON, MD;  [facility]: [facility];  Service: Endoscopy;  Laterality: N/A;  1:00PM;ASA 3   ECTOPIC PREGNANCY SURGERY     ESOPHAGOGASTRODUODENOSCOPY (EGD) WITH PROPOFOL  N/A 08/19/2023   Procedure: ESOPHAGOGASTRODUODENOSCOPY (EGD) WITH  PROPOFOL ;  Surgeon: Cinderella Deatrice FALCON, MD;  [facility]: [facility];  Service: Endoscopy;  Laterality: N/A;  1:00PM;ASA 3   LUMBAR SPINE SURGERY     POLYPECTOMY  08/19/2023   Procedure: POLYPECTOMY;  Surgeon: Cinderella Deatrice FALCON, MD;  [facility]: [facility];  Service: Endoscopy;;   TONSILLECTOMY AND ADENOIDECTOMY     Patient Active Problem List   Diagnosis Date Noted   Elevated ferritin 11/08/2023   History of colonic polyps 08/31/2023   Adenomatous polyp of sigmoid colon 08/19/2023   Adenomatous polyp of descending colon 08/19/2023   Gastritis and gastroduodenitis 08/19/2023   Hardening of the aorta (main artery of the heart) 08/17/2023   Loss of weight 08/04/2023   Iron deficiency anemia due to chronic blood loss 08/04/2023   Intermittent diarrhea 08/04/2023   Alkaline phosphatase elevation 08/04/2023   Vitamin D  deficiency 05/14/2023   COPD (chronic obstructive pulmonary disease) (HCC) 03/31/2023   Acute hypoxic respiratory failure/COPD 03/31/2023   Anemia of chronic disease 03/29/2023   Pelvic fracture (HCC) 03/27/2023   Leukocytosis 03/27/2023   Depression 03/27/2023   Hypothyroidism 03/27/2023   Seizures (HCC) 03/27/2023   Nicotine dependence 06/17/2022   Tachycardia 06/17/2022   Prediabetes 01/10/2022   Impaired fasting glucose 01/08/2022   Nasal congestion 12/25/2021  Headache 10/29/2021   Otalgia of left ear 08/09/2021   Dizziness and giddiness 08/07/2021   Ataxia 07/10/2021   Gastroesophageal reflux disease without esophagitis 07/10/2021   Generalized anxiety disorder 07/10/2021   Hypochloremia 07/10/2021   Mixed hyperlipidemia 07/10/2021   Primary insomnia 07/10/2021   Closed displaced fracture of proximal phalanx of left index finger 07/19/2020   Syncope and collapse 03/20/2013   Hypokalemia 03/20/2013   Hypertension     ONSET DATE: 10/03/24 approximate  REFERRING DIAG:  Diagnosis  T14.8XXA (ICD-10-CM) - Other injury of unspecified body region, initial  encounter    THERAPY DIAG:  Pain in left ankle and joints of left foot  Ankle wound, left, sequela  Rationale for Evaluation and Treatment: Rehabilitation     Wound Therapy - 11/10/24 0001     Subjective Pt states she can not stand anything on he ankles    Patient and Family Stated Goals ankle wound to heal    Date of Onset 10/03/24   approximate   Prior Treatments self care    Pain Scale 0-10    Pain Score 7     Pain Type Acute pain    Pain Location Ankle    Pain Orientation Left;Lateral    Evaluation and Treatment Procedures Explained to Patient/Family Yes    Evaluation and Treatment Procedures agreed to    Wound Properties Date First Assessed: 10/25/24 Time First Assessed: 1310 Present on Original Admission: Yes Primary Wound Type: Other (Comment) Location: Ankle Location Orientation: Left;Lateral   Site / Wound Assessment Yellow    Peri-wound Assessment Erythema (blanchable)    Wound Length (cm) 0.5 cm    Wound Width (cm) 0.5 cm    Wound Surface Area (cm^2) 0.2 cm^2    Wound Depth (cm) --   .3   Drainage Amount None    Treatments Cleansed;Moisturizing cream;Site care   pulse lavage followed by debridement of wound bed with forceps, sissors and scalpel. Pt now has 20% granulation   Dressing Type None    Dressing Changed New    Dressing Status None    Wound Therapy - Clinical Statement see below    Wound Therapy - Functional Problem List difficult to sleep    Factors Delaying/Impairing Wound Healing Infection - systemic/local;Immobility;Multiple medical problems;Polypharmacy    Hydrotherapy Plan Debridement;Dressing change;Patient/family education    Wound Therapy - Frequency 2X / week    Wound Therapy - Current Recommendations PT    Wound Plan debridement and dressing change as indicated.    Dressing  medihoney, 4x4, 3 kling and netting             PATIENT EDUCATION: Education details: Keep dressing on until Saturday unless it becomes wet.  Keep wound covered  at all times  Person educated: Patient Education method: Explanation Education comprehension: verbalized understanding   HOME EXERCISE PROGRAM: none   GOALS: Goals reviewed with patient? Yes  SHORT TERM GOALS: Target date: 11/08/24  PT pain in left ankle to be decreased to no greater than a 2/10 Baseline: Goal status: IN PROGRESS  2.  PT wound to be 100%granulated Baseline:  Goal status: IN PROGRESS   LONG TERM GOALS: Target date: 12\24\25   PT pain in left ankle to be 0 Baseline:  Goal status: IN PROGRESS  2.  PT Lt ankle wound to be healed.  Baseline:  Goal status: IN PROGRESS  ASSESSMENT:  CLINICAL IMPRESSION: PT comes to the department with wound uncovered once again.  Therapist stressed once again that  we want the wound to be moist and covered at all times.  Pt wound filled with yellow adherent slough that is very painful to debridement, therefore therapist used pulse lavage to loosen slough prior to debridement.   Wound had no granulation prior to treatment, 20% granulation following treatment.   Ms. Frieze will continue to benefit from skilled PT in order to create a healing environment for her wound.    OBJECTIVE IMPAIRMENTS: pain and decreased skin integrity .   ACTIVITY LIMITATIONS: sleeping, bathing, and dressing  PERSONAL FACTORS: Fitness and Time since onset of injury/illness/exacerbation are also affecting patient's functional outcome.   REHAB POTENTIAL: Good  CLINICAL DECISION MAKING: Stable/uncomplicated  EVALUATION COMPLEXITY: Moderate  PLAN: PT FREQUENCY: 2x/week  PT DURATION: 4 weeks  PLANNED INTERVENTIONS: 97535- Self Care, 02402- Wound care (first 20 sq cm), and Patient/Family education  PLAN FOR NEXT SESSION: Continue with debridement and appropriate dressing.   Montie Metro, PT CLT [phone]  11/10/2024, 1:34 PM

## 2024-11-15 ENCOUNTER — Ambulatory Visit (HOSPITAL_COMMUNITY): Admitting: Physical Therapy

## 2024-11-15 DIAGNOSIS — S91002S Unspecified open wound, left ankle, sequela: Secondary | ICD-10-CM

## 2024-11-15 DIAGNOSIS — M25572 Pain in left ankle and joints of left foot: Secondary | ICD-10-CM

## 2024-11-15 NOTE — Therapy (Signed)
 OUTPATIENT PHYSICAL THERAPY Wound  Treatment   Patient Name: Catherine Gonzalez MRN: [id number] DOB:[birth year], 73 y.o., female Today's Date: 11/15/2024   PCP: Norleen Hurst REFERRING PROVIDER: Joeann Browning, FNP  END OF SESSION:  PT End of Session - 11/15/24 1019     Visit Number 4    Number of Visits 8    Date for Recertification  11/22/24    Authorization Type [employer]    Authorization Time Period 11/26-2/24/26    Authorization - Visit Number 4    Authorization - Number of Visits 8    Progress Note Due on Visit 8    PT Start Time 0950    PT Stop Time 1014    PT Time Calculation (min) 24 min    Activity Tolerance Patient limited by pain    Behavior During Therapy WFL for tasks assessed/performed           Past Medical History:  Diagnosis Date   Allergic rhinitis    Anemia    Anxiety    Blood transfusion without reported diagnosis    Depression    Ectopic pregnancy    Finger fracture, left    index   GERD (gastroesophageal reflux disease)    Hepatitis C    Hypertension    Hypothyroidism    Seizures (HCC)    Past Surgical History:  Procedure Laterality Date   APPENDECTOMY     BIOPSY  08/19/2023   Procedure: BIOPSY;  Surgeon: Cinderella Deatrice FALCON, MD;  [facility];  Service: Endoscopy;;   BRAIN SURGERY     x 3   BREAST ENHANCEMENT SURGERY     CHOLECYSTECTOMY     CLOSED REDUCTION FINGER WITH PERCUTANEOUS PINNING Left 07/25/2020   Procedure: CLOSED REDUCTION FINGER WITH PERCUTANEOUS PINNING;  Surgeon: Murrell Kuba, MD;  Location: [facility];  Service: Orthopedics;  Laterality: Left;  AXILLARY BLOCK   COLONOSCOPY WITH PROPOFOL  N/A 08/19/2023   Procedure: COLONOSCOPY WITH PROPOFOL ;  Surgeon: Cinderella Deatrice FALCON, MD;  [facility]: [facility];  Service: Endoscopy;  Laterality: N/A;  1:00PM;ASA 3   ECTOPIC PREGNANCY SURGERY     ESOPHAGOGASTRODUODENOSCOPY (EGD) WITH PROPOFOL  N/A 08/19/2023   Procedure: ESOPHAGOGASTRODUODENOSCOPY (EGD) WITH  PROPOFOL ;  Surgeon: Cinderella Deatrice FALCON, MD;  [facility];  Service: Endoscopy;  Laterality: N/A;  1:00PM;ASA 3   LUMBAR SPINE SURGERY     POLYPECTOMY  08/19/2023   Procedure: POLYPECTOMY;  Surgeon: Cinderella Deatrice FALCON, MD;  [facility];  Service: Endoscopy;;   TONSILLECTOMY AND ADENOIDECTOMY     Patient Active Problem List   Diagnosis Date Noted   Elevated ferritin 11/08/2023   History of colonic polyps 08/31/2023   Adenomatous polyp of sigmoid colon 08/19/2023   Adenomatous polyp of descending colon 08/19/2023   Gastritis and gastroduodenitis 08/19/2023   Hardening of the aorta (main artery of the heart) 08/17/2023   Loss of weight 08/04/2023   Iron deficiency anemia due to chronic blood loss 08/04/2023   Intermittent diarrhea 08/04/2023   Alkaline phosphatase elevation 08/04/2023   Vitamin D  deficiency 05/14/2023   COPD (chronic obstructive pulmonary disease) (HCC) 03/31/2023   Acute hypoxic respiratory failure/COPD 03/31/2023   Anemia of chronic disease 03/29/2023   Pelvic fracture (HCC) 03/27/2023   Leukocytosis 03/27/2023   Depression 03/27/2023   Hypothyroidism 03/27/2023   Seizures (HCC) 03/27/2023   Nicotine dependence 06/17/2022   Tachycardia 06/17/2022   Prediabetes 01/10/2022   Impaired fasting glucose 01/08/2022   Nasal congestion 12/25/2021  Headache 10/29/2021   Otalgia of left ear 08/09/2021   Dizziness and giddiness 08/07/2021   Ataxia 07/10/2021   Gastroesophageal reflux disease without esophagitis 07/10/2021   Generalized anxiety disorder 07/10/2021   Hypochloremia 07/10/2021   Mixed hyperlipidemia 07/10/2021   Primary insomnia 07/10/2021   Closed displaced fracture of proximal phalanx of left index finger 07/19/2020   Syncope and collapse 03/20/2013   Hypokalemia 03/20/2013   Hypertension     ONSET DATE: 10/03/24 approximate  REFERRING DIAG:  Diagnosis  T14.8XXA (ICD-10-CM) - Other injury of unspecified body region, initial  encounter    THERAPY DIAG:  Pain in left ankle and joints of left foot  Ankle wound, left, sequela  Rationale for Evaluation and Treatment: Rehabilitation     Wound Therapy - 11/15/24 1020     Subjective pt states she kept a bandaid on it.  reports she's been sleeping on that side.    Patient and Family Stated Goals ankle wound to heal    Date of Onset 10/03/24   approximate   Prior Treatments self care    Evaluation and Treatment Procedures Explained to Patient/Family Yes    Evaluation and Treatment Procedures agreed to    Wound Properties Date First Assessed: 10/25/24 Time First Assessed: 1310 Present on Original Admission: Yes Primary Wound Type: Other (Comment) Location: Ankle Location Orientation: Left;Lateral   Wound Image Images linked: 1    Site / Wound Assessment Yellow    Peri-wound Assessment Erythema (blanchable)    Wound Length (cm) 0.3 cm    Wound Width (cm) 0.3 cm    Wound Surface Area (cm^2) 0.07 cm^2    Wound Depth (cm) 0.2 cm    Wound Volume (cm^3) 0.009 cm^3    Drainage Amount Scant    Treatments Cleansed   debridement   Dressing Type None    Dressing Changed Changed    Dressing Status None    Wound Therapy - Clinical Statement see below    Wound Therapy - Functional Problem List difficult to sleep    Factors Delaying/Impairing Wound Healing Infection - systemic/local;Immobility;Multiple medical problems;Polypharmacy    Hydrotherapy Plan Debridement;Dressing change;Patient/family education    Wound Therapy - Frequency 2X / week    Wound Therapy - Current Recommendations PT    Wound Plan debridement and dressing change as indicated.    Dressing  small piece of medhoney gauze packed into wound, 2X2, medipore tape             PATIENT EDUCATION: Education details: Keep dressing on until Saturday unless it becomes wet.  Keep wound covered at all times  Person educated: Patient Education method: Explanation Education comprehension: verbalized  understanding   HOME EXERCISE PROGRAM: none   GOALS: Goals reviewed with patient? Yes  SHORT TERM GOALS: Target date: 11/08/24  PT pain in left ankle to be decreased to no greater than a 2/10 Baseline: Goal status: IN PROGRESS  2.  PT wound to be 100%granulated Baseline:  Goal status: IN PROGRESS   LONG TERM GOALS: Target date: 12\24\25   PT pain in left ankle to be 0 Baseline:  Goal status: IN PROGRESS  2.  PT Lt ankle wound to be healed.  Baseline:  Goal status: IN PROGRESS  ASSESSMENT:  CLINICAL IMPRESSION: Wound covered this session with a knuckle bandaid.  Some redness around perimeter of wound but no indication of infection.  Cleansed and debrided slough.  Still heavily covered in adherent slough, however is smaller today according to measurements taken.  Wound also photographed in media.  Packed small piece of honey infused gauze into wound and covered with 2X2.  Medipore tape used to secure wound.  Instructed pt not to remove dressing and allow us  only to change it to keep infection down.  Educated on pressure wounds and how they start, instructing her to not sleep on her Lt side and keep direct pressure absent from her lateral ankle.  Pt verbalized understanding. Pt tolerated debridement well this session.    OBJECTIVE IMPAIRMENTS: pain and decreased skin integrity .   ACTIVITY LIMITATIONS: sleeping, bathing, and dressing  PERSONAL FACTORS: Fitness and Time since onset of injury/illness/exacerbation are also affecting patient's functional outcome.   REHAB POTENTIAL: Good  CLINICAL DECISION MAKING: Stable/uncomplicated  EVALUATION COMPLEXITY: Moderate  PLAN: PT FREQUENCY: 2x/week  PT DURATION: 4 weeks  PLANNED INTERVENTIONS: 97535- Self Care, 02402- Wound care (first 20 sq cm), and Patient/Family education  PLAN FOR NEXT SESSION: Continue with debridement and appropriate dressing.   Weekly measurements and photographs   Greig KATHEE Fuse, PTA/CLT [facility] Ph: [phone]  11/15/2024, 10:23 AM

## 2024-11-21 ENCOUNTER — Ambulatory Visit (HOSPITAL_COMMUNITY)

## 2024-11-21 ENCOUNTER — Encounter (HOSPITAL_COMMUNITY): Payer: Self-pay

## 2024-11-21 DIAGNOSIS — M25572 Pain in left ankle and joints of left foot: Secondary | ICD-10-CM

## 2024-11-21 DIAGNOSIS — S91002S Unspecified open wound, left ankle, sequela: Secondary | ICD-10-CM

## 2024-11-21 NOTE — Therapy (Signed)
 " OUTPATIENT PHYSICAL THERAPY Wound  Treatment   Patient Name: Catherine Gonzalez MRN: [id number] DOB:October 03, 1951, 73 y.o., female Today's Date: 11/21/2024   PCP: Norleen Hurst REFERRING PROVIDER: Joeann Browning, FNP  END OF SESSION:  PT End of Session - 11/21/24 1441     Visit Number 5    Number of Visits 8    Date for Recertification  11/22/24    Authorization Type [employer]    Authorization Time Period 11/26-2/24/26    Authorization - Visit Number 5    Authorization - Number of Visits 8    Progress Note Due on Visit 8    PT Start Time 1405    PT Stop Time 1430    PT Time Calculation (min) 25 min    Activity Tolerance Patient limited by pain    Behavior During Therapy WFL for tasks assessed/performed           Past Medical History:  Diagnosis Date   Allergic rhinitis    Anemia    Anxiety    Blood transfusion without reported diagnosis    Depression    Ectopic pregnancy    Finger fracture, left    index   GERD (gastroesophageal reflux disease)    Hepatitis C    Hypertension    Hypothyroidism    Seizures (HCC)    Past Surgical History:  Procedure Laterality Date   APPENDECTOMY     BIOPSY  08/19/2023   Procedure: BIOPSY;  Surgeon: Cinderella Deatrice FALCON, MD;  [facility];  Service: Endoscopy;;   BRAIN SURGERY     x 3   BREAST ENHANCEMENT SURGERY     CHOLECYSTECTOMY     CLOSED REDUCTION FINGER WITH PERCUTANEOUS PINNING Left 07/25/2020   Procedure: CLOSED REDUCTION FINGER WITH PERCUTANEOUS PINNING;  Surgeon: Murrell Kuba, MD;  Location: [facility];  Service: Orthopedics;  Laterality: Left;  AXILLARY BLOCK   COLONOSCOPY WITH PROPOFOL  N/A 08/19/2023   Procedure: COLONOSCOPY WITH PROPOFOL ;  Surgeon: Cinderella Deatrice FALCON, MD;  [facility];  Service: Endoscopy;  Laterality: N/A;  1:00PM;ASA 3   ECTOPIC PREGNANCY SURGERY     ESOPHAGOGASTRODUODENOSCOPY (EGD) WITH PROPOFOL  N/A 08/19/2023   Procedure: ESOPHAGOGASTRODUODENOSCOPY (EGD) WITH  PROPOFOL ;  Surgeon: Cinderella Deatrice FALCON, MD;  [facility];  Service: Endoscopy;  Laterality: N/A;  1:00PM;ASA 3   LUMBAR SPINE SURGERY     POLYPECTOMY  08/19/2023   Procedure: POLYPECTOMY;  Surgeon: Cinderella Deatrice FALCON, MD;  [facility];  Service: Endoscopy;;   TONSILLECTOMY AND ADENOIDECTOMY     Patient Active Problem List   Diagnosis Date Noted   Elevated ferritin 11/08/2023   History of colonic polyps 08/31/2023   Adenomatous polyp of sigmoid colon 08/19/2023   Adenomatous polyp of descending colon 08/19/2023   Gastritis and gastroduodenitis 08/19/2023   Hardening of the aorta (main artery of the heart) 08/17/2023   Loss of weight 08/04/2023   Iron deficiency anemia due to chronic blood loss 08/04/2023   Intermittent diarrhea 08/04/2023   Alkaline phosphatase elevation 08/04/2023   Vitamin D  deficiency 05/14/2023   COPD (chronic obstructive pulmonary disease) (HCC) 03/31/2023   Acute hypoxic respiratory failure/COPD 03/31/2023   Anemia of chronic disease 03/29/2023   Pelvic fracture (HCC) 03/27/2023   Leukocytosis 03/27/2023   Depression 03/27/2023   Hypothyroidism 03/27/2023   Seizures (HCC) 03/27/2023   Nicotine dependence 06/17/2022   Tachycardia 06/17/2022   Prediabetes 01/10/2022   Impaired fasting glucose 01/08/2022   Nasal congestion 12/25/2021  Headache 10/29/2021   Otalgia of left ear 08/09/2021   Dizziness and giddiness 08/07/2021   Ataxia 07/10/2021   Gastroesophageal reflux disease without esophagitis 07/10/2021   Generalized anxiety disorder 07/10/2021   Hypochloremia 07/10/2021   Mixed hyperlipidemia 07/10/2021   Primary insomnia 07/10/2021   Closed displaced fracture of proximal phalanx of left index finger 07/19/2020   Syncope and collapse 03/20/2013   Hypokalemia 03/20/2013   Hypertension     ONSET DATE: 10/03/24 approximate  REFERRING DIAG:  Diagnosis  T14.8XXA (ICD-10-CM) - Other injury of unspecified body region, initial  encounter    THERAPY DIAG:  Pain in left ankle and joints of left foot  Ankle wound, left, sequela  Rationale for Evaluation and Treatment: Rehabilitation     Wound Therapy - 11/21/24 0001     Subjective Pt stated she tried hard to keep dressings on, arrived with extra tape over medipore tape.    Patient and Family Stated Goals ankle wound to heal    Date of Onset 10/03/24   approximate   Prior Treatments self care    Evaluation and Treatment Procedures Explained to Patient/Family Yes    Evaluation and Treatment Procedures agreed to    Wound Properties Date First Assessed: 10/25/24 Time First Assessed: 1310 Present on Original Admission: Yes Primary Wound Type: Other (Comment) Location: Ankle Location Orientation: Left;Lateral   Wound Image Images linked: 1    Site / Wound Assessment Yellow    Peri-wound Assessment Erythema (blanchable)    Wound Length (cm) 0.3 cm    Wound Width (cm) 0.3 cm    Wound Surface Area (cm^2) 0.07 cm^2    Wound Depth (cm) --   unknown   Drainage Amount Scant    Treatments Cleansed;Irrigation;Moisturizing cream   Cleansed, debridement, dressing change   Dressing Type Gauze (Comment)   medihoney, vaseline perimeter, 2x2, kerlix wiht liner   Dressing Changed Changed    Dressing Status Old drainage    Wound Therapy - Clinical Statement see below    Wound Therapy - Functional Problem List difficult to sleep    Factors Delaying/Impairing Wound Healing Infection - systemic/local;Immobility;Multiple medical problems;Polypharmacy    Hydrotherapy Plan Debridement;Dressing change;Patient/family education    Wound Therapy - Frequency 2X / week    Wound Therapy - Current Recommendations PT    Wound Plan debridement and dressing change as indicated.    Dressing  medihoney, vaseline perimeter, 2x2, kerlix wiht liner             PATIENT EDUCATION: Education details: Keep dressing on until Saturday unless it becomes wet.  Keep wound covered at all times   Person educated: Patient Education method: Explanation Education comprehension: verbalized understanding   HOME EXERCISE PROGRAM: none   GOALS: Goals reviewed with patient? Yes  SHORT TERM GOALS: Target date: 11/08/24  PT pain in left ankle to be decreased to no greater than a 2/10 Baseline: Goal status: IN PROGRESS  2.  PT wound to be 100%granulated Baseline:  Goal status: IN PROGRESS   LONG TERM GOALS: Target date: 12\24\25   PT pain in left ankle to be 0 Baseline:  Goal status: IN PROGRESS  2.  PT Lt ankle wound to be healed.  Baseline:  Goal status: IN PROGRESS  ASSESSMENT:  CLINICAL IMPRESSION: Reports of difficulty keeping wound dressings on, arrived with additional tape on medipore tape at entrance.  Cleansed foot and selective debridement for removal of slough from wound bed and dry skin perimeter to promote healing.  Wound  bed covered in adherent slough.  Picture taken in media and measurements taken that were same as last session.  Continued with medihoney, 2x2 and changed to kerlix to keep wound covered.  No reports of increased pain at EOS.  OBJECTIVE IMPAIRMENTS: pain and decreased skin integrity .   ACTIVITY LIMITATIONS: sleeping, bathing, and dressing  PERSONAL FACTORS: Fitness and Time since onset of injury/illness/exacerbation are also affecting patient's functional outcome.   REHAB POTENTIAL: Good  CLINICAL DECISION MAKING: Stable/uncomplicated  EVALUATION COMPLEXITY: Moderate  PLAN: PT FREQUENCY: 2x/week  PT DURATION: 4 weeks  PLANNED INTERVENTIONS: 97535- Self Care, 02402- Wound care (first 20 sq cm), and Patient/Family education  PLAN FOR NEXT SESSION: Continue with debridement and appropriate dressing.   Weekly measurements and photographs   Augustin Mclean, LPTA/CLT; [facility] [phone]   11/21/2024, 2:47 PM   "

## 2024-11-28 ENCOUNTER — Ambulatory Visit (HOSPITAL_COMMUNITY): Admitting: Physical Therapy

## 2024-11-28 ENCOUNTER — Encounter (HOSPITAL_COMMUNITY): Payer: Self-pay | Admitting: Physical Therapy

## 2024-11-28 DIAGNOSIS — S91002S Unspecified open wound, left ankle, sequela: Secondary | ICD-10-CM

## 2024-11-28 DIAGNOSIS — M25572 Pain in left ankle and joints of left foot: Secondary | ICD-10-CM

## 2024-11-28 NOTE — Therapy (Addendum)
 " OUTPATIENT PHYSICAL THERAPY Wound  Treatment   Patient Name: Catherine Gonzalez MRN: [id number] DOB:1951-02-26, 73 y.o., female Today's Date: 11/28/2024   PCP: Norleen Hurst REFERRING PROVIDER: Joeann Browning, FNP  END OF SESSION:  PT End of Session - 11/28/24 1444     Visit Number 6    Number of Visits 8    Date for Recertification  11/22/24    Authorization Type [employer]    Authorization Time Period 11/26-2/24/26    Authorization - Visit Number 6    Authorization - Number of Visits 8    Progress Note Due on Visit 8    PT Start Time 1400    PT Stop Time 1438    PT Time Calculation (min) 38 min    Activity Tolerance Patient limited by pain    Behavior During Therapy WFL for tasks assessed/performed           Past Medical History:  Diagnosis Date   Allergic rhinitis    Anemia    Anxiety    Blood transfusion without reported diagnosis    Depression    Ectopic pregnancy    Finger fracture, left    index   GERD (gastroesophageal reflux disease)    Hepatitis C    Hypertension    Hypothyroidism    Seizures (HCC)    Past Surgical History:  Procedure Laterality Date   APPENDECTOMY     BIOPSY  08/19/2023   Procedure: BIOPSY;  Surgeon: Cinderella Deatrice FALCON, MD;  [facility];  Service: Endoscopy;;   BRAIN SURGERY     x 3   BREAST ENHANCEMENT SURGERY     CHOLECYSTECTOMY     CLOSED REDUCTION FINGER WITH PERCUTANEOUS PINNING Left 07/25/2020   Procedure: CLOSED REDUCTION FINGER WITH PERCUTANEOUS PINNING;  Surgeon: Murrell Kuba, MD;  Location: [facility];  Service: Orthopedics;  Laterality: Left;  AXILLARY BLOCK   COLONOSCOPY WITH PROPOFOL  N/A 08/19/2023   Procedure: COLONOSCOPY WITH PROPOFOL ;  Surgeon: Cinderella Deatrice FALCON, MD;  [facility];  Service: Endoscopy;  Laterality: N/A;  1:00PM;ASA 3   ECTOPIC PREGNANCY SURGERY     ESOPHAGOGASTRODUODENOSCOPY (EGD) WITH PROPOFOL  N/A 08/19/2023   Procedure: ESOPHAGOGASTRODUODENOSCOPY (EGD) WITH  PROPOFOL ;  Surgeon: Cinderella Deatrice FALCON, MD;  [facility];  Service: Endoscopy;  Laterality: N/A;  1:00PM;ASA 3   LUMBAR SPINE SURGERY     POLYPECTOMY  08/19/2023   Procedure: POLYPECTOMY;  Surgeon: Cinderella Deatrice FALCON, MD;  [facility];  Service: Endoscopy;;   TONSILLECTOMY AND ADENOIDECTOMY     Patient Active Problem List   Diagnosis Date Noted   Elevated ferritin 11/08/2023   History of colonic polyps 08/31/2023   Adenomatous polyp of sigmoid colon 08/19/2023   Adenomatous polyp of descending colon 08/19/2023   Gastritis and gastroduodenitis 08/19/2023   Hardening of the aorta (main artery of the heart) 08/17/2023   Loss of weight 08/04/2023   Iron deficiency anemia due to chronic blood loss 08/04/2023   Intermittent diarrhea 08/04/2023   Alkaline phosphatase elevation 08/04/2023   Vitamin D  deficiency 05/14/2023   COPD (chronic obstructive pulmonary disease) (HCC) 03/31/2023   Acute hypoxic respiratory failure/COPD 03/31/2023   Anemia of chronic disease 03/29/2023   Pelvic fracture (HCC) 03/27/2023   Leukocytosis 03/27/2023   Depression 03/27/2023   Hypothyroidism 03/27/2023   Seizures (HCC) 03/27/2023   Nicotine dependence 06/17/2022   Tachycardia 06/17/2022   Prediabetes 01/10/2022   Impaired fasting glucose 01/08/2022   Nasal congestion 12/25/2021  Headache 10/29/2021   Otalgia of left ear 08/09/2021   Dizziness and giddiness 08/07/2021   Ataxia 07/10/2021   Gastroesophageal reflux disease without esophagitis 07/10/2021   Generalized anxiety disorder 07/10/2021   Hypochloremia 07/10/2021   Mixed hyperlipidemia 07/10/2021   Primary insomnia 07/10/2021   Closed displaced fracture of proximal phalanx of left index finger 07/19/2020   Syncope and collapse 03/20/2013   Hypokalemia 03/20/2013   Hypertension     ONSET DATE: 10/03/24 approximate  REFERRING DIAG:  Diagnosis  T14.8XXA (ICD-10-CM) - Other injury of unspecified body region, initial  encounter    THERAPY DIAG:  Pain in left ankle and joints of left foot  Ankle wound, left, sequela  Rationale for Evaluation and Treatment: Rehabilitation     Wound Therapy - 11/28/24 0001     Subjective Pt states that she has been trying to stay off that side but continues to lie on it as she wakes up on that side.    Patient and Family Stated Goals ankle wound to heal    Date of Onset 10/03/24   approximate   Prior Treatments self care    Pain Scale 0-10    Pain Score 6     Pain Type Acute pain    Pain Location Ankle    Pain Orientation Left;Lateral    Pain Intervention(s) Distraction    Evaluation and Treatment Procedures Explained to Patient/Family Yes    Evaluation and Treatment Procedures agreed to    Wound Properties Date First Assessed: 10/25/24 Time First Assessed: 1310 Present on Original Admission: Yes Primary Wound Type: Other (Comment) Location: Ankle Location Orientation: Left;Lateral   Wound Image Images linked: 2   before and after debridement   Site / Wound Assessment Yellow    Peri-wound Assessment Erythema (blanchable)    Wound Length (cm) 0.5 cm    Wound Width (cm) 0.5 cm    Wound Surface Area (cm^2) 0.2 cm^2    Wound Depth (cm) --   unknown at this time no granulation present.   Drainage Amount None    Treatments Cleansed;Site care   debrided and cut a donut out of foam to attempt to stop pressure at sore when pt lies on her side.   Dressing Type Foam - Lift dressing to assess site every shift;Moist to dry    Dressing Changed Changed    Dressing Status Dry    Wound Therapy - Clinical Statement see below    Wound Therapy - Functional Problem List difficult to sleep    Factors Delaying/Impairing Wound Healing Infection - systemic/local;Immobility;Multiple medical problems;Polypharmacy    Hydrotherapy Plan Debridement;Dressing change;Patient/family education    Wound Therapy - Frequency 2X / week    Wound Therapy - Current Recommendations PT    Wound Plan  debridement and dressing change as indicated.    Dressing  medihoney, 2x2, thicker netting place donut around ankle secured with 3 gauze.             PATIENT EDUCATION: Education details: Keep dressing on until Saturday unless it becomes wet.  Keep wound covered at all times  Person educated: Patient Education method: Explanation Education comprehension: verbalized understanding   HOME EXERCISE PROGRAM: none   GOALS: Goals reviewed with patient? Yes  SHORT TERM GOALS: Target date: 11/08/24  PT pain in left ankle to be decreased to no greater than a 2/10 Baseline: Goal status: IN PROGRESS  2.  PT wound to be 100%granulated Baseline:  Goal status: IN PROGRESS   LONG TERM  GOALS: Target date: 12\24\25   PT pain in left ankle to be 0 Baseline:  Goal status: IN PROGRESS  2.  PT Lt ankle wound to be healed.  Baseline:  Goal status: IN PROGRESS  ASSESSMENT:  CLINICAL IMPRESSION: Pt wound is not healing.  Pt reports waking up on her left side.  Therapist cut a donut out of foam to place on patient's ankle to attempt to relieve pressure off of lateral ankle.  Wound is circular in nature which may indicate pt has some arterial deficits, due to lack of progress therapist  contacted [facility] nurse and left a message  to obtain her input as to if an ABI is warrented at this time.   OBJECTIVE IMPAIRMENTS: pain and decreased skin integrity .   ACTIVITY LIMITATIONS: sleeping, bathing, and dressing  PERSONAL FACTORS: Fitness and Time since onset of injury/illness/exacerbation are also affecting patient's functional outcome.   REHAB POTENTIAL: Good  CLINICAL DECISION MAKING: Stable/uncomplicated  EVALUATION COMPLEXITY: Moderate  PLAN: PT FREQUENCY: 2x/week  PT DURATION: 4 weeks  PLANNED INTERVENTIONS: 97535- Self Care, 02402- Wound care (first 20 sq cm), and Patient/Family education  PLAN FOR NEXT SESSION: Continue with debridement and appropriate dressing.    Weekly measurements and photographs  Montie Metro, PT CLT [phone]  11/28/2024, 2:51 PM   "

## 2024-12-01 ENCOUNTER — Ambulatory Visit (HOSPITAL_COMMUNITY): Attending: Nurse Practitioner | Admitting: Physical Therapy

## 2024-12-01 DIAGNOSIS — M25572 Pain in left ankle and joints of left foot: Secondary | ICD-10-CM | POA: Insufficient documentation

## 2024-12-01 DIAGNOSIS — S91002S Unspecified open wound, left ankle, sequela: Secondary | ICD-10-CM | POA: Diagnosis present

## 2024-12-01 NOTE — Therapy (Signed)
 " OUTPATIENT PHYSICAL THERAPY Wound  Treatment/discharge   Patient Name: Catherine Gonzalez MRN: [id number] DOB:1951/05/30, 74 y.o., female Today's Date: 12/01/2024   PCP: Norleen Hurst REFERRING PROVIDER: Joeann Browning, FNP PHYSICAL THERAPY DISCHARGE SUMMARY  Visits from Start of Care: 7  Current functional level related to goals / functional outcomes: No change in wound   Remaining deficits: Non healing wound    Education / Equipment: Ankle pumps; podus boot for night    Patient agrees to discharge. Patient goals were not met. Patient is being discharged due to lack of progress.  END OF SESSION:  PT End of Session - 12/01/24 1717     Visit Number 7    Number of Visits 7    Date for Recertification  12/01/24    Authorization Type [employer]    Authorization Time Period 11/26-2/24/26    Authorization - Visit Number 7    Authorization - Number of Visits 7    Progress Note Due on Visit 7    PT Start Time 1500    PT Stop Time 1540    PT Time Calculation (min) 40 min    Activity Tolerance Patient limited by pain    Behavior During Therapy WFL for tasks assessed/performed           Past Medical History:  Diagnosis Date   Allergic rhinitis    Anemia    Anxiety    Blood transfusion without reported diagnosis    Depression    Ectopic pregnancy    Finger fracture, left    index   GERD (gastroesophageal reflux disease)    Hepatitis C    Hypertension    Hypothyroidism    Seizures (HCC)    Past Surgical History:  Procedure Laterality Date   APPENDECTOMY     BIOPSY  08/19/2023   Procedure: BIOPSY;  Surgeon: Cinderella Deatrice FALCON, MD;  Location: [facility];  Service: Endoscopy;;   BRAIN SURGERY     x 3   BREAST ENHANCEMENT SURGERY     CHOLECYSTECTOMY     CLOSED REDUCTION FINGER WITH PERCUTANEOUS PINNING Left 07/25/2020   Procedure: CLOSED REDUCTION FINGER WITH PERCUTANEOUS PINNING;  Surgeon: Murrell Kuba, MD;  Location: [facility];  Service: Orthopedics;   Laterality: Left;  AXILLARY BLOCK   COLONOSCOPY WITH PROPOFOL  N/A 08/19/2023   Procedure: COLONOSCOPY WITH PROPOFOL ;  Surgeon: Cinderella Deatrice FALCON, MD;  Location: [facility];  Service: Endoscopy;  Laterality: N/A;  1:00PM;ASA 3   ECTOPIC PREGNANCY SURGERY     ESOPHAGOGASTRODUODENOSCOPY (EGD) WITH PROPOFOL  N/A 08/19/2023   Procedure: ESOPHAGOGASTRODUODENOSCOPY (EGD) WITH PROPOFOL ;  Surgeon: Cinderella Deatrice FALCON, MD;  Location: [facility];  Service: Endoscopy;  Laterality: N/A;  1:00PM;ASA 3   LUMBAR SPINE SURGERY     POLYPECTOMY  08/19/2023   Procedure: POLYPECTOMY;  Surgeon: Cinderella Deatrice FALCON, MD;  Location: [facility];  Service: Endoscopy;;   TONSILLECTOMY AND ADENOIDECTOMY     Patient Active Problem List   Diagnosis Date Noted   Elevated ferritin 11/08/2023   History of colonic polyps 08/31/2023   Adenomatous polyp of sigmoid colon 08/19/2023   Adenomatous polyp of descending colon 08/19/2023   Gastritis and gastroduodenitis 08/19/2023   Hardening of the aorta (main artery of the heart) 08/17/2023   Loss of weight 08/04/2023   Iron deficiency anemia due to chronic blood loss 08/04/2023   Intermittent diarrhea 08/04/2023   Alkaline phosphatase elevation 08/04/2023   Vitamin D  deficiency 05/14/2023   COPD (chronic obstructive  pulmonary disease) (HCC) 03/31/2023   Acute hypoxic respiratory failure/COPD 03/31/2023   Anemia of chronic disease 03/29/2023   Pelvic fracture (HCC) 03/27/2023   Leukocytosis 03/27/2023   Depression 03/27/2023   Hypothyroidism 03/27/2023   Seizures (HCC) 03/27/2023   Nicotine dependence 06/17/2022   Tachycardia 06/17/2022   Prediabetes 01/10/2022   Impaired fasting glucose 01/08/2022   Nasal congestion 12/25/2021   Headache 10/29/2021   Otalgia of left ear 08/09/2021   Dizziness and giddiness 08/07/2021   Ataxia 07/10/2021   Gastroesophageal reflux disease without esophagitis 07/10/2021   Generalized anxiety disorder 07/10/2021   Hypochloremia  07/10/2021   Mixed hyperlipidemia 07/10/2021   Primary insomnia 07/10/2021   Closed displaced fracture of proximal phalanx of left index finger 07/19/2020   Syncope and collapse 03/20/2013   Hypokalemia 03/20/2013   Hypertension     ONSET DATE: 10/03/24 approximate  REFERRING DIAG:  Diagnosis  T14.8XXA (ICD-10-CM) - Other injury of unspecified body region, initial encounter    THERAPY DIAG:  Pain in left ankle and joints of left foot  Ankle wound, left, sequela  Rationale for Evaluation and Treatment: Rehabilitation     Wound Therapy - 12/01/24 0001     Subjective Pt frustrated that her wound is not healing    Patient and Family Stated Goals ankle wound to heal    Date of Onset 10/03/24   approximate   Prior Treatments self care    Evaluation and Treatment Procedures Explained to Patient/Family Yes    Evaluation and Treatment Procedures agreed to    Wound Properties Date First Assessed: 10/25/24 Time First Assessed: 1310 Present on Original Admission: Yes Primary Wound Type: Other (Comment) Location: Ankle Location Orientation: Left;Lateral   Site / Wound Assessment Yellow    Peri-wound Assessment Erythema (blanchable)    Wound Length (cm) 0.5 cm    Wound Width (cm) 0.5 cm    Wound Surface Area (cm^2) 0.2 cm^2    Drainage Amount Small    Treatments Cleansed;Moisturizing cream;Site care;Other (Comment)    Dressing Type Foam - Lift dressing to assess site every shift;Moist to dry    Dressing Changed Changed    Dressing Status Old drainage    Wound Therapy - Clinical Statement see below    Wound Therapy - Functional Problem List difficult to sleep    Factors Delaying/Impairing Wound Healing Infection - systemic/local;Immobility;Multiple medical problems;Polypharmacy    Hydrotherapy Plan Debridement;Dressing change;Patient/family education    Wound Therapy - Frequency 2X / week    Wound Therapy - Current Recommendations PT    Wound Plan discharge due to lack of progress.     Dressing  medihoney, 2x2, thicker netting place donut around ankle secured with 3 gauze.             PATIENT EDUCATION: Education details: Keep dressing on until Saturday unless it becomes wet.  Keep wound covered at all times  Person educated: Patient Education method: Explanation Education comprehension: verbalized understanding   HOME EXERCISE PROGRAM: none   GOALS: Goals reviewed with patient? Yes  SHORT TERM GOALS: Target date: 11/08/24  PT pain in left ankle to be decreased to no greater than a 2/10 Baseline: Goal status: IN PROGRESS  2.  PT wound to be 100%granulated Baseline:  Goal status: IN PROGRESS   LONG TERM GOALS: Target date: 12\24\25   PT pain in left ankle to be 0 Baseline:  Goal status: IN PROGRESS  2.  PT Lt ankle wound to be healed.  Baseline:  Goal status:  IN PROGRESS  ASSESSMENT:  CLINICAL IMPRESSION: Pt wound is not healing.  Therapist assessment is that this is either due to pressure from pt sleeping on her side at night or due to arterial blood flow.  Therapist printed off a picture of a podus boot and instructed the pt to purchase and don prior to going to sleep at night in effort to keep pressure off of her ankle.  Therapist explained that she had left a message for her provider requesting an ABI to assess arterial flow.  Therapist requested pt to return to MD for the next step.  PT will be discharge from therapy due to lack of progress at this time.   OBJECTIVE IMPAIRMENTS: pain and decreased skin integrity .   ACTIVITY LIMITATIONS: sleeping, bathing, and dressing  PERSONAL FACTORS: Fitness and Time since onset of injury/illness/exacerbation are also affecting patient's functional outcome.   REHAB POTENTIAL: Good  CLINICAL DECISION MAKING: Stable/uncomplicated  EVALUATION COMPLEXITY: Moderate  PLAN: PT FREQUENCY: 2x/week  PT DURATION: 4 weeks  PLANNED INTERVENTIONS: 97535- Self Care, 02402- Wound care (first 20 sq cm),  and Patient/Family education  PLAN FOR NEXT SESSION: Discharge.  Montie Metro, PT CLT [phone]  12/01/2024, 5:19 PM   "

## 2024-12-05 ENCOUNTER — Telehealth: Payer: Self-pay

## 2024-12-05 DIAGNOSIS — T148XXA Other injury of unspecified body region, initial encounter: Secondary | ICD-10-CM

## 2024-12-05 NOTE — Telephone Encounter (Signed)
 Refill not appropriate last refill was 09/26/2024

## 2024-12-12 ENCOUNTER — Ambulatory Visit (HOSPITAL_COMMUNITY)
Admission: RE | Admit: 2024-12-12 | Discharge: 2024-12-12 | Disposition: A | Discharge status: Home / Self Care | Source: Ambulatory Visit | Attending: Nurse Practitioner | Admitting: Nurse Practitioner

## 2024-12-12 DIAGNOSIS — T148XXA Other injury of unspecified body region, initial encounter: Secondary | ICD-10-CM | POA: Diagnosis present

## 2024-12-24 ENCOUNTER — Telehealth: Payer: Self-pay

## 2024-12-24 NOTE — Telephone Encounter (Addendum)
 Patient called with no answer. Message left regarding office being closed due to inclement weather and that her appointment has been rescheduled to the next available in August. Patient placed on wait list. Appointment printed and mailed to patient.

## 2024-12-25 ENCOUNTER — Ambulatory Visit: Admitting: Urology

## 2025-01-16 ENCOUNTER — Encounter: Admitting: Vascular Surgery

## 2025-07-09 ENCOUNTER — Ambulatory Visit: Admitting: Urology
